# Patient Record
Sex: Female | Born: 1950 | Race: White | Hispanic: No | Smoking: Former smoker
Health system: Southern US, Community
[De-identification: ages and names within clinical notes are randomized; demographics above are authoritative.]

## PROBLEM LIST (undated history)

## (undated) DIAGNOSIS — R0602 Shortness of breath: Secondary | ICD-10-CM

## (undated) DIAGNOSIS — Z9221 Personal history of antineoplastic chemotherapy: Secondary | ICD-10-CM

## (undated) DIAGNOSIS — C3490 Malignant neoplasm of unspecified part of unspecified bronchus or lung: Secondary | ICD-10-CM

## (undated) DIAGNOSIS — R490 Dysphonia: Secondary | ICD-10-CM

## (undated) DIAGNOSIS — J449 Chronic obstructive pulmonary disease, unspecified: Secondary | ICD-10-CM

## (undated) DIAGNOSIS — R339 Retention of urine, unspecified: Secondary | ICD-10-CM

## (undated) DIAGNOSIS — K219 Gastro-esophageal reflux disease without esophagitis: Secondary | ICD-10-CM

## (undated) DIAGNOSIS — J189 Pneumonia, unspecified organism: Secondary | ICD-10-CM

## (undated) DIAGNOSIS — Z923 Personal history of irradiation: Secondary | ICD-10-CM

## (undated) HISTORY — DX: Personal history of antineoplastic chemotherapy: Z92.21

## (undated) HISTORY — DX: Personal history of irradiation: Z92.3

## (undated) HISTORY — PX: ABDOMINAL HYSTERECTOMY: SHX81

## (undated) HISTORY — DX: Chronic obstructive pulmonary disease, unspecified: J44.9

## (undated) HISTORY — PX: OTHER SURGICAL HISTORY: SHX169

---

## 1999-03-08 ENCOUNTER — Other Ambulatory Visit: Admission: RE | Admit: 1999-03-08 | Discharge: 1999-03-08 | Payer: Self-pay | Admitting: Gynecology

## 2001-02-12 ENCOUNTER — Encounter: Admission: RE | Admit: 2001-02-12 | Discharge: 2001-02-12 | Payer: Self-pay | Admitting: *Deleted

## 2001-04-17 ENCOUNTER — Other Ambulatory Visit: Admission: RE | Admit: 2001-04-17 | Discharge: 2001-04-17 | Payer: Self-pay | Admitting: Gynecology

## 2003-01-24 ENCOUNTER — Other Ambulatory Visit: Admission: RE | Admit: 2003-01-24 | Discharge: 2003-01-24 | Payer: Self-pay | Admitting: Gynecology

## 2003-01-30 ENCOUNTER — Encounter: Admission: RE | Admit: 2003-01-30 | Discharge: 2003-01-30 | Payer: Self-pay | Admitting: Gynecology

## 2003-01-30 ENCOUNTER — Encounter: Payer: Self-pay | Admitting: Gynecology

## 2009-03-25 ENCOUNTER — Encounter: Payer: Self-pay | Admitting: Internal Medicine

## 2009-04-03 ENCOUNTER — Ambulatory Visit: Payer: Self-pay | Admitting: Internal Medicine

## 2009-04-03 DIAGNOSIS — J449 Chronic obstructive pulmonary disease, unspecified: Secondary | ICD-10-CM

## 2009-04-03 DIAGNOSIS — R05 Cough: Secondary | ICD-10-CM

## 2009-04-03 DIAGNOSIS — J4489 Other specified chronic obstructive pulmonary disease: Secondary | ICD-10-CM | POA: Insufficient documentation

## 2009-07-27 ENCOUNTER — Encounter (INDEPENDENT_AMBULATORY_CARE_PROVIDER_SITE_OTHER): Payer: Self-pay | Admitting: *Deleted

## 2009-07-31 ENCOUNTER — Ambulatory Visit: Payer: Self-pay | Admitting: Internal Medicine

## 2009-08-04 ENCOUNTER — Telehealth: Payer: Self-pay | Admitting: Internal Medicine

## 2009-08-10 ENCOUNTER — Ambulatory Visit: Payer: Self-pay | Admitting: Internal Medicine

## 2009-08-10 ENCOUNTER — Encounter: Payer: Self-pay | Admitting: Internal Medicine

## 2009-08-11 ENCOUNTER — Encounter: Payer: Self-pay | Admitting: Internal Medicine

## 2009-09-21 ENCOUNTER — Encounter: Payer: Self-pay | Admitting: Internal Medicine

## 2009-09-22 ENCOUNTER — Ambulatory Visit (HOSPITAL_COMMUNITY): Admission: RE | Admit: 2009-09-22 | Discharge: 2009-09-22 | Payer: Self-pay | Admitting: Internal Medicine

## 2009-09-22 ENCOUNTER — Ambulatory Visit: Payer: Self-pay | Admitting: Internal Medicine

## 2009-09-28 ENCOUNTER — Ambulatory Visit: Payer: Self-pay | Admitting: Internal Medicine

## 2009-09-28 DIAGNOSIS — R222 Localized swelling, mass and lump, trunk: Secondary | ICD-10-CM | POA: Insufficient documentation

## 2009-09-30 ENCOUNTER — Ambulatory Visit: Payer: Self-pay | Admitting: Internal Medicine

## 2009-10-01 ENCOUNTER — Ambulatory Visit (HOSPITAL_COMMUNITY): Admission: RE | Admit: 2009-10-01 | Discharge: 2009-10-01 | Payer: Self-pay | Admitting: Internal Medicine

## 2009-10-05 LAB — COMPREHENSIVE METABOLIC PANEL
ALT: 17 U/L (ref 0–35)
AST: 16 U/L (ref 0–37)
Alkaline Phosphatase: 132 U/L — ABNORMAL HIGH (ref 39–117)
Calcium: 8.5 mg/dL (ref 8.4–10.5)
Chloride: 101 mEq/L (ref 96–112)
Sodium: 139 mEq/L (ref 135–145)

## 2009-10-05 LAB — CBC WITH DIFFERENTIAL/PLATELET
BASO%: 0.3 % (ref 0.0–2.0)
EOS%: 1.1 % (ref 0.0–7.0)
Eosinophils Absolute: 0.1 10*3/uL (ref 0.0–0.5)
MCV: 82.8 fL (ref 79.5–101.0)
MONO#: 0.7 10*3/uL (ref 0.1–0.9)
NEUT#: 6.4 10*3/uL (ref 1.5–6.5)
Platelets: 397 10*3/uL (ref 145–400)
RBC: 3.73 10*6/uL (ref 3.70–5.45)

## 2009-10-06 ENCOUNTER — Ambulatory Visit: Admission: RE | Admit: 2009-10-06 | Discharge: 2009-11-06 | Payer: Self-pay | Admitting: Radiation Oncology

## 2009-10-07 ENCOUNTER — Ambulatory Visit (HOSPITAL_COMMUNITY): Admission: RE | Admit: 2009-10-07 | Discharge: 2009-10-07 | Payer: Self-pay | Admitting: Internal Medicine

## 2009-10-19 LAB — CBC WITH DIFFERENTIAL/PLATELET
Basophils Absolute: 0 10*3/uL (ref 0.0–0.1)
EOS%: 1.7 % (ref 0.0–7.0)
Eosinophils Absolute: 0.1 10*3/uL (ref 0.0–0.5)
HGB: 10.5 g/dL — ABNORMAL LOW (ref 11.6–15.9)
LYMPH%: 19.2 % (ref 14.0–49.7)
MCH: 27.7 pg (ref 25.1–34.0)
MCHC: 34.2 g/dL (ref 31.5–36.0)
MONO#: 0.4 10*3/uL (ref 0.1–0.9)
NEUT#: 4.7 10*3/uL (ref 1.5–6.5)
NEUT%: 72.4 % (ref 38.4–76.8)
RBC: 3.79 10*6/uL (ref 3.70–5.45)
RDW: 13.2 % (ref 11.2–14.5)

## 2009-10-19 LAB — COMPREHENSIVE METABOLIC PANEL
Albumin: 3.4 g/dL — ABNORMAL LOW (ref 3.5–5.2)
Alkaline Phosphatase: 143 U/L — ABNORMAL HIGH (ref 39–117)
CO2: 28 mEq/L (ref 19–32)
Chloride: 101 mEq/L (ref 96–112)
Total Protein: 7.8 g/dL (ref 6.0–8.3)

## 2009-10-26 LAB — CBC WITH DIFFERENTIAL/PLATELET
BASO%: 0.2 % (ref 0.0–2.0)
Eosinophils Absolute: 0.1 10*3/uL (ref 0.0–0.5)
HGB: 10.7 g/dL — ABNORMAL LOW (ref 11.6–15.9)
MCV: 79.9 fL (ref 79.5–101.0)
MONO#: 0.5 10*3/uL (ref 0.1–0.9)
NEUT#: 4.8 10*3/uL (ref 1.5–6.5)
Platelets: 244 10*3/uL (ref 145–400)
RDW: 13.2 % (ref 11.2–14.5)
WBC: 6.1 10*3/uL (ref 3.9–10.3)

## 2009-11-05 ENCOUNTER — Ambulatory Visit: Payer: Self-pay | Admitting: Internal Medicine

## 2009-11-09 LAB — CBC WITH DIFFERENTIAL/PLATELET
Eosinophils Absolute: 0.1 10*3/uL (ref 0.0–0.5)
HCT: 33.2 % — ABNORMAL LOW (ref 34.8–46.6)
HGB: 11.3 g/dL — ABNORMAL LOW (ref 11.6–15.9)
MCV: 81.1 fL (ref 79.5–101.0)
MONO#: 0.4 10*3/uL (ref 0.1–0.9)
MONO%: 10.1 % (ref 0.0–14.0)
NEUT#: 3.1 10*3/uL (ref 1.5–6.5)
Platelets: 212 10*3/uL (ref 145–400)
WBC: 4 10*3/uL (ref 3.9–10.3)

## 2009-11-09 LAB — COMPREHENSIVE METABOLIC PANEL
Albumin: 3.6 g/dL (ref 3.5–5.2)
Alkaline Phosphatase: 146 U/L — ABNORMAL HIGH (ref 39–117)
BUN: 15 mg/dL (ref 6–23)
CO2: 22 mEq/L (ref 19–32)
Creatinine, Ser: 0.79 mg/dL (ref 0.40–1.20)
Glucose, Bld: 83 mg/dL (ref 70–99)
Total Bilirubin: 0.3 mg/dL (ref 0.3–1.2)
Total Protein: 7 g/dL (ref 6.0–8.3)

## 2009-11-11 ENCOUNTER — Encounter: Payer: Self-pay | Admitting: Internal Medicine

## 2009-11-23 LAB — CBC WITH DIFFERENTIAL/PLATELET
Basophils Absolute: 0 10*3/uL (ref 0.0–0.1)
Eosinophils Absolute: 0.1 10*3/uL (ref 0.0–0.5)
HCT: 32.5 % — ABNORMAL LOW (ref 34.8–46.6)
HGB: 11.2 g/dL — ABNORMAL LOW (ref 11.6–15.9)
MCV: 81.7 fL (ref 79.5–101.0)
MONO%: 10.8 % (ref 0.0–14.0)
NEUT#: 2.8 10*3/uL (ref 1.5–6.5)
RDW: 16.5 % — ABNORMAL HIGH (ref 11.2–14.5)

## 2009-11-23 LAB — COMPREHENSIVE METABOLIC PANEL
Albumin: 3.3 g/dL — ABNORMAL LOW (ref 3.5–5.2)
BUN: 13 mg/dL (ref 6–23)
Calcium: 9.1 mg/dL (ref 8.4–10.5)
Chloride: 105 mEq/L (ref 96–112)
Glucose, Bld: 98 mg/dL (ref 70–99)
Potassium: 3.7 mEq/L (ref 3.5–5.3)

## 2009-12-16 ENCOUNTER — Ambulatory Visit (HOSPITAL_COMMUNITY): Admission: RE | Admit: 2009-12-16 | Discharge: 2009-12-16 | Payer: Self-pay | Admitting: Internal Medicine

## 2009-12-17 ENCOUNTER — Ambulatory Visit: Payer: Self-pay | Admitting: Internal Medicine

## 2009-12-21 LAB — COMPREHENSIVE METABOLIC PANEL
ALT: 19 U/L (ref 0–35)
AST: 21 U/L (ref 0–37)
Albumin: 4.1 g/dL (ref 3.5–5.2)
Alkaline Phosphatase: 180 U/L — ABNORMAL HIGH (ref 39–117)
BUN: 17 mg/dL (ref 6–23)
CO2: 24 mEq/L (ref 19–32)
Chloride: 101 mEq/L (ref 96–112)
Creatinine, Ser: 0.85 mg/dL (ref 0.40–1.20)
Glucose, Bld: 86 mg/dL (ref 70–99)
Potassium: 4.4 mEq/L (ref 3.5–5.3)
Sodium: 137 mEq/L (ref 135–145)
Total Bilirubin: 0.4 mg/dL (ref 0.3–1.2)

## 2009-12-21 LAB — CBC WITH DIFFERENTIAL/PLATELET
BASO%: 0.3 % (ref 0.0–2.0)
HCT: 34.2 % — ABNORMAL LOW (ref 34.8–46.6)
MCH: 28.5 pg (ref 25.1–34.0)
MCHC: 34.5 g/dL (ref 31.5–36.0)
NEUT#: 3 10*3/uL (ref 1.5–6.5)
Platelets: 230 10*3/uL (ref 145–400)
RBC: 4.14 10*6/uL (ref 3.70–5.45)
RDW: 16.3 % — ABNORMAL HIGH (ref 11.2–14.5)

## 2010-01-18 ENCOUNTER — Ambulatory Visit: Payer: Self-pay | Admitting: Internal Medicine

## 2010-01-20 LAB — CBC WITH DIFFERENTIAL/PLATELET
BASO%: 0.3 % (ref 0.0–2.0)
Basophils Absolute: 0 10*3/uL (ref 0.0–0.1)
EOS%: 3.2 % (ref 0.0–7.0)
HGB: 12.2 g/dL (ref 11.6–15.9)
LYMPH%: 24.2 % (ref 14.0–49.7)
MCV: 84.4 fL (ref 79.5–101.0)
MONO%: 9.2 % (ref 0.0–14.0)
RDW: 15.5 % — ABNORMAL HIGH (ref 11.2–14.5)

## 2010-01-20 LAB — COMPREHENSIVE METABOLIC PANEL
ALT: 19 U/L (ref 0–35)
AST: 23 U/L (ref 0–37)
Albumin: 4.1 g/dL (ref 3.5–5.2)
Alkaline Phosphatase: 180 U/L — ABNORMAL HIGH (ref 39–117)
Calcium: 9.2 mg/dL (ref 8.4–10.5)
Creatinine, Ser: 0.89 mg/dL (ref 0.40–1.20)
Total Protein: 7 g/dL (ref 6.0–8.3)

## 2010-02-17 ENCOUNTER — Ambulatory Visit: Payer: Self-pay | Admitting: Internal Medicine

## 2010-02-17 LAB — COMPREHENSIVE METABOLIC PANEL
ALT: 25 U/L (ref 0–35)
AST: 30 U/L (ref 0–37)
Alkaline Phosphatase: 204 U/L — ABNORMAL HIGH (ref 39–117)
BUN: 12 mg/dL (ref 6–23)
Chloride: 103 mEq/L (ref 96–112)
Creatinine, Ser: 0.8 mg/dL (ref 0.40–1.20)
Glucose, Bld: 87 mg/dL (ref 70–99)
Potassium: 4.1 mEq/L (ref 3.5–5.3)
Sodium: 139 mEq/L (ref 135–145)
Total Bilirubin: 0.4 mg/dL (ref 0.3–1.2)

## 2010-02-17 LAB — CBC WITH DIFFERENTIAL/PLATELET
Basophils Absolute: 0 10*3/uL (ref 0.0–0.1)
Eosinophils Absolute: 0.1 10*3/uL (ref 0.0–0.5)
LYMPH%: 22.1 % (ref 14.0–49.7)
MCH: 29.2 pg (ref 25.1–34.0)
MCHC: 34.7 g/dL (ref 31.5–36.0)
MCV: 84.2 fL (ref 79.5–101.0)
NEUT%: 67.1 % (ref 38.4–76.8)
RDW: 14.4 % (ref 11.2–14.5)
WBC: 4.4 10*3/uL (ref 3.9–10.3)

## 2010-03-15 ENCOUNTER — Ambulatory Visit (HOSPITAL_COMMUNITY): Admission: RE | Admit: 2010-03-15 | Discharge: 2010-03-15 | Payer: Self-pay | Admitting: Internal Medicine

## 2010-03-18 ENCOUNTER — Ambulatory Visit: Admission: RE | Admit: 2010-03-18 | Discharge: 2010-05-27 | Payer: Self-pay | Admitting: Radiation Oncology

## 2010-03-18 LAB — COMPREHENSIVE METABOLIC PANEL
Albumin: 4.1 g/dL (ref 3.5–5.2)
Alkaline Phosphatase: 206 U/L — ABNORMAL HIGH (ref 39–117)
CO2: 23 mEq/L (ref 19–32)
Calcium: 8.5 mg/dL (ref 8.4–10.5)
Chloride: 102 mEq/L (ref 96–112)
Creatinine, Ser: 0.82 mg/dL (ref 0.40–1.20)
Potassium: 4.7 mEq/L (ref 3.5–5.3)
Total Bilirubin: 0.5 mg/dL (ref 0.3–1.2)

## 2010-03-18 LAB — CBC WITH DIFFERENTIAL/PLATELET
BASO%: 0.2 % (ref 0.0–2.0)
EOS%: 1.8 % (ref 0.0–7.0)
MCV: 85.5 fL (ref 79.5–101.0)
MONO%: 11 % (ref 0.0–14.0)
NEUT#: 3.6 10*3/uL (ref 1.5–6.5)
NEUT%: 70.3 % (ref 38.4–76.8)
Platelets: 228 10*3/uL (ref 145–400)
RBC: 4.19 10*6/uL (ref 3.70–5.45)
RDW: 13.9 % (ref 11.2–14.5)
lymph#: 0.9 10*3/uL (ref 0.9–3.3)

## 2010-03-24 ENCOUNTER — Ambulatory Visit: Payer: Self-pay | Admitting: Internal Medicine

## 2010-03-30 ENCOUNTER — Ambulatory Visit: Admission: RE | Admit: 2010-03-30 | Discharge: 2010-03-30 | Payer: Self-pay | Admitting: Internal Medicine

## 2010-03-30 ENCOUNTER — Ambulatory Visit: Payer: Self-pay | Admitting: Internal Medicine

## 2010-04-13 ENCOUNTER — Ambulatory Visit: Payer: Self-pay | Admitting: Internal Medicine

## 2010-04-15 LAB — COMPREHENSIVE METABOLIC PANEL
Albumin: 4.1 g/dL (ref 3.5–5.2)
Alkaline Phosphatase: 200 U/L — ABNORMAL HIGH (ref 39–117)
BUN: 17 mg/dL (ref 6–23)
Creatinine, Ser: 0.86 mg/dL (ref 0.40–1.20)
Total Bilirubin: 0.6 mg/dL (ref 0.3–1.2)
Total Protein: 6.9 g/dL (ref 6.0–8.3)

## 2010-04-15 LAB — CBC WITH DIFFERENTIAL/PLATELET
Basophils Absolute: 0 10*3/uL (ref 0.0–0.1)
EOS%: 2.1 % (ref 0.0–7.0)
HCT: 36.4 % (ref 34.8–46.6)
MCH: 29.3 pg (ref 25.1–34.0)
MCV: 86.7 fL (ref 79.5–101.0)
MONO#: 0.5 10*3/uL (ref 0.1–0.9)
NEUT%: 68.9 % (ref 38.4–76.8)
Platelets: 236 10*3/uL (ref 145–400)
RDW: 13.4 % (ref 11.2–14.5)
lymph#: 0.9 10*3/uL (ref 0.9–3.3)

## 2010-05-13 ENCOUNTER — Ambulatory Visit: Payer: Self-pay | Admitting: Internal Medicine

## 2010-05-13 LAB — CBC WITH DIFFERENTIAL/PLATELET
Basophils Absolute: 0 10*3/uL (ref 0.0–0.1)
EOS%: 2.3 % (ref 0.0–7.0)
Eosinophils Absolute: 0.1 10*3/uL (ref 0.0–0.5)
LYMPH%: 20.7 % (ref 14.0–49.7)
MCH: 30.2 pg (ref 25.1–34.0)
MCV: 87.1 fL (ref 79.5–101.0)
MONO#: 0.4 10*3/uL (ref 0.1–0.9)
NEUT#: 3.2 10*3/uL (ref 1.5–6.5)
Platelets: 243 10*3/uL (ref 145–400)
RDW: 13.1 % (ref 11.2–14.5)
lymph#: 1 10*3/uL (ref 0.9–3.3)

## 2010-05-13 LAB — COMPREHENSIVE METABOLIC PANEL
ALT: 20 U/L (ref 0–35)
AST: 24 U/L (ref 0–37)
Albumin: 4.5 g/dL (ref 3.5–5.2)
Alkaline Phosphatase: 227 U/L — ABNORMAL HIGH (ref 39–117)
CO2: 20 mEq/L (ref 19–32)
Chloride: 104 mEq/L (ref 96–112)
Total Bilirubin: 0.5 mg/dL (ref 0.3–1.2)

## 2010-06-11 ENCOUNTER — Ambulatory Visit (HOSPITAL_COMMUNITY): Admission: RE | Admit: 2010-06-11 | Discharge: 2010-06-11 | Payer: Self-pay | Admitting: Internal Medicine

## 2010-06-15 ENCOUNTER — Ambulatory Visit: Payer: Self-pay | Admitting: Internal Medicine

## 2010-06-15 LAB — CBC WITH DIFFERENTIAL/PLATELET
BASO%: 0.3 % (ref 0.0–2.0)
Basophils Absolute: 0 10e3/uL (ref 0.0–0.1)
EOS%: 1.8 % (ref 0.0–7.0)
Eosinophils Absolute: 0.1 10e3/uL (ref 0.0–0.5)
HCT: 39.6 % (ref 34.8–46.6)
HGB: 13.4 g/dL (ref 11.6–15.9)
LYMPH%: 20.4 % (ref 14.0–49.7)
MCH: 30 pg (ref 25.1–34.0)
MCHC: 33.9 g/dL (ref 31.5–36.0)
MCV: 88.5 fL (ref 79.5–101.0)
MONO#: 0.4 10e3/uL (ref 0.1–0.9)
MONO%: 9.4 % (ref 0.0–14.0)
NEUT#: 3.2 10e3/uL (ref 1.5–6.5)
NEUT%: 68.1 % (ref 38.4–76.8)
Platelets: 210 10e3/uL (ref 145–400)
RBC: 4.47 10e6/uL (ref 3.70–5.45)
RDW: 13.2 % (ref 11.2–14.5)
WBC: 4.7 10e3/uL (ref 3.9–10.3)
lymph#: 1 10e3/uL (ref 0.9–3.3)

## 2010-06-15 LAB — COMPREHENSIVE METABOLIC PANEL
ALT: 16 U/L (ref 0–35)
AST: 20 U/L (ref 0–37)
Albumin: 4.1 g/dL (ref 3.5–5.2)
Glucose, Bld: 93 mg/dL (ref 70–99)
Total Protein: 7 g/dL (ref 6.0–8.3)

## 2010-07-14 LAB — CBC WITH DIFFERENTIAL/PLATELET
Eosinophils Absolute: 0.1 10*3/uL (ref 0.0–0.5)
HGB: 12.5 g/dL (ref 11.6–15.9)
LYMPH%: 15.9 % (ref 14.0–49.7)
MCH: 30.6 pg (ref 25.1–34.0)
MCHC: 35.1 g/dL (ref 31.5–36.0)
MCV: 87.2 fL (ref 79.5–101.0)
MONO#: 0.4 10*3/uL (ref 0.1–0.9)
MONO%: 7.3 % (ref 0.0–14.0)
NEUT#: 3.7 10*3/uL (ref 1.5–6.5)
NEUT%: 74.9 % (ref 38.4–76.8)
RBC: 4.07 10*6/uL (ref 3.70–5.45)

## 2010-07-14 LAB — COMPREHENSIVE METABOLIC PANEL
ALT: 17 U/L (ref 0–35)
Alkaline Phosphatase: 201 U/L — ABNORMAL HIGH (ref 39–117)
Total Bilirubin: 0.3 mg/dL (ref 0.3–1.2)
Total Protein: 6.7 g/dL (ref 6.0–8.3)

## 2010-08-11 ENCOUNTER — Encounter: Payer: Self-pay | Admitting: Internal Medicine

## 2010-08-11 ENCOUNTER — Other Ambulatory Visit: Payer: Self-pay | Admitting: Internal Medicine

## 2010-08-11 LAB — CBC WITH DIFFERENTIAL/PLATELET
Basophils Absolute: 0 10*3/uL (ref 0.0–0.1)
Eosinophils Absolute: 0.1 10*3/uL (ref 0.0–0.5)
HGB: 12.9 g/dL (ref 11.6–15.9)
NEUT#: 8.3 10*3/uL — ABNORMAL HIGH (ref 1.5–6.5)
RDW: 12.7 % (ref 11.2–14.5)
lymph#: 0.8 10*3/uL — ABNORMAL LOW (ref 0.9–3.3)

## 2010-08-11 LAB — COMPREHENSIVE METABOLIC PANEL
AST: 27 U/L (ref 0–37)
Albumin: 4.3 g/dL (ref 3.5–5.2)
BUN: 15 mg/dL (ref 6–23)
Calcium: 9.4 mg/dL (ref 8.4–10.5)
Chloride: 100 mEq/L (ref 96–112)
Glucose, Bld: 96 mg/dL (ref 70–99)
Potassium: 4.3 mEq/L (ref 3.5–5.3)

## 2010-09-02 ENCOUNTER — Ambulatory Visit (HOSPITAL_COMMUNITY)
Admission: RE | Admit: 2010-09-02 | Discharge: 2010-09-02 | Payer: Self-pay | Source: Home / Self Care | Admitting: Internal Medicine

## 2010-09-06 ENCOUNTER — Other Ambulatory Visit: Payer: Self-pay | Admitting: Internal Medicine

## 2010-09-06 ENCOUNTER — Encounter: Payer: Self-pay | Admitting: Internal Medicine

## 2010-09-06 LAB — COMPREHENSIVE METABOLIC PANEL
ALT: 22 U/L (ref 0–35)
AST: 28 U/L (ref 0–37)
Albumin: 3.6 g/dL (ref 3.5–5.2)
BUN: 12 mg/dL (ref 6–23)
Calcium: 9.3 mg/dL (ref 8.4–10.5)
Chloride: 104 mEq/L (ref 96–112)
Potassium: 4 mEq/L (ref 3.5–5.3)
Sodium: 140 mEq/L (ref 135–145)
Total Protein: 7 g/dL (ref 6.0–8.3)

## 2010-09-06 LAB — CBC WITH DIFFERENTIAL/PLATELET
Basophils Absolute: 0 10*3/uL (ref 0.0–0.1)
EOS%: 1.3 % (ref 0.0–7.0)
HGB: 13.4 g/dL (ref 11.6–15.9)
MCH: 30 pg (ref 25.1–34.0)
NEUT#: 2.9 10*3/uL (ref 1.5–6.5)
RBC: 4.48 10*6/uL (ref 3.70–5.45)
RDW: 12.7 % (ref 11.2–14.5)
lymph#: 1 10*3/uL (ref 0.9–3.3)

## 2010-10-07 ENCOUNTER — Ambulatory Visit: Payer: Self-pay | Admitting: Internal Medicine

## 2010-10-11 LAB — CBC WITH DIFFERENTIAL/PLATELET
BASO%: 0.3 % (ref 0.0–2.0)
Basophils Absolute: 0 10*3/uL (ref 0.0–0.1)
EOS%: 1 % (ref 0.0–7.0)
Eosinophils Absolute: 0 10*3/uL (ref 0.0–0.5)
HCT: 36.6 % (ref 34.8–46.6)
HGB: 12.5 g/dL (ref 11.6–15.9)
LYMPH%: 22.8 % (ref 14.0–49.7)
MCH: 29.9 pg (ref 25.1–34.0)
MCHC: 34.2 g/dL (ref 31.5–36.0)
MCV: 87.4 fL (ref 79.5–101.0)
MONO#: 0.3 10*3/uL (ref 0.1–0.9)
MONO%: 7.4 % (ref 0.0–14.0)
NEUT#: 3 10*3/uL (ref 1.5–6.5)
NEUT%: 68.5 % (ref 38.4–76.8)
Platelets: 234 10*3/uL (ref 145–400)
RBC: 4.19 10*6/uL (ref 3.70–5.45)
RDW: 13.3 % (ref 11.2–14.5)
WBC: 4.4 10*3/uL (ref 3.9–10.3)
lymph#: 1 10*3/uL (ref 0.9–3.3)

## 2010-10-11 LAB — COMPREHENSIVE METABOLIC PANEL
ALT: 18 U/L (ref 0–35)
AST: 27 U/L (ref 0–37)
Albumin: 4.4 g/dL (ref 3.5–5.2)
Alkaline Phosphatase: 209 U/L — ABNORMAL HIGH (ref 39–117)
BUN: 14 mg/dL (ref 6–23)
CO2: 22 mEq/L (ref 19–32)
Calcium: 9.4 mg/dL (ref 8.4–10.5)
Chloride: 104 mEq/L (ref 96–112)
Creatinine, Ser: 0.89 mg/dL (ref 0.40–1.20)
Glucose, Bld: 82 mg/dL (ref 70–99)
Potassium: 4.4 mEq/L (ref 3.5–5.3)
Sodium: 138 mEq/L (ref 135–145)
Total Bilirubin: 0.5 mg/dL (ref 0.3–1.2)
Total Protein: 7.4 g/dL (ref 6.0–8.3)

## 2010-11-02 NOTE — Letter (Signed)
Summary: [facility]  [facility]   Imported By: Lennie Odor 08/27/2010 13:42:56  _____________________________________________________________________  External Attachment:    Type:   Image     Comment:   External Document

## 2010-11-02 NOTE — Op Note (Signed)
Summary: [facility]  [facility]   Imported By: Sherian Rein 11/30/2009 09:15:05  _____________________________________________________________________  External Attachment:    Type:   Image     Comment:   External Document

## 2010-11-02 NOTE — Assessment & Plan Note (Signed)
Summary: Pulmonary/ ext f/u ov   Copy to:  Dr. Lazarus Salines Primary Provider/Referring Provider:  Dr. Creola Corn  CC:  Followup per Dr Michell Heinrich.  Pt had CT Chest x 1 wk ago which showed rt pneumothorax.  Pt states that she has overall been doing well with her breathing.  She does have some dry cough in the am- sometimes produces minimal clear sputum.  Marland Kitchen  History of Present Illness: 27 yowf with minimal smoking quit quit 2002 with cough that resolved and no limiting sob  Onset Oct 2009 cough rattling in chest, no sputum production  then seen Nov with cxr nl then ENT eval by [facility] including sinus ct all normal and rec ppi two times a day ac one week before first ov April 03, 2009 with ? slt improvment.   Main c/o @ ov = sense of throat congestion occurs about 1 hour after meals, worse when supine, occ with cough, dry then wakes up with rattling around 230am. assoc with episodic sob not necessarily proportionate to activity.  Cxr 01/2009 HP wnl so rx as GERD with f/u if nott 100% > states cough resolved and did not feel further pulmonary eval needed  Then indolent onset worsening R lower ant cp in October 2010 evetually sought Dr Christena Deem help and dx'd with right lung mass/effusion > FOB 12/21 pos adenoca with 50% obstruction.  September 28, 2009 ov for discussion of FOB findings.  Main  c/o is right ant cp with deep breath > rec RT and onc eval (see pmhx for outline of rx)  March 24, 2010 ov with no more cp but doe x yardwork or long walks (up to 3 miles without stopping) no cough or am congestion.  Pt denies any significant sore throat, dysphagia, itching, sneezing,  nasal congestion or excess secretions,  fever, chills, sweats, unintended wt loss, pleuritic or exertional cp, hempoptysis, change in activity tolerance  orthopnea pnd or leg swelling. Pt also denies any obvious fluctuation in symptoms with weather or environmental change or other alleviating or aggravating factors.           Current  Medications (verified): 1)  Famotidine 10 Mg Tabs (Famotidine) .Marland Kitchen.. 1 Once Daily 2)  Ibuprofen 200 Mg Tabs (Ibuprofen) .... As Directed On Bottle As Needed 3)  Alprazolam 0.5 Mg Tabs (Alprazolam) .... 1/2 To 1 Two Times A Day As Needed 4)  Colace 100 Mg Caps (Docusate Sodium) .... 2 Once Daily 5)  Ferrous Sulfate 325 (65 Fe) Mg Tabs (Ferrous Sulfate) .... 2 Once Daily  Allergies (verified): 1)  ! Sulfa  Past History:  Past Medical History: COPD per [place] 2006 Lung Mass with Right Pleural effusion     - FOB 09/22/09 concentric 50% obstruction rmsb pos adenoca     - PET ordered September 28, 2009      - RT complete Jan 2011     - Maint Tarceva Oct 30 2009 >>>  Family History: Reviewed history from 04/03/2009 and no changes required. Colon CA- Father  Social History: Reviewed history from 04/03/2009 and no changes required. Divorced Designer, television/film set ETOH occ Former smoker.  Quit in 2002.  Smoked 1/2 ppd x 4 years.  Vital Signs:  Patient profile:   60 year old female Weight:      176 pounds BMI:     30.32 O2 Sat:      100 % on Room air Temp:     97.1 degrees F oral Pulse rate:   97 /  minute BP sitting:   120 / 80  (left arm)  Vitals Entered By: Vernie Murders (March 24, 2010 2:10 PM)  O2 Flow:  Room air  Physical Exam  Additional Exam:  wt 186  April 03 2009 > 174 September 28, 2009 > 176 March 25, 2010  amb wf nad HEENT: nl dentition, turbinates, and orophanx. Nl external ear canals without cough reflex NECK :  without JVD/Nodes/TM/ nl carotid upstrokes bilaterally LUNGS: no acc muscle use,  minimal decrease bs right , no localized wheeze  without cough on insp or exp maneuvers CV:  RRR  no s3 or murmur or increase in P2, no edema  ABD:  soft and nontender with nl excursion in the supine position. No bruits or organomegaly, bowel sounds nl pos hoover's at end insp MS:  warm without deformities, calf tenderness, cyanosis or clubbing SKIN: warm and dry without  lesions   NEURO:  alert, approp, no deficits     CT of Chest  Procedure date:  03/15/2010  Findings:      complete atx right lung  Impression & Recommendations:  Problem # 1:  SWELLING/MASS/LUMP IN CHEST (ICD-786.6)  The minimal symptoms she's having suggest to me this is not an acute obstruction and it well not be possible to re-inflate the right lung at this point regardless of findings but ddx includes recurrent tumor vs rad fibrosis, doubt mucus plug.  Discussed in detail all the  indications, usual  risks and alternatives  relative to the benefits with patient who agrees to proceed with bronchoscopy with biopsy if needed  Orders: Est. Patient Level IV ([address])  Medications Added to Medication List This Visit: 1)  Famotidine 10 Mg Tabs (Famotidine) .Marland Kitchen.. 1 once daily 2)  Colace 100 Mg Caps (Docusate sodium) .... 2 once daily 3)  Ferrous Sulfate 325 (65 Fe) Mg Tabs (Ferrous sulfate) .... 2 once daily  Patient Instructions: 1)  Bronchoscopy sceduled for Tuesday at 830 so nothing eat or drink after midnight on Monday and go to outpatient registration at 730 am

## 2010-11-04 NOTE — Letter (Signed)
Summary: [facility]  [facility]   Imported By: Lester [place] 09/14/2010 10:24:05  _____________________________________________________________________  External Attachment:    Type:   Image     Comment:   External Document

## 2010-11-09 ENCOUNTER — Other Ambulatory Visit: Payer: Self-pay | Admitting: Internal Medicine

## 2010-11-09 ENCOUNTER — Encounter (HOSPITAL_BASED_OUTPATIENT_CLINIC_OR_DEPARTMENT_OTHER): Payer: Medicare HMO | Admitting: Internal Medicine

## 2010-11-09 DIAGNOSIS — C3430 Malignant neoplasm of lower lobe, unspecified bronchus or lung: Secondary | ICD-10-CM

## 2010-11-09 DIAGNOSIS — Z85118 Personal history of other malignant neoplasm of bronchus and lung: Secondary | ICD-10-CM

## 2010-11-09 DIAGNOSIS — K5900 Constipation, unspecified: Secondary | ICD-10-CM

## 2010-11-09 LAB — COMPREHENSIVE METABOLIC PANEL
AST: 28 U/L (ref 0–37)
Albumin: 3.6 g/dL (ref 3.5–5.2)
Creatinine, Ser: 0.92 mg/dL (ref 0.40–1.20)
Glucose, Bld: 93 mg/dL (ref 70–99)
Potassium: 4.2 mEq/L (ref 3.5–5.3)
Total Bilirubin: 0.6 mg/dL (ref 0.3–1.2)

## 2010-11-09 LAB — CBC WITH DIFFERENTIAL/PLATELET
Basophils Absolute: 0 10*3/uL (ref 0.0–0.1)
LYMPH%: 16.4 % (ref 14.0–49.7)
MCHC: 34.1 g/dL (ref 31.5–36.0)
NEUT#: 4 10*3/uL (ref 1.5–6.5)
Platelets: 264 10*3/uL (ref 145–400)
RDW: 13.4 % (ref 11.2–14.5)
WBC: 5.4 10*3/uL (ref 3.9–10.3)
lymph#: 0.9 10*3/uL (ref 0.9–3.3)

## 2010-12-01 ENCOUNTER — Encounter (HOSPITAL_COMMUNITY): Payer: Self-pay

## 2010-12-01 ENCOUNTER — Ambulatory Visit (HOSPITAL_COMMUNITY)
Admission: RE | Admit: 2010-12-01 | Discharge: 2010-12-01 | Disposition: A | Discharge status: Home / Self Care | Payer: Medicare HMO | Source: Ambulatory Visit | Attending: Internal Medicine | Admitting: Internal Medicine

## 2010-12-01 DIAGNOSIS — Z9079 Acquired absence of other genital organ(s): Secondary | ICD-10-CM | POA: Insufficient documentation

## 2010-12-01 DIAGNOSIS — Z79899 Other long term (current) drug therapy: Secondary | ICD-10-CM | POA: Insufficient documentation

## 2010-12-01 DIAGNOSIS — J9383 Other pneumothorax: Secondary | ICD-10-CM | POA: Insufficient documentation

## 2010-12-01 DIAGNOSIS — Z85118 Personal history of other malignant neoplasm of bronchus and lung: Secondary | ICD-10-CM

## 2010-12-01 DIAGNOSIS — K449 Diaphragmatic hernia without obstruction or gangrene: Secondary | ICD-10-CM | POA: Insufficient documentation

## 2010-12-01 DIAGNOSIS — C7952 Secondary malignant neoplasm of bone marrow: Secondary | ICD-10-CM | POA: Insufficient documentation

## 2010-12-01 DIAGNOSIS — C7951 Secondary malignant neoplasm of bone: Secondary | ICD-10-CM | POA: Insufficient documentation

## 2010-12-01 DIAGNOSIS — C3490 Malignant neoplasm of unspecified part of unspecified bronchus or lung: Secondary | ICD-10-CM | POA: Insufficient documentation

## 2010-12-01 DIAGNOSIS — Z923 Personal history of irradiation: Secondary | ICD-10-CM | POA: Insufficient documentation

## 2010-12-01 DIAGNOSIS — M47814 Spondylosis without myelopathy or radiculopathy, thoracic region: Secondary | ICD-10-CM | POA: Insufficient documentation

## 2010-12-01 DIAGNOSIS — I700 Atherosclerosis of aorta: Secondary | ICD-10-CM | POA: Insufficient documentation

## 2010-12-01 DIAGNOSIS — M47817 Spondylosis without myelopathy or radiculopathy, lumbosacral region: Secondary | ICD-10-CM | POA: Insufficient documentation

## 2010-12-01 HISTORY — DX: Malignant neoplasm of unspecified part of unspecified bronchus or lung: C34.90

## 2010-12-01 MED ORDER — IOHEXOL 300 MG/ML  SOLN
100.0000 mL | Freq: Once | INTRAMUSCULAR | Status: AC | PRN
  Administered 2010-12-01: 100 mL via INTRAVENOUS

## 2010-12-07 ENCOUNTER — Other Ambulatory Visit: Payer: Self-pay | Admitting: Internal Medicine

## 2010-12-07 ENCOUNTER — Encounter (HOSPITAL_BASED_OUTPATIENT_CLINIC_OR_DEPARTMENT_OTHER): Payer: Medicare HMO | Admitting: Internal Medicine

## 2010-12-07 DIAGNOSIS — C3430 Malignant neoplasm of lower lobe, unspecified bronchus or lung: Secondary | ICD-10-CM

## 2010-12-07 DIAGNOSIS — K5900 Constipation, unspecified: Secondary | ICD-10-CM

## 2010-12-07 LAB — CBC WITH DIFFERENTIAL/PLATELET
BASO%: 0.3 % (ref 0.0–2.0)
Eosinophils Absolute: 0.1 10*3/uL (ref 0.0–0.5)
LYMPH%: 22.7 % (ref 14.0–49.7)
MCHC: 34.8 g/dL (ref 31.5–36.0)
MONO#: 0.4 10*3/uL (ref 0.1–0.9)
MONO%: 9.3 % (ref 0.0–14.0)
NEUT#: 3.1 10*3/uL (ref 1.5–6.5)
Platelets: 205 10*3/uL (ref 145–400)
RBC: 4.14 10*6/uL (ref 3.70–5.45)
RDW: 13.1 % (ref 11.2–14.5)
WBC: 4.6 10*3/uL (ref 3.9–10.3)

## 2010-12-07 LAB — COMPREHENSIVE METABOLIC PANEL
ALT: 20 U/L (ref 0–35)
Albumin: 4.3 g/dL (ref 3.5–5.2)
Alkaline Phosphatase: 174 U/L — ABNORMAL HIGH (ref 39–117)
CO2: 20 mEq/L (ref 19–32)
Potassium: 3.8 mEq/L (ref 3.5–5.3)
Sodium: 137 mEq/L (ref 135–145)
Total Bilirubin: 0.4 mg/dL (ref 0.3–1.2)
Total Protein: 7.1 g/dL (ref 6.0–8.3)

## 2011-01-12 ENCOUNTER — Other Ambulatory Visit: Payer: Self-pay | Admitting: Internal Medicine

## 2011-01-12 ENCOUNTER — Encounter (HOSPITAL_BASED_OUTPATIENT_CLINIC_OR_DEPARTMENT_OTHER): Payer: Medicare HMO | Admitting: Internal Medicine

## 2011-01-12 DIAGNOSIS — K5900 Constipation, unspecified: Secondary | ICD-10-CM

## 2011-01-12 DIAGNOSIS — Z5111 Encounter for antineoplastic chemotherapy: Secondary | ICD-10-CM

## 2011-01-12 DIAGNOSIS — C3430 Malignant neoplasm of lower lobe, unspecified bronchus or lung: Secondary | ICD-10-CM

## 2011-01-12 LAB — CBC WITH DIFFERENTIAL/PLATELET
BASO%: 0.3 % (ref 0.0–2.0)
EOS%: 1.6 % (ref 0.0–7.0)
HCT: 36.4 % (ref 34.8–46.6)
LYMPH%: 21.7 % (ref 14.0–49.7)
MCH: 30.1 pg (ref 25.1–34.0)
MCHC: 34.1 g/dL (ref 31.5–36.0)
MONO%: 9.7 % (ref 0.0–14.0)
NEUT%: 66.7 % (ref 38.4–76.8)
Platelets: 214 10*3/uL (ref 145–400)
RBC: 4.14 10*6/uL (ref 3.70–5.45)

## 2011-01-12 LAB — COMPREHENSIVE METABOLIC PANEL
ALT: 23 U/L (ref 0–35)
AST: 29 U/L (ref 0–37)
Alkaline Phosphatase: 186 U/L — ABNORMAL HIGH (ref 39–117)
Creatinine, Ser: 1 mg/dL (ref 0.40–1.20)
Total Bilirubin: 0.4 mg/dL (ref 0.3–1.2)

## 2011-02-09 ENCOUNTER — Other Ambulatory Visit: Payer: Self-pay | Admitting: Internal Medicine

## 2011-02-09 ENCOUNTER — Encounter (HOSPITAL_BASED_OUTPATIENT_CLINIC_OR_DEPARTMENT_OTHER): Payer: Medicare HMO | Admitting: Internal Medicine

## 2011-02-09 DIAGNOSIS — C3430 Malignant neoplasm of lower lobe, unspecified bronchus or lung: Secondary | ICD-10-CM

## 2011-02-09 DIAGNOSIS — K5900 Constipation, unspecified: Secondary | ICD-10-CM

## 2011-02-09 DIAGNOSIS — C3490 Malignant neoplasm of unspecified part of unspecified bronchus or lung: Secondary | ICD-10-CM

## 2011-02-09 LAB — CBC WITH DIFFERENTIAL/PLATELET
BASO%: 0.3 % (ref 0.0–2.0)
HCT: 35.4 % (ref 34.8–46.6)
LYMPH%: 20.5 % (ref 14.0–49.7)
MCHC: 34.6 g/dL (ref 31.5–36.0)
MCV: 87.8 fL (ref 79.5–101.0)
MONO#: 0.5 10*3/uL (ref 0.1–0.9)
NEUT%: 67.6 % (ref 38.4–76.8)
Platelets: 241 10*3/uL (ref 145–400)
WBC: 4.6 10*3/uL (ref 3.9–10.3)

## 2011-02-09 LAB — COMPREHENSIVE METABOLIC PANEL
ALT: 21 U/L (ref 0–35)
CO2: 26 mEq/L (ref 19–32)
Creatinine, Ser: 0.86 mg/dL (ref 0.40–1.20)
Glucose, Bld: 94 mg/dL (ref 70–99)
Total Bilirubin: 0.3 mg/dL (ref 0.3–1.2)

## 2011-03-11 ENCOUNTER — Ambulatory Visit (HOSPITAL_COMMUNITY)
Admission: RE | Admit: 2011-03-11 | Discharge: 2011-03-11 | Disposition: A | Discharge status: Home / Self Care | Payer: Managed Care, Other (non HMO) | Source: Ambulatory Visit | Attending: Internal Medicine | Admitting: Internal Medicine

## 2011-03-11 DIAGNOSIS — C3490 Malignant neoplasm of unspecified part of unspecified bronchus or lung: Secondary | ICD-10-CM | POA: Insufficient documentation

## 2011-03-11 DIAGNOSIS — Z90710 Acquired absence of both cervix and uterus: Secondary | ICD-10-CM | POA: Insufficient documentation

## 2011-03-11 DIAGNOSIS — K449 Diaphragmatic hernia without obstruction or gangrene: Secondary | ICD-10-CM | POA: Insufficient documentation

## 2011-03-11 DIAGNOSIS — J984 Other disorders of lung: Secondary | ICD-10-CM | POA: Insufficient documentation

## 2011-03-11 DIAGNOSIS — J9819 Other pulmonary collapse: Secondary | ICD-10-CM | POA: Insufficient documentation

## 2011-03-11 DIAGNOSIS — J90 Pleural effusion, not elsewhere classified: Secondary | ICD-10-CM | POA: Insufficient documentation

## 2011-03-11 DIAGNOSIS — Z79899 Other long term (current) drug therapy: Secondary | ICD-10-CM | POA: Insufficient documentation

## 2011-03-11 DIAGNOSIS — M949 Disorder of cartilage, unspecified: Secondary | ICD-10-CM | POA: Insufficient documentation

## 2011-03-11 DIAGNOSIS — M899 Disorder of bone, unspecified: Secondary | ICD-10-CM | POA: Insufficient documentation

## 2011-03-11 DIAGNOSIS — R188 Other ascites: Secondary | ICD-10-CM | POA: Insufficient documentation

## 2011-03-11 MED ORDER — IOHEXOL 300 MG/ML  SOLN
100.0000 mL | Freq: Once | INTRAMUSCULAR | Status: AC | PRN
  Administered 2011-03-11: 100 mL via INTRAVENOUS

## 2011-03-15 ENCOUNTER — Other Ambulatory Visit: Payer: Self-pay | Admitting: Internal Medicine

## 2011-03-15 ENCOUNTER — Encounter (HOSPITAL_BASED_OUTPATIENT_CLINIC_OR_DEPARTMENT_OTHER): Payer: 59 | Admitting: Internal Medicine

## 2011-03-15 DIAGNOSIS — K5900 Constipation, unspecified: Secondary | ICD-10-CM

## 2011-03-15 DIAGNOSIS — C3430 Malignant neoplasm of lower lobe, unspecified bronchus or lung: Secondary | ICD-10-CM

## 2011-03-15 DIAGNOSIS — C7952 Secondary malignant neoplasm of bone marrow: Secondary | ICD-10-CM

## 2011-03-15 DIAGNOSIS — Z5111 Encounter for antineoplastic chemotherapy: Secondary | ICD-10-CM

## 2011-03-15 LAB — COMPREHENSIVE METABOLIC PANEL
CO2: 23 mEq/L (ref 19–32)
Creatinine, Ser: 0.93 mg/dL (ref 0.50–1.10)
Glucose, Bld: 86 mg/dL (ref 70–99)
Total Bilirubin: 0.5 mg/dL (ref 0.3–1.2)

## 2011-03-15 LAB — CBC WITH DIFFERENTIAL/PLATELET
Basophils Absolute: 0 10*3/uL (ref 0.0–0.1)
Eosinophils Absolute: 0.1 10*3/uL (ref 0.0–0.5)
HCT: 37.5 % (ref 34.8–46.6)
LYMPH%: 21 % (ref 14.0–49.7)
MONO#: 0.5 10*3/uL (ref 0.1–0.9)
NEUT#: 3.6 10*3/uL (ref 1.5–6.5)
NEUT%: 67.7 % (ref 38.4–76.8)
Platelets: 240 10*3/uL (ref 145–400)
WBC: 5.4 10*3/uL (ref 3.9–10.3)

## 2011-04-14 ENCOUNTER — Other Ambulatory Visit: Payer: Self-pay | Admitting: Internal Medicine

## 2011-04-14 ENCOUNTER — Encounter (HOSPITAL_BASED_OUTPATIENT_CLINIC_OR_DEPARTMENT_OTHER): Payer: Managed Care, Other (non HMO) | Admitting: Internal Medicine

## 2011-04-14 DIAGNOSIS — K5900 Constipation, unspecified: Secondary | ICD-10-CM

## 2011-04-14 DIAGNOSIS — C3430 Malignant neoplasm of lower lobe, unspecified bronchus or lung: Secondary | ICD-10-CM

## 2011-04-14 LAB — COMPREHENSIVE METABOLIC PANEL
Albumin: 4.2 g/dL (ref 3.5–5.2)
CO2: 24 mEq/L (ref 19–32)
Calcium: 9.6 mg/dL (ref 8.4–10.5)
Chloride: 104 mEq/L (ref 96–112)
Glucose, Bld: 90 mg/dL (ref 70–99)
Sodium: 139 mEq/L (ref 135–145)
Total Bilirubin: 0.4 mg/dL (ref 0.3–1.2)
Total Protein: 7.3 g/dL (ref 6.0–8.3)

## 2011-04-14 LAB — CBC WITH DIFFERENTIAL/PLATELET
Eosinophils Absolute: 0.1 10*3/uL (ref 0.0–0.5)
HCT: 35 % (ref 34.8–46.6)
LYMPH%: 23 % (ref 14.0–49.7)
MONO#: 0.4 10*3/uL (ref 0.1–0.9)
NEUT#: 2.9 10*3/uL (ref 1.5–6.5)
Platelets: 226 10*3/uL (ref 145–400)
RBC: 3.96 10*6/uL (ref 3.70–5.45)
WBC: 4.4 10*3/uL (ref 3.9–10.3)

## 2011-05-12 ENCOUNTER — Other Ambulatory Visit: Payer: Self-pay | Admitting: Internal Medicine

## 2011-05-12 ENCOUNTER — Ambulatory Visit (HOSPITAL_COMMUNITY)
Admission: RE | Admit: 2011-05-12 | Discharge: 2011-05-12 | Disposition: A | Discharge status: Home / Self Care | Payer: Managed Care, Other (non HMO) | Source: Ambulatory Visit | Attending: Physician Assistant | Admitting: Physician Assistant

## 2011-05-12 ENCOUNTER — Encounter (HOSPITAL_BASED_OUTPATIENT_CLINIC_OR_DEPARTMENT_OTHER): Payer: Managed Care, Other (non HMO) | Admitting: Internal Medicine

## 2011-05-12 ENCOUNTER — Other Ambulatory Visit (HOSPITAL_COMMUNITY): Payer: Self-pay | Admitting: Physician Assistant

## 2011-05-12 DIAGNOSIS — M25551 Pain in right hip: Secondary | ICD-10-CM

## 2011-05-12 DIAGNOSIS — C3490 Malignant neoplasm of unspecified part of unspecified bronchus or lung: Secondary | ICD-10-CM

## 2011-05-12 DIAGNOSIS — C3430 Malignant neoplasm of lower lobe, unspecified bronchus or lung: Secondary | ICD-10-CM

## 2011-05-12 DIAGNOSIS — C7952 Secondary malignant neoplasm of bone marrow: Secondary | ICD-10-CM | POA: Insufficient documentation

## 2011-05-12 DIAGNOSIS — C7951 Secondary malignant neoplasm of bone: Secondary | ICD-10-CM | POA: Insufficient documentation

## 2011-05-12 DIAGNOSIS — M25559 Pain in unspecified hip: Secondary | ICD-10-CM | POA: Insufficient documentation

## 2011-05-12 DIAGNOSIS — M25552 Pain in left hip: Secondary | ICD-10-CM

## 2011-05-12 LAB — COMPREHENSIVE METABOLIC PANEL
ALT: 22 U/L (ref 0–35)
Albumin: 4.1 g/dL (ref 3.5–5.2)
CO2: 22 mEq/L (ref 19–32)
Chloride: 100 mEq/L (ref 96–112)
Glucose, Bld: 113 mg/dL — ABNORMAL HIGH (ref 70–99)
Potassium: 3.7 mEq/L (ref 3.5–5.3)
Sodium: 135 mEq/L (ref 135–145)
Total Protein: 6.9 g/dL (ref 6.0–8.3)

## 2011-05-12 LAB — CBC WITH DIFFERENTIAL/PLATELET
BASO%: 0.3 % (ref 0.0–2.0)
Eosinophils Absolute: 0.1 10*3/uL (ref 0.0–0.5)
MONO#: 0.4 10*3/uL (ref 0.1–0.9)
NEUT#: 2.7 10*3/uL (ref 1.5–6.5)
Platelets: 233 10*3/uL (ref 145–400)
RBC: 4.15 10*6/uL (ref 3.70–5.45)
RDW: 12.8 % (ref 11.2–14.5)
WBC: 4.2 10*3/uL (ref 3.9–10.3)
lymph#: 1 10*3/uL (ref 0.9–3.3)

## 2011-05-18 ENCOUNTER — Other Ambulatory Visit: Payer: Self-pay | Admitting: Internal Medicine

## 2011-05-18 DIAGNOSIS — C3490 Malignant neoplasm of unspecified part of unspecified bronchus or lung: Secondary | ICD-10-CM

## 2011-05-25 ENCOUNTER — Ambulatory Visit (HOSPITAL_COMMUNITY): Payer: Managed Care, Other (non HMO)

## 2011-05-25 ENCOUNTER — Encounter (HOSPITAL_COMMUNITY)
Admission: RE | Admit: 2011-05-25 | Discharge: 2011-05-25 | Disposition: A | Discharge status: Home / Self Care | Payer: Managed Care, Other (non HMO) | Source: Ambulatory Visit | Attending: Internal Medicine | Admitting: Internal Medicine

## 2011-05-25 DIAGNOSIS — C3490 Malignant neoplasm of unspecified part of unspecified bronchus or lung: Secondary | ICD-10-CM | POA: Insufficient documentation

## 2011-05-25 DIAGNOSIS — C7951 Secondary malignant neoplasm of bone: Secondary | ICD-10-CM | POA: Insufficient documentation

## 2011-05-25 MED ORDER — TECHNETIUM TC 99M MEDRONATE IV KIT
27.0000 | PACK | Freq: Once | INTRAVENOUS | Status: AC | PRN
  Administered 2011-05-25: 27 via INTRAVENOUS

## 2011-06-03 ENCOUNTER — Ambulatory Visit (HOSPITAL_COMMUNITY)
Admission: RE | Admit: 2011-06-03 | Discharge: 2011-06-03 | Disposition: A | Discharge status: Home / Self Care | Payer: Managed Care, Other (non HMO) | Source: Ambulatory Visit | Attending: Internal Medicine | Admitting: Internal Medicine

## 2011-06-03 ENCOUNTER — Other Ambulatory Visit: Payer: Self-pay | Admitting: Internal Medicine

## 2011-06-03 ENCOUNTER — Encounter (HOSPITAL_BASED_OUTPATIENT_CLINIC_OR_DEPARTMENT_OTHER): Payer: Managed Care, Other (non HMO) | Admitting: Internal Medicine

## 2011-06-03 DIAGNOSIS — C7951 Secondary malignant neoplasm of bone: Secondary | ICD-10-CM | POA: Insufficient documentation

## 2011-06-03 DIAGNOSIS — C3430 Malignant neoplasm of lower lobe, unspecified bronchus or lung: Secondary | ICD-10-CM

## 2011-06-03 DIAGNOSIS — C3490 Malignant neoplasm of unspecified part of unspecified bronchus or lung: Secondary | ICD-10-CM

## 2011-06-03 DIAGNOSIS — C7952 Secondary malignant neoplasm of bone marrow: Secondary | ICD-10-CM

## 2011-06-03 LAB — CBC WITH DIFFERENTIAL/PLATELET
Eosinophils Absolute: 0.1 10*3/uL (ref 0.0–0.5)
HCT: 35.3 % (ref 34.8–46.6)
LYMPH%: 27 % (ref 14.0–49.7)
MONO#: 0.4 10*3/uL (ref 0.1–0.9)
NEUT#: 2.7 10*3/uL (ref 1.5–6.5)
Platelets: 228 10*3/uL (ref 145–400)
RBC: 4 10*6/uL (ref 3.70–5.45)
WBC: 4.4 10*3/uL (ref 3.9–10.3)

## 2011-06-03 LAB — COMPREHENSIVE METABOLIC PANEL
Albumin: 4.5 g/dL (ref 3.5–5.2)
CO2: 23 mEq/L (ref 19–32)
Calcium: 9.4 mg/dL (ref 8.4–10.5)
Glucose, Bld: 79 mg/dL (ref 70–99)
Sodium: 138 mEq/L (ref 135–145)
Total Bilirubin: 0.5 mg/dL (ref 0.3–1.2)
Total Protein: 7 g/dL (ref 6.0–8.3)

## 2011-06-03 MED ORDER — IOHEXOL 300 MG/ML  SOLN: 100.0000 mL | Freq: Once | INTRAMUSCULAR | Status: AC | PRN

## 2011-06-09 ENCOUNTER — Encounter (HOSPITAL_BASED_OUTPATIENT_CLINIC_OR_DEPARTMENT_OTHER): Payer: Managed Care, Other (non HMO) | Admitting: Internal Medicine

## 2011-06-09 DIAGNOSIS — C7951 Secondary malignant neoplasm of bone: Secondary | ICD-10-CM

## 2011-06-09 DIAGNOSIS — C3430 Malignant neoplasm of lower lobe, unspecified bronchus or lung: Secondary | ICD-10-CM

## 2011-06-29 ENCOUNTER — Ambulatory Visit (HOSPITAL_COMMUNITY)
Admission: RE | Admit: 2011-06-29 | Discharge: 2011-06-29 | Disposition: A | Discharge status: Home / Self Care | Payer: Managed Care, Other (non HMO) | Source: Ambulatory Visit | Attending: Radiation Oncology | Admitting: Radiation Oncology

## 2011-06-29 ENCOUNTER — Ambulatory Visit
Admission: RE | Admit: 2011-06-29 | Discharge: 2011-06-29 | Disposition: A | Discharge status: Home / Self Care | Payer: Managed Care, Other (non HMO) | Source: Ambulatory Visit | Attending: Radiation Oncology | Admitting: Radiation Oncology

## 2011-06-29 ENCOUNTER — Other Ambulatory Visit: Payer: Self-pay | Admitting: Radiation Oncology

## 2011-06-29 DIAGNOSIS — Z800 Family history of malignant neoplasm of digestive organs: Secondary | ICD-10-CM | POA: Insufficient documentation

## 2011-06-29 DIAGNOSIS — R52 Pain, unspecified: Secondary | ICD-10-CM

## 2011-06-29 DIAGNOSIS — Z79899 Other long term (current) drug therapy: Secondary | ICD-10-CM | POA: Insufficient documentation

## 2011-06-29 DIAGNOSIS — C3490 Malignant neoplasm of unspecified part of unspecified bronchus or lung: Secondary | ICD-10-CM | POA: Insufficient documentation

## 2011-06-29 DIAGNOSIS — K219 Gastro-esophageal reflux disease without esophagitis: Secondary | ICD-10-CM | POA: Insufficient documentation

## 2011-06-29 DIAGNOSIS — M899 Disorder of bone, unspecified: Secondary | ICD-10-CM | POA: Insufficient documentation

## 2011-06-29 DIAGNOSIS — C7951 Secondary malignant neoplasm of bone: Secondary | ICD-10-CM | POA: Insufficient documentation

## 2011-06-29 DIAGNOSIS — Z87891 Personal history of nicotine dependence: Secondary | ICD-10-CM | POA: Insufficient documentation

## 2011-06-29 DIAGNOSIS — Z803 Family history of malignant neoplasm of breast: Secondary | ICD-10-CM | POA: Insufficient documentation

## 2011-06-29 DIAGNOSIS — C7952 Secondary malignant neoplasm of bone marrow: Secondary | ICD-10-CM | POA: Insufficient documentation

## 2011-06-29 DIAGNOSIS — Z808 Family history of malignant neoplasm of other organs or systems: Secondary | ICD-10-CM | POA: Insufficient documentation

## 2011-06-29 DIAGNOSIS — Z8041 Family history of malignant neoplasm of ovary: Secondary | ICD-10-CM | POA: Insufficient documentation

## 2011-07-07 ENCOUNTER — Other Ambulatory Visit: Payer: Self-pay | Admitting: Internal Medicine

## 2011-07-07 ENCOUNTER — Encounter (HOSPITAL_BASED_OUTPATIENT_CLINIC_OR_DEPARTMENT_OTHER): Payer: Managed Care, Other (non HMO) | Admitting: Internal Medicine

## 2011-07-07 DIAGNOSIS — C7951 Secondary malignant neoplasm of bone: Secondary | ICD-10-CM

## 2011-07-07 DIAGNOSIS — C3430 Malignant neoplasm of lower lobe, unspecified bronchus or lung: Secondary | ICD-10-CM

## 2011-07-07 LAB — COMPREHENSIVE METABOLIC PANEL
ALT: 21 U/L (ref 0–35)
Albumin: 4.1 g/dL (ref 3.5–5.2)
CO2: 23 mEq/L (ref 19–32)
Glucose, Bld: 63 mg/dL — ABNORMAL LOW (ref 70–99)
Potassium: 4.2 mEq/L (ref 3.5–5.3)
Sodium: 137 mEq/L (ref 135–145)
Total Protein: 7 g/dL (ref 6.0–8.3)

## 2011-07-07 LAB — CBC WITH DIFFERENTIAL/PLATELET
Eosinophils Absolute: 0.1 10*3/uL (ref 0.0–0.5)
MONO#: 0.5 10*3/uL (ref 0.1–0.9)
NEUT#: 3 10*3/uL (ref 1.5–6.5)
Platelets: 238 10*3/uL (ref 145–400)
RBC: 4.14 10*6/uL (ref 3.70–5.45)
RDW: 12.3 % (ref 11.2–14.5)
WBC: 4.8 10*3/uL (ref 3.9–10.3)
lymph#: 1.1 10*3/uL (ref 0.9–3.3)

## 2011-07-13 ENCOUNTER — Encounter (HOSPITAL_COMMUNITY)
Admission: RE | Admit: 2011-07-13 | Discharge: 2011-07-13 | Disposition: A | Discharge status: Home / Self Care | Payer: Managed Care, Other (non HMO) | Source: Ambulatory Visit | Attending: Orthopedic Surgery | Admitting: Orthopedic Surgery

## 2011-07-13 ENCOUNTER — Other Ambulatory Visit (HOSPITAL_COMMUNITY): Payer: Self-pay | Admitting: Orthopedic Surgery

## 2011-07-13 DIAGNOSIS — C7951 Secondary malignant neoplasm of bone: Secondary | ICD-10-CM

## 2011-07-13 LAB — PROTIME-INR: Prothrombin Time: 12.9 seconds (ref 11.6–15.2)

## 2011-07-13 LAB — CBC
MCH: 29.5 pg (ref 26.0–34.0)
Platelets: 250 10*3/uL (ref 150–400)
RBC: 4.44 MIL/uL (ref 3.87–5.11)
RDW: 12.6 % (ref 11.5–15.5)
WBC: 6.3 10*3/uL (ref 4.0–10.5)

## 2011-07-13 LAB — COMPREHENSIVE METABOLIC PANEL
Albumin: 3.7 g/dL (ref 3.5–5.2)
Alkaline Phosphatase: 238 U/L — ABNORMAL HIGH (ref 39–117)
BUN: 12 mg/dL (ref 6–23)
Calcium: 9.1 mg/dL (ref 8.4–10.5)
GFR calc Af Amer: 83 mL/min — ABNORMAL LOW (ref >90)
Potassium: 4.3 mEq/L (ref 3.5–5.1)
Sodium: 135 mEq/L (ref 135–145)
Total Protein: 7.4 g/dL (ref 6.0–8.3)

## 2011-07-13 LAB — SURGICAL PCR SCREEN: MRSA, PCR: NEGATIVE

## 2011-07-13 LAB — TYPE AND SCREEN
ABO/RH(D): A NEG
Antibody Screen: NEGATIVE

## 2011-07-18 ENCOUNTER — Inpatient Hospital Stay (HOSPITAL_COMMUNITY): Payer: Managed Care, Other (non HMO)

## 2011-07-18 ENCOUNTER — Inpatient Hospital Stay (HOSPITAL_COMMUNITY)
Admission: RE | Admit: 2011-07-18 | Discharge: 2011-07-22 | DRG: 482 | Disposition: A | Discharge status: Home Health | Payer: Managed Care, Other (non HMO) | Source: Ambulatory Visit | Attending: Orthopedic Surgery | Admitting: Orthopedic Surgery

## 2011-07-18 ENCOUNTER — Other Ambulatory Visit: Payer: Self-pay | Admitting: Orthopedic Surgery

## 2011-07-18 DIAGNOSIS — Z01812 Encounter for preprocedural laboratory examination: Secondary | ICD-10-CM

## 2011-07-18 DIAGNOSIS — Z85118 Personal history of other malignant neoplasm of bronchus and lung: Secondary | ICD-10-CM

## 2011-07-18 DIAGNOSIS — C7951 Secondary malignant neoplasm of bone: Principal | ICD-10-CM | POA: Diagnosis present

## 2011-07-19 LAB — BASIC METABOLIC PANEL
BUN: 9 mg/dL (ref 6–23)
CO2: 22 mEq/L (ref 19–32)
Chloride: 105 mEq/L (ref 96–112)
Creatinine, Ser: 0.67 mg/dL (ref 0.50–1.10)
GFR calc Af Amer: >90 mL/min (ref >90)

## 2011-07-19 LAB — CBC
MCH: 29.6 pg (ref 26.0–34.0)
MCHC: 33.5 g/dL (ref 30.0–36.0)
MCV: 88.5 fL (ref 78.0–100.0)
Platelets: 163 10*3/uL (ref 150–400)
RBC: 3.04 MIL/uL — ABNORMAL LOW (ref 3.87–5.11)
RDW: 12.9 % (ref 11.5–15.5)

## 2011-07-19 LAB — PROTIME-INR: Prothrombin Time: 15.1 seconds (ref 11.6–15.2)

## 2011-07-19 NOTE — Op Note (Signed)
NAMESAMARIAH, Makayla [name] NO.:  [id number]  MEDICAL RECORD NO.:  [id number]  LOCATION:  5006                         FACILITY:  [facility]  PHYSICIAN:  Eulas Post, MD    DATE OF BIRTH:  1951/09/10  DATE OF PROCEDURE:  07/18/2011 DATE OF DISCHARGE:                              OPERATIVE REPORT   ATTENDING SURGEON:  Eulas Post, MD  FIRST ASSISTANT:  Kingsley Plan, orthopedic PA-C  PREOPERATIVE DIAGNOSIS:  Right intertrochanteric hip metastatic lung cancer lesion.  POSTOPERATIVE DIAGNOSIS:  Right intertrochanteric hip metastatic lung cancer lesion.  OPERATIVE PROCEDURE:  Right hip prophylactic trochanteric femoral nailing.  ANESTHESIA:  General.  ESTIMATED BLOOD LOSS:  150 mL.  OPERATIVE IMPLANT:  Synthes size 11 x 380 mm trochanteric femoral nail with a size 80-mm helical blade for the femoral head.  PREOPERATIVE INDICATIONS:  Ms. Makayla [name] is a 60 year old woman with metastatic lung cancer who had recalcitrant right hip pain.  She had a metastatic lesion that was involving the intertrochanteric region, contributing to the pain.  She elected for a surgical management.  The risks, benefits and alternatives were discussed before the procedure including, but not limited to the risks of infection, bleeding, nerve injury, periprosthetic fracture, incomplete relief of pain, progression of metastatic disease, cardiopulmonary complications, among others and she is willing to proceed.  OPERATIVE SPECIMENS:  I did send reaming's from the lesion both during the entry with the superior reamer, as well as during the reaming from the cephalomedullary drill for the helical blade.  PROCEDURE:  The patient was brought to the operating suite in the supine position.  General anesthesia was administered.  IV antibiotics were given.  Time-out was performed.  She was placed on the fracture table. The right lower extremity was prepped and draped in usual  sterile fashion.  Incision was made proximal to greater trochanter.  A guidewire was introduced into the center of center position in the greater troch. I then opened the proximal femur with the reamer.  She had a fairly short greater trochanter, and I tried to medialize the reamer as much as possible in order to optimize the placement of the nail.  After opening this up, I placed a guidewire and I then sequentially reamed.  Her bone quality was extremely strong.  I reamed up to a 12.5 in order accommodate 11 nail.  This was very tight, and I had complete fill of the canal with the reamer.  This based on her anatomy just seem to be preferentially reaming some of the bone medially more so than laterally. Nonetheless, I did fully prepare the bone and then introduce the nail. In placement of the nail, it was extremely difficult to get it down, and I got the nail about halfway down the canal, and when the proximal flare of the nail encountered the proximal geometry of the femur, despite the fact that I already reamed with a 17, this was blocking the placement of the nail more distally.  Therefore, I went back to reaming at this point, and went with the solid reamer, the 17-mm and then free hand reamed more medially in order to create more space  for the nail proximally.  In doing so, by torquing on the reamer, in fact, I broke 1 of the reamers.  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  I then went back with the nail again, and then worked for a period of at least a 0.5 hour using gentle taps, as well as fine manipulation, in order to gently get the nail down.  During the course of the operation, I had in fact called to see whether or not there were any smaller nails, possibly the proximal lateral entry nail that may have a smaller proximal geometry in order to fit her anatomy, however, none were capable of being delivered at that time.  I did  gently continue to work to get the nail down and finally was able to get the nail fully seated. I then placed the jig for the helical blade, and inserted a guidewire. This was into the center, center position in the head.  I then reamed up into the head using the reamer.  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  After reaming and preparing the head fully, I placed a size 80-mm helical blade.  This had extremely strong bite, and I could not advance the blade all the way up with a small mallet, and in fact needed to use a large mallet.  There was full fill of the canal, and I did not crack any of the cortices at any point.  I confirmed overall position of all of the hardware, and took AP and lateral views of everything, and secured the nail proximally with the interlocking bolt.  The jigs were then removed and final C-arm pictures were taken and the wounds were irrigated copiously and the deep tissue closed with Vicryl followed by Vicryl and Monocryl for the subcutaneous tissue and skin.  Sterile gauze was applied.  She was then awakened and returned to the PACU in stable satisfactory condition.  She tolerated the procedure well.  There were no complications.  Janace Litten, orthopedic PA-C was present and scrubbed throughout the case and critical for assistance with instrumentation as well as raring, and closure.     Eulas Post, MD     JPL/MEDQ  D:  07/18/2011  T:  07/19/2011  Job:  [id number]  Electronically Signed by Teryl Lucy MD on 07/19/2011 09:47:23 AM

## 2011-07-21 LAB — PROTIME-INR: INR: 1.81 — ABNORMAL HIGH (ref 0.00–1.49)

## 2011-07-22 LAB — PROTIME-INR: Prothrombin Time: 22 seconds — ABNORMAL HIGH (ref 11.6–15.2)

## 2011-07-25 NOTE — Discharge Summary (Signed)
  NAMESKYELAR, HALLIDAY                 ACCOUNT NO.:  [id number]  MEDICAL RECORD NO.:  [id number]  LOCATION:  5006                         FACILITY:  [facility]  PHYSICIAN:  Eulas Post, MD    DATE OF BIRTH:  August 16, 1951  DATE OF ADMISSION:  07/18/2011 DATE OF DISCHARGE:  07/22/2011                              DISCHARGE SUMMARY   ADMISSION DIAGNOSIS:  Right intertrochanteric metastatic lesion.  DISCHARGE DIAGNOSIS:  Right intertrochanteric metastatic lesion.  PRIMARY PROCEDURE:  Right hip prophylactic trochanteric femoral nailing.  HOSPITAL COURSE:  Ms. Makayla [name] is a 60 year old woman with metastatic lung cancer, who elected for prophylactic nailing of a painful right hip.  The risks, benefits, and alternatives were discussed and she tolerated the procedure well.  Postoperatively, she did not have any complications.  She was given perioperative Lovenox and Coumadin bridging to a therapeutic Coumadin for DVT prophylaxis.  She was given sequential compression devices as well.  She was given perioperative antibiotics for antimicrobial prophylaxis.  She was on a PCA and then switched to p.o. analgesics.  She was working with therapy and making steady progress.  She is planned to be discharged home with followup with me in approximately 2 weeks.  There were no complications and she benefited maximally from her hospital stay.  Her wounds were clean, dry, and intact and dressings changed prior to discharge.     Eulas Post, MD     JPL/MEDQ  D:  07/22/2011  T:  07/22/2011  Job:  [id number]  Electronically Signed by Teryl Lucy MD on 07/25/2011 04:22:25 PM

## 2011-08-04 ENCOUNTER — Other Ambulatory Visit: Payer: Self-pay | Admitting: Internal Medicine

## 2011-08-04 ENCOUNTER — Encounter (HOSPITAL_BASED_OUTPATIENT_CLINIC_OR_DEPARTMENT_OTHER): Payer: Managed Care, Other (non HMO) | Admitting: Internal Medicine

## 2011-08-04 ENCOUNTER — Telehealth: Payer: Self-pay | Admitting: Internal Medicine

## 2011-08-04 DIAGNOSIS — C7951 Secondary malignant neoplasm of bone: Secondary | ICD-10-CM

## 2011-08-04 DIAGNOSIS — C3430 Malignant neoplasm of lower lobe, unspecified bronchus or lung: Secondary | ICD-10-CM

## 2011-08-04 LAB — CBC WITH DIFFERENTIAL/PLATELET
Eosinophils Absolute: 0 10*3/uL (ref 0.0–0.5)
MONO#: 0.3 10*3/uL (ref 0.1–0.9)
NEUT#: 3.3 10*3/uL (ref 1.5–6.5)
RBC: 3.63 10*6/uL — ABNORMAL LOW (ref 3.70–5.45)
RDW: 14 % (ref 11.2–14.5)
WBC: 4.3 10*3/uL (ref 3.9–10.3)

## 2011-08-04 LAB — COMPREHENSIVE METABOLIC PANEL
Albumin: 3.8 g/dL (ref 3.5–5.2)
CO2: 21 mEq/L (ref 19–32)
Glucose, Bld: 108 mg/dL — ABNORMAL HIGH (ref 70–99)
Potassium: 4.3 mEq/L (ref 3.5–5.3)
Sodium: 136 mEq/L (ref 135–145)
Total Protein: 6.5 g/dL (ref 6.0–8.3)

## 2011-08-08 NOTE — Progress Notes (Signed)
CC:   Makayla [name], M.D. Makayla Pounds, MD R. Roetta Sessions, MD Makayla [name]  PRINCIPAL DIAGNOSIS:  Metastatic nonsmall cell lung cancer, adenocarcinoma, diagnosed in December 2010 in a patient with a remote history of smoking and positive EGFR mutation.  PRIOR THERAPY: 1. Status post palliative radiotherapy to the right hilum as well as     anterior inferior ribs and lumbar spine from L2 to L5 under the     care of Dr. Shon Baton. 2. Status post right hip prophylactic trochanteric femoral nailing     under the care of Dr. Eulas Post.  CURRENT THERAPY: 1. Tarceva at 150 mg p.o. daily, status post 23 months of treatment. 2. Xgeva 120 mg subcutaneously given on a monthly basis for bone     metastasis, status post 1 treatment.  HISTORY:  Makayla [name] presents accompanied by her son and her sister for a scheduled followup visit.  Overall, she is progressing nicely after her prophylactic right hip trochanteric femoral nailing.  She saw Dr. Dion Saucier yesterday and was told that things are looking good.  She has 2 more weeks of physical therapy at home.  She is being treated with therapeutic range Coumadin under the care of Dr. Shelba Flake office for postsurgical DVT prophylaxis.  She still has some soreness in the hip in the incision area but overall is pleased with her progress.  The bone biopsy from this procedure was negative for malignancy.  She has an appointment tentatively set up September 15, 2011, with Dr. Michell Heinrich if she is still having significant pain for possible palliative radiotherapy.  She reports some shortness of breath with exertion but overall voiced no other complaints.  REVIEW OF SYSTEMS:  Significant for pain and soreness in the right hip in the right hip incision area, as well as some dyspnea with exertion.  PERFORMANCE STATUS:  ECOG 1.  PHYSICAL EXAMINATION:  Vital Signs:  Reveal a blood pressure of 111/68 with a pulse of 92, respirations 20, temperature 97.1, weight  169.2 [name].  General Exam:  Reveals a very pleasant 60 year old white female who is awake, alert, in no acute distress.  HEENT:  Normocephalic, atraumatic.  Sclerae anicteric.  Oropharynx clear.  No evidence of thrush or mucositis.  Neck:  Supple without palpable lymphadenopathy. Respiratory Exam:  Clear to auscultation without wheezes, rales or rhonchi.  Cardiovascular Exam:  Reveals a regular rate and rhythm with a normal S1 and S2.  No appreciable murmurs, gallops or rubs. Gastrointestinal Exam:  Reveals a soft, nontender, nondistended abdomen without appreciable masses or organomegaly.  Lower Extremities:  Without point tenderness, pitting edema or palpable cords.  LABORATORY DATA:  Reveals a white count of 4.3 with an ANC of 3.3, hemoglobin 11.1, hematocrit 32.8, platelets 355.  CMET is pending.  ASSESSMENT AND PLAN:  This is a very pleasant 60 year old white female with metastatic nonsmall cell lung cancer, status post 23 months of treatment with Tarceva at 150 mg p.o. daily.  She is also status post prophylactic right hip trochanteric femoral nailing.  She is to follow up with Orthopedics as scheduled.  The patient was discussed with [name].  She will return in 4 weeks for repeat CBC, diff and CMET.  She will be due for restaging scans to reevaluate her disease in December 2012.  She will proceed with her scheduled dose of Xgeva today.  She does have a little right lower extremity soft tissue swelling which is to be expected status post her recent surgical procedure.  She will return in 4 weeks with repeat CBC, diff and CMET and her next Xgeva injection.    ______________________________ Makayla Loft, PA-C AJ/MEDQ  D:  08/05/2011  T:  08/08/2011  Job:  [id number]

## 2011-08-11 ENCOUNTER — Telehealth: Payer: Self-pay | Admitting: Internal Medicine

## 2011-08-11 NOTE — Telephone Encounter (Signed)
Patient's friend called or nurse and reported pt has cloudy urine. I called patient and she reports burning with urination, frequency q 2 hours and blood tinged urine. I instructed patient to call her primary care. She said she would.

## 2011-08-16 ENCOUNTER — Telehealth: Payer: Self-pay | Admitting: *Deleted

## 2011-08-16 NOTE — Telephone Encounter (Signed)
RECEIVED A FAX FROM [employer] CONCERNING A PRESCRIPTION REFILL REQUEST FOR TARCEVA. THIS REQUEST WAS GIVEN TO DR.MOHAMED'S NURSE, STEPHANIE JOHNSON,RN.

## 2011-08-18 ENCOUNTER — Other Ambulatory Visit: Payer: Self-pay | Admitting: *Deleted

## 2011-08-18 DIAGNOSIS — C3490 Malignant neoplasm of unspecified part of unspecified bronchus or lung: Secondary | ICD-10-CM

## 2011-08-19 ENCOUNTER — Encounter: Payer: Self-pay | Admitting: *Deleted

## 2011-08-19 MED ORDER — ERLOTINIB HCL 150 MG PO TABS: 150.0000 mg | ORAL_TABLET | Freq: Every day | ORAL | Status: DC

## 2011-08-26 ENCOUNTER — Other Ambulatory Visit: Payer: Self-pay | Admitting: *Deleted

## 2011-08-26 DIAGNOSIS — C3490 Malignant neoplasm of unspecified part of unspecified bronchus or lung: Secondary | ICD-10-CM

## 2011-08-26 MED ORDER — MORPHINE SULFATE CR 15 MG PO TB12: 15.0000 mg | ORAL_TABLET | Freq: Two times a day (BID) | ORAL | Status: DC

## 2011-08-30 ENCOUNTER — Other Ambulatory Visit: Payer: Self-pay | Admitting: Physician Assistant

## 2011-09-01 ENCOUNTER — Other Ambulatory Visit: Payer: Self-pay | Admitting: Internal Medicine

## 2011-09-01 ENCOUNTER — Encounter: Payer: Self-pay | Admitting: Physician Assistant

## 2011-09-01 ENCOUNTER — Other Ambulatory Visit (HOSPITAL_BASED_OUTPATIENT_CLINIC_OR_DEPARTMENT_OTHER): Payer: Managed Care, Other (non HMO) | Admitting: Lab

## 2011-09-01 ENCOUNTER — Ambulatory Visit (HOSPITAL_BASED_OUTPATIENT_CLINIC_OR_DEPARTMENT_OTHER): Payer: Managed Care, Other (non HMO) | Admitting: Physician Assistant

## 2011-09-01 ENCOUNTER — Telehealth: Payer: Self-pay | Admitting: Internal Medicine

## 2011-09-01 ENCOUNTER — Ambulatory Visit: Payer: Managed Care, Other (non HMO)

## 2011-09-01 VITALS — BP 129/82 | HR 87 | Temp 98.4°F | Ht 64.0 in | Wt 166.2 lb

## 2011-09-01 DIAGNOSIS — C3430 Malignant neoplasm of lower lobe, unspecified bronchus or lung: Secondary | ICD-10-CM

## 2011-09-01 DIAGNOSIS — C7952 Secondary malignant neoplasm of bone marrow: Secondary | ICD-10-CM

## 2011-09-01 DIAGNOSIS — Z87891 Personal history of nicotine dependence: Secondary | ICD-10-CM

## 2011-09-01 DIAGNOSIS — C3490 Malignant neoplasm of unspecified part of unspecified bronchus or lung: Secondary | ICD-10-CM | POA: Insufficient documentation

## 2011-09-01 DIAGNOSIS — C7951 Secondary malignant neoplasm of bone: Secondary | ICD-10-CM

## 2011-09-01 HISTORY — DX: Malignant neoplasm of unspecified part of unspecified bronchus or lung: C34.90

## 2011-09-01 LAB — COMPREHENSIVE METABOLIC PANEL
ALT: 11 U/L (ref 0–35)
AST: 22 U/L (ref 0–37)
Albumin: 4 g/dL (ref 3.5–5.2)
Alkaline Phosphatase: 151 U/L — ABNORMAL HIGH (ref 39–117)
Glucose, Bld: 74 mg/dL (ref 70–99)
Potassium: 4 mEq/L (ref 3.5–5.3)
Sodium: 138 mEq/L (ref 135–145)
Total Protein: 6.6 g/dL (ref 6.0–8.3)

## 2011-09-01 LAB — CBC WITH DIFFERENTIAL/PLATELET
BASO%: 0.3 % (ref 0.0–2.0)
Eosinophils Absolute: 0.1 10*3/uL (ref 0.0–0.5)
MCV: 90.4 fL (ref 79.5–101.0)
MONO%: 12.3 % (ref 0.0–14.0)
NEUT#: 2.8 10*3/uL (ref 1.5–6.5)
RBC: 3.84 10*6/uL (ref 3.70–5.45)
RDW: 13 % (ref 11.2–14.5)
WBC: 4.5 10*3/uL (ref 3.9–10.3)

## 2011-09-01 MED ORDER — MORPHINE SULFATE CR 15 MG PO TB12: 15.0000 mg | ORAL_TABLET | Freq: Two times a day (BID) | ORAL | Status: DC

## 2011-09-01 NOTE — Progress Notes (Signed)
No images are attached to the encounter. No scans are attached to the encounter. No scans are attached to the encounter. [facility] OFFICE PROGRESS NOTE  [name], MD, MD [address] [facility], [place]. [place] [address]  DIAGNOSIS: Metastatic non-small cell lung cancer adenocarcinoma, diagnosed in December 2000 and and the patient with a remote history of smoking and positive EGFR mutation.  PRIOR THERAPY: 1. Status post palliative radiotherapy to the right hilum as well as anterior inferior ribs and lumbar spine from L2-L5 under the care Dr. Shon Baton. 2. status post right hip prophylactic trochanteric femoral nail and under the care of Dr. Eulas Post  CURRENT THERAPY: 1. Tarceva at 150 mg by mouth daily status post 24 months of treatment 2. X. Gina 120 mg subcutaneously given on a monthly basis for bone metastasis status post 2 treatments  INTERVAL HISTORY: Makayla [name] 60 y.o. female returns for a scheduled regular monthly visit for followup of her metastatic non-small cell lung cancer adenocarcinoma. She is accompanied by her sister and her son. She reports that she was reevaluated by Dr. Dion Saucier yesterday with x-rays. He was pleased with way things are healing. She continues to have some soreness in the right hip and anterior right thigh which she was told was the natural part of the healing process. She continues to take her MS Contin twice daily and is due for a refill for this medication. In addition she takes Percocet 10/325 every 6 hours as well as a muscle relaxant methocarbamol 500 mg every 6 hours. Regarding her side effects from the Tarceva she's had minimal rash lately and only mild episodes of diarrhea on occasion. She was taking magnesium on a regular basis and had more diarrhea but discontinued the magnesium. She did have a mild pressure sore developing on her buttocks but this was evaluated by a home health nurse and treated before it  broke down completely. It has since resolved.   MEDICAL HISTORY: Past Medical History  Diagnosis Date  . Lung cancer     lung ca dx 12/10  . COPD (chronic obstructive pulmonary disease)   . Lung cancer 09/01/2011    ALLERGIES:  is allergic to sulfonamide derivatives.  MEDICATIONS:  Current Outpatient Prescriptions  Medication Sig Dispense Refill  . ALPRAZolam (XANAX) 0.5 MG tablet Take 0.5 mg by mouth as needed.        . calcium-vitamin D (OSCAL WITH D) 500-200 MG-UNIT per tablet Take 1 tablet by mouth daily.        . Cholecalciferol (VITAMIN D-3 PO) Take 1,000 Units by mouth 2 (two) times daily.        . clindamycin (CLEOCIN T) 1 % lotion Apply topically 2 (two) times daily.        Marland Kitchen docusate sodium (COLACE) 100 MG capsule Take 100 mg by mouth as needed.        . erlotinib (TARCEVA) 150 MG tablet Take 1 tablet (150 mg total) by mouth daily. Take on an empty stomach 1 hour before meals or 2 hours after.  30 tablet  0  . erlotinib (TARCEVA) 150 MG tablet Take 1 tablet (150 mg total) by mouth daily. Take on an empty stomach 1 hour before meals or 2 hours after.  30 tablet  2  . ferrous sulfate 325 (65 FE) MG tablet Take 325 mg by mouth 2 (two) times daily.        Marland Kitchen HYDROcodone-acetaminophen (VICODIN) 5-500 MG per tablet Take  1 tablet by mouth every 6 (six) hours as needed.        . Iron-Vit C-Vit B12-Folic Acid (IRON 100 PLUS PO) Take 65 mg by mouth daily. OTC       . methocarbamol (ROBAXIN) 500 MG tablet Take 500 mg by mouth 4 (four) times daily.        Marland Kitchen morphine (MS CONTIN) 15 MG 12 hr tablet Take 1 tablet (15 mg total) by mouth 2 (two) times daily.  60 tablet  0  . oxyCODONE-acetaminophen (PERCOCET) 10-325 MG per tablet Take 1 tablet by mouth every 6 (six) hours as needed.        . prochlorperazine (COMPAZINE) 10 MG tablet Take 10 mg by mouth every 6 (six) hours as needed.        . warfarin (COUMADIN) 5 MG tablet Take 5 mg by mouth daily.          SURGICAL HISTORY:  Past Surgical  History  Procedure Date  . Abdominal hysterectomy     REVIEW OF SYSTEMS:  A comprehensive review of systems was negative except for: Musculoskeletal: positive for arthralgias and Specifically right hip and right thigh pain related to her recent orthopedic surgical procedure.   PHYSICAL EXAMINATION: General appearance: alert, cooperative and no distress Head: Normocephalic, without obvious abnormality, atraumatic Neck: no adenopathy, no carotid bruit, no JVD, supple, symmetrical, trachea midline and thyroid not enlarged, symmetric, no tenderness/mass/nodules Lymph nodes: Cervical, supraclavicular, and axillary nodes normal. Resp: clear to auscultation bilaterally Cardio: regular rate and rhythm, S1, S2 normal, no murmur, click, rub or gallop GI: soft, non-tender; bowel sounds normal; no masses,  no organomegaly Extremities: extremities normal, atraumatic, no cyanosis or edema  ECOG PERFORMANCE STATUS: 1 - Symptomatic but completely ambulatory  Blood pressure 129/82, pulse 87, temperature 98.4 F (36.9 C), temperature source Oral, height 5\' 4"  (1.626 m), weight 166 lb 3.2 oz (75.388 kg).  LABORATORY DATA: Lab Results  Component Value Date   WBC 4.5 09/01/2011   HGB 11.8 09/01/2011   HCT 34.7* 09/01/2011   MCV 90.4 09/01/2011   PLT 225 09/01/2011      Chemistry      Component Value Date/Time   NA 138 09/01/2011 0957   NA 138 09/01/2011 0957   K 4.0 09/01/2011 0957   K 4.0 09/01/2011 0957   CL 105 09/01/2011 0957   CL 105 09/01/2011 0957   CO2 24 09/01/2011 0957   CO2 24 09/01/2011 0957   BUN 11 09/01/2011 0957   BUN 11 09/01/2011 0957   CREATININE 0.84 09/01/2011 0957   CREATININE 0.84 09/01/2011 0957      Component Value Date/Time   CALCIUM 8.8 09/01/2011 0957   CALCIUM 8.8 09/01/2011 0957   ALKPHOS 151* 09/01/2011 0957   ALKPHOS 151* 09/01/2011 0957   AST 22 09/01/2011 0957   AST 22 09/01/2011 0957   ALT 11 09/01/2011 0957   ALT 11 09/01/2011 0957   BILITOT 0.4  09/01/2011 0957   BILITOT 0.4 09/01/2011 0957       RADIOGRAPHIC STUDIES:  No results found.  ASSESSMENT/PLAN:  This is a very pleasant 60 year old white female with metastatic non-small cell lung cancer, status post 24  months of treatment with Tarceva at 150 mg by mouth daily. Patient was discussed with [name]. She is given a prescription for MS Contin 15 mg tablets one by mouth every 12 hours for pain a total of 60 with no refill. She will followup with [name] in one month  with a repeat CBC differential CMETt and CT of the chest abdomen and pelvis with contrast to reevaluate her disease. She will continue with her monthly Xgeva injections for her bone metastasis.     Conni Slipper, PA-C     All questions were answered. The patient knows to call the clinic with any problems, questions or concerns. We can certainly see the patient much sooner if necessary.

## 2011-09-01 NOTE — Telephone Encounter (Signed)
Talked to pt , she will be here tomorrow to get a new calendar for the month of December

## 2011-09-02 ENCOUNTER — Ambulatory Visit (HOSPITAL_BASED_OUTPATIENT_CLINIC_OR_DEPARTMENT_OTHER): Payer: Managed Care, Other (non HMO)

## 2011-09-02 VITALS — BP 129/83 | HR 87 | Temp 97.4°F

## 2011-09-02 DIAGNOSIS — C7951 Secondary malignant neoplasm of bone: Secondary | ICD-10-CM

## 2011-09-02 DIAGNOSIS — C3490 Malignant neoplasm of unspecified part of unspecified bronchus or lung: Secondary | ICD-10-CM

## 2011-09-02 DIAGNOSIS — C3430 Malignant neoplasm of lower lobe, unspecified bronchus or lung: Secondary | ICD-10-CM

## 2011-09-02 MED ORDER — DENOSUMAB 120 MG/1.7ML ~~LOC~~ SOLN
120.0000 mg | Freq: Once | SUBCUTANEOUS | Status: AC
  Administered 2011-09-02: 120 mg via SUBCUTANEOUS
  Filled 2011-09-02: qty 1.7

## 2011-09-02 NOTE — Progress Notes (Signed)
CMet rescheduled from 12/24 to lab appt on 12/19. I am unable to change the order in the computer ,but I called lab and the pt and they are aware

## 2011-09-05 ENCOUNTER — Telehealth: Payer: Self-pay | Admitting: *Deleted

## 2011-09-05 NOTE — Telephone Encounter (Signed)
Pt called stating" I'm cancelling the Dec. 13, 12 appt at 9:15am, per Dr. Michell Heinrich said I could cancel if I felt I didn't need to come in<I saw my surgeon, and I don't feel it necessary to come back""please let her know", informed pt would inform  Md and Val,Malloy ,RN,thanked her for calling 9:51 AM

## 2011-09-15 ENCOUNTER — Ambulatory Visit: Payer: Managed Care, Other (non HMO) | Admitting: Radiation Oncology

## 2011-09-21 ENCOUNTER — Ambulatory Visit (HOSPITAL_COMMUNITY)
Admission: RE | Admit: 2011-09-21 | Discharge: 2011-09-21 | Disposition: A | Discharge status: Home / Self Care | Payer: Managed Care, Other (non HMO) | Source: Ambulatory Visit | Attending: Physician Assistant | Admitting: Physician Assistant

## 2011-09-21 ENCOUNTER — Ambulatory Visit (HOSPITAL_BASED_OUTPATIENT_CLINIC_OR_DEPARTMENT_OTHER): Payer: Managed Care, Other (non HMO) | Admitting: Lab

## 2011-09-21 DIAGNOSIS — C3490 Malignant neoplasm of unspecified part of unspecified bronchus or lung: Secondary | ICD-10-CM

## 2011-09-21 DIAGNOSIS — S32009A Unspecified fracture of unspecified lumbar vertebra, initial encounter for closed fracture: Secondary | ICD-10-CM | POA: Insufficient documentation

## 2011-09-21 DIAGNOSIS — C7951 Secondary malignant neoplasm of bone: Secondary | ICD-10-CM

## 2011-09-21 DIAGNOSIS — K449 Diaphragmatic hernia without obstruction or gangrene: Secondary | ICD-10-CM | POA: Insufficient documentation

## 2011-09-21 DIAGNOSIS — Z90710 Acquired absence of both cervix and uterus: Secondary | ICD-10-CM | POA: Insufficient documentation

## 2011-09-21 DIAGNOSIS — X58XXXA Exposure to other specified factors, initial encounter: Secondary | ICD-10-CM | POA: Insufficient documentation

## 2011-09-21 DIAGNOSIS — C7952 Secondary malignant neoplasm of bone marrow: Secondary | ICD-10-CM

## 2011-09-21 LAB — CBC WITH DIFFERENTIAL/PLATELET
BASO%: 0.3 % (ref 0.0–2.0)
EOS%: 1.8 % (ref 0.0–7.0)
MCH: 30.5 pg (ref 25.1–34.0)
MCHC: 34.5 g/dL (ref 31.5–36.0)
MCV: 88.2 fL (ref 79.5–101.0)
MONO%: 9 % (ref 0.0–14.0)
NEUT#: 2.9 10*3/uL (ref 1.5–6.5)
RBC: 4.16 10*6/uL (ref 3.70–5.45)
RDW: 13.2 % (ref 11.2–14.5)

## 2011-09-21 LAB — CMP (CANCER CENTER ONLY)
ALT(SGPT): 26 U/L (ref 10–47)
Albumin: 3.4 g/dL (ref 3.3–5.5)
BUN, Bld: 16 mg/dL (ref 7–22)
CO2: 28 mEq/L (ref 18–33)
Calcium: 8.7 mg/dL (ref 8.0–10.3)
Chloride: 101 mEq/L (ref 98–108)
Creat: 1 mg/dl (ref 0.6–1.2)

## 2011-09-21 MED ORDER — IOHEXOL 300 MG/ML  SOLN
100.0000 mL | Freq: Once | INTRAMUSCULAR | Status: AC | PRN
  Administered 2011-09-21: 100 mL via INTRAVENOUS

## 2011-09-26 ENCOUNTER — Ambulatory Visit (HOSPITAL_BASED_OUTPATIENT_CLINIC_OR_DEPARTMENT_OTHER): Payer: Managed Care, Other (non HMO) | Admitting: Internal Medicine

## 2011-09-26 ENCOUNTER — Ambulatory Visit (HOSPITAL_BASED_OUTPATIENT_CLINIC_OR_DEPARTMENT_OTHER): Payer: Managed Care, Other (non HMO)

## 2011-09-26 ENCOUNTER — Other Ambulatory Visit: Payer: Managed Care, Other (non HMO) | Admitting: Lab

## 2011-09-26 VITALS — BP 137/91 | HR 105 | Temp 97.0°F | Ht 64.0 in | Wt 162.4 lb

## 2011-09-26 VITALS — BP 135/90 | HR 88 | Temp 97.2°F

## 2011-09-26 DIAGNOSIS — C3490 Malignant neoplasm of unspecified part of unspecified bronchus or lung: Secondary | ICD-10-CM

## 2011-09-26 DIAGNOSIS — R21 Rash and other nonspecific skin eruption: Secondary | ICD-10-CM

## 2011-09-26 DIAGNOSIS — C7951 Secondary malignant neoplasm of bone: Secondary | ICD-10-CM

## 2011-09-26 DIAGNOSIS — C3430 Malignant neoplasm of lower lobe, unspecified bronchus or lung: Secondary | ICD-10-CM

## 2011-09-26 MED ORDER — DENOSUMAB 120 MG/1.7ML ~~LOC~~ SOLN
120.0000 mg | Freq: Once | SUBCUTANEOUS | Status: AC
  Administered 2011-09-26: 120 mg via SUBCUTANEOUS
  Filled 2011-09-26: qty 1.7

## 2011-09-26 NOTE — Patient Instructions (Signed)
Call MD for problems 

## 2011-09-26 NOTE — Progress Notes (Signed)
[facility] OFFICE PROGRESS NOTE  [name], MD, MD [address] [facility], [place]. [place] [address]  DIAGNOSIS: Metastatic non-small cell lung cancer adenocarcinoma, diagnosed in December 2010 in a patient with a remote history of smoking and positive EGFR mutation.   PRIOR THERAPY:  1. Status post palliative radiotherapy to the right hilum as well as anterior inferior ribs and lumbar spine from L2-L5 under the care Dr. Shon Baton.  2. status post right hip prophylactic trochanteric femoral nail and under the care of Dr. Eulas Post   CURRENT THERAPY:  1. Tarceva at 150 mg by mouth daily status post 24 months of treatment  2. Xgeva 120 mg subcutaneously given on a monthly basis for bone metastasis status post 2 treatments   INTERVAL HISTORY: Makayla [name] 60 y.o. female returns to the clinic today for followup visit accompanied by her sister and son. The patient is feeling much better today. The patient in the right hip area decreased. She started him treatment with Tarceva fairly well. She has only mild skin rash on the face and some on the scalp. She denied having any significant diarrhea. She has no nausea or vomiting, no weight loss or night sweats. The patient has repeat CT scan of the head, chest, abdomen and pelvis performed recently and she is here for evaluation and discussion of her scan results.  MEDICAL HISTORY: Past Medical History  Diagnosis Date  . Lung cancer     lung ca dx 12/10  . COPD (chronic obstructive pulmonary disease)   . Lung cancer 09/01/2011    ALLERGIES:  is allergic to sulfonamide derivatives.  MEDICATIONS:  Current Outpatient Prescriptions  Medication Sig Dispense Refill  . ALPRAZolam (XANAX) 0.5 MG tablet Take 0.5 mg by mouth as needed.        . calcium-vitamin D (OSCAL WITH D) 500-200 MG-UNIT per tablet Take 1 tablet by mouth daily.        . Cholecalciferol (VITAMIN D-3 PO) Take 1,000 Units by mouth 2  (two) times daily.        Marland Kitchen docusate sodium (COLACE) 100 MG capsule Take 100 mg by mouth as needed.        . erlotinib (TARCEVA) 150 MG tablet Take 1 tablet (150 mg total) by mouth daily. Take on an empty stomach 1 hour before meals or 2 hours after.  30 tablet  2  . ferrous sulfate 325 (65 FE) MG tablet Take 325 mg by mouth 2 (two) times daily.        . methocarbamol (ROBAXIN) 500 MG tablet Take 500 mg by mouth 4 (four) times daily.        Marland Kitchen morphine (MS CONTIN) 15 MG 12 hr tablet Take 1 tablet (15 mg total) by mouth 2 (two) times daily.  60 tablet  0  . oxyCODONE-acetaminophen (PERCOCET) 5-325 MG per tablet Take 1 tablet by mouth every 6 (six) hours as needed.        . clindamycin (CLEOCIN T) 1 % lotion Apply topically 2 (two) times daily.        Marland Kitchen HYDROcodone-acetaminophen (VICODIN) 5-500 MG per tablet Take 1 tablet by mouth every 6 (six) hours as needed.        . Iron-Vit C-Vit B12-Folic Acid (IRON 100 PLUS PO) Take 65 mg by mouth daily. OTC       . prochlorperazine (COMPAZINE) 10 MG tablet Take 10 mg by mouth every 6 (six) hours as needed.        Marland Kitchen  warfarin (COUMADIN) 5 MG tablet Take 5 mg by mouth daily.          SURGICAL HISTORY:  Past Surgical History  Procedure Date  . Abdominal hysterectomy     REVIEW OF SYSTEMS:  A comprehensive review of systems was negative except for: Integument/breast: positive for rash   PHYSICAL EXAMINATION: General appearance: alert, cooperative and no distress Head: Normocephalic, without obvious abnormality, atraumatic Neck: no adenopathy Lymph nodes: Cervical, supraclavicular, and axillary nodes normal. Resp: clear to auscultation bilaterally Cardio: regular rate and rhythm, S1, S2 normal, no murmur, click, rub or gallop GI: soft, non-tender; bowel sounds normal; no masses,  no organomegaly Extremities: extremities normal, atraumatic, no cyanosis or edema Neurologic: Alert and oriented X 3, normal strength and tone. Normal symmetric reflexes. Normal  coordination and gait  ECOG PERFORMANCE STATUS: 1 - Symptomatic but completely ambulatory  Blood pressure 137/91, pulse 105, temperature 97 F (36.1 C), temperature source Oral, height 5\' 4"  (1.626 m), weight 162 lb 6.4 oz (73.664 kg).  LABORATORY DATA: Lab Results  Component Value Date   WBC 4.6 09/21/2011   HGB 12.7 09/21/2011   HCT 36.7 09/21/2011   MCV 88.2 09/21/2011   PLT 219 09/21/2011      Chemistry      Component Value Date/Time   NA 137 09/21/2011 0902   NA 138 09/01/2011 0957   NA 138 09/01/2011 0957   K 4.2 09/21/2011 0902   K 4.0 09/01/2011 0957   K 4.0 09/01/2011 0957   CL 101 09/21/2011 0902   CL 105 09/01/2011 0957   CL 105 09/01/2011 0957   CO2 28 09/21/2011 0902   CO2 24 09/01/2011 0957   CO2 24 09/01/2011 0957   BUN 16 09/21/2011 0902   BUN 11 09/01/2011 0957   BUN 11 09/01/2011 0957   CREATININE 1.0 09/21/2011 0902   CREATININE 0.84 09/01/2011 0957   CREATININE 0.84 09/01/2011 0957      Component Value Date/Time   CALCIUM 8.7 09/21/2011 0902   CALCIUM 8.8 09/01/2011 0957   CALCIUM 8.8 09/01/2011 0957   ALKPHOS 130* 09/21/2011 0902   ALKPHOS 151* 09/01/2011 0957   ALKPHOS 151* 09/01/2011 0957   AST 27 09/21/2011 0902   AST 22 09/01/2011 0957   AST 22 09/01/2011 0957   ALT 11 09/01/2011 0957   ALT 11 09/01/2011 0957   BILITOT 0.60 09/21/2011 0902   BILITOT 0.4 09/01/2011 0957   BILITOT 0.4 09/01/2011 0957       RADIOGRAPHIC STUDIES: Ct Head W Wo Contrast  09/21/2011  *RADIOLOGY REPORT*  Clinical Data:  Metastatic lung cancer.  Bone metastasis. Radiation complete 2010.  CT HEAD WITHOUT AND WITH CONTRAST CT CHEST, ABDOMEN AND PELVIS WITH CONTRAST  Technique:  Multidetector CT imaging of the head was performed following the standard protocol before and after bolus injection of intravenous contrast. Multidetector CT imaging of the chest, abdomen and pelvis was performed following the standard protocol during bolus administration of  intravenous contrast.  Contrast: OMNIPAQUE IOHEXOL 300 MG/ML IV SOLN  Comparison:  CT 06/03/2011 bone scan 05/25/2011   CT HEAD  Findings: There are no enhancing lesions within the brain parenchyma, ventricles, or meningeal surfaces to suggest metastasis.  No intracranial hemorrhage.  Orbits are normal.  No osseous calvarial lesion.  IMPRESSION: No evidence of brain metastasis.   CT CHEST  Findings: No axillary or supraclavicular lymphadenopathy.  No mediastinal lymphadenopathy.  Postsurgical change consistent with right pneumonectomy.  There is small amount of residual  collapse lung tissue at the right lung base which does enhance but is unchanged from prior.  There the left lung is expanded.  No suspicious pulmonary nodules.  IMPRESSION:  1.  No evidence of thoracic metastasis. 2.  Stable postoperative change consistent with right lobectomy.   CT ABDOMEN AND PELVIS  Findings: No focal hepatic lesion.  The gallbladder, pancreas, spleen, adrenal glands, and kidneys are normal.  Small hiatal hernia is present.  The stomach, small bowel, and colon are normal.  Abdominal aorta is normal caliber.  No retroperitoneal periportal lymphadenopathy.  No free fluid the pelvis.  Post hysterectomy anatomy.  The bladder is normal.  No pelvic lymphadenopathy.  Review of the bone windows demonstrates a prosthetic in the right proximal femur.  There is a sclerotic metastasis throughout the sacrum and pelvis as well as the lumbar spine which are not changed from prior.  A compression fracture of mid thoracic spine is also unchanged.  IMPRESSION:  1.  No evidence of metastasis in the abdomen or  pelvis.  2.   Stable sclerotic metastasis within the spine and pelvis.  Original Report Authenticated By: Genevive Bi, M.D.    ASSESSMENT: This is a very pleasant 60 years old white female with metastatic non-small cell lung cancer, adenocarcinoma with positive EGFR mutation diagnosed 2 years ago. The patient is currently on  treatment with Tarceva. She is tolerating it fairly well with no evidence for disease progression. I discussed the scan results with the patient and her family.   PLAN: I recommended for her to continue treatment with Tarceva at the current dose. She will also continue on treatment with Xgeva for the bone metastases. She would come back for followup visit in one month's. She was advised to call immediately she has any concerning symptoms in the interval.   All questions were answered. The patient knows to call the clinic with any problems, questions or concerns. We can certainly see the patient much sooner if necessary.

## 2011-09-29 ENCOUNTER — Ambulatory Visit: Payer: Managed Care, Other (non HMO)

## 2011-10-11 ENCOUNTER — Other Ambulatory Visit: Payer: Self-pay | Admitting: Internal Medicine

## 2011-10-11 DIAGNOSIS — C3490 Malignant neoplasm of unspecified part of unspecified bronchus or lung: Secondary | ICD-10-CM

## 2011-10-11 MED ORDER — MORPHINE SULFATE CR 15 MG PO TB12: 15.0000 mg | ORAL_TABLET | Freq: Two times a day (BID) | ORAL | Status: DC

## 2011-10-11 NOTE — Telephone Encounter (Signed)
Refill ordered , signed and put up front for pickup tomorrow

## 2011-10-18 ENCOUNTER — Other Ambulatory Visit: Payer: Self-pay | Admitting: Internal Medicine

## 2011-10-18 ENCOUNTER — Other Ambulatory Visit: Payer: Self-pay | Admitting: Medical Oncology

## 2011-10-18 DIAGNOSIS — C3490 Malignant neoplasm of unspecified part of unspecified bronchus or lung: Secondary | ICD-10-CM

## 2011-10-18 MED ORDER — HYDROCODONE-ACETAMINOPHEN 5-500 MG PO TABS: 1.0000 | ORAL_TABLET | Freq: Four times a day (QID) | ORAL | Status: DC | PRN

## 2011-10-18 NOTE — Telephone Encounter (Signed)
Pt left message regarding refill which I have questions about . [id number]-I called her back and left message to call me

## 2011-10-19 NOTE — Telephone Encounter (Signed)
Pt said issue has been taken care of . She is no longer on percocet

## 2011-10-24 ENCOUNTER — Ambulatory Visit (HOSPITAL_BASED_OUTPATIENT_CLINIC_OR_DEPARTMENT_OTHER): Payer: Managed Care, Other (non HMO)

## 2011-10-24 ENCOUNTER — Other Ambulatory Visit: Payer: Managed Care, Other (non HMO) | Admitting: Lab

## 2011-10-24 ENCOUNTER — Telehealth: Payer: Self-pay | Admitting: Internal Medicine

## 2011-10-24 ENCOUNTER — Ambulatory Visit (HOSPITAL_BASED_OUTPATIENT_CLINIC_OR_DEPARTMENT_OTHER): Payer: Managed Care, Other (non HMO) | Admitting: Physician Assistant

## 2011-10-24 ENCOUNTER — Encounter: Payer: Self-pay | Admitting: Physician Assistant

## 2011-10-24 VITALS — BP 118/79 | HR 86 | Temp 98.5°F | Ht 64.0 in | Wt 162.9 lb

## 2011-10-24 DIAGNOSIS — C3490 Malignant neoplasm of unspecified part of unspecified bronchus or lung: Secondary | ICD-10-CM

## 2011-10-24 DIAGNOSIS — R197 Diarrhea, unspecified: Secondary | ICD-10-CM

## 2011-10-24 DIAGNOSIS — C3430 Malignant neoplasm of lower lobe, unspecified bronchus or lung: Secondary | ICD-10-CM

## 2011-10-24 DIAGNOSIS — C7951 Secondary malignant neoplasm of bone: Secondary | ICD-10-CM

## 2011-10-24 DIAGNOSIS — C7952 Secondary malignant neoplasm of bone marrow: Secondary | ICD-10-CM

## 2011-10-24 DIAGNOSIS — L270 Generalized skin eruption due to drugs and medicaments taken internally: Secondary | ICD-10-CM

## 2011-10-24 LAB — COMPREHENSIVE METABOLIC PANEL
ALT: 17 U/L (ref 0–35)
AST: 23 U/L (ref 0–37)
Albumin: 4 g/dL (ref 3.5–5.2)
CO2: 23 mEq/L (ref 19–32)
Calcium: 9.5 mg/dL (ref 8.4–10.5)
Chloride: 105 mEq/L (ref 96–112)
Creatinine, Ser: 0.81 mg/dL (ref 0.50–1.10)
Potassium: 4.1 mEq/L (ref 3.5–5.3)
Sodium: 139 mEq/L (ref 135–145)
Total Protein: 6.3 g/dL (ref 6.0–8.3)

## 2011-10-24 LAB — CBC WITH DIFFERENTIAL/PLATELET
BASO%: 0.4 % (ref 0.0–2.0)
HCT: 37 % (ref 34.8–46.6)
MCHC: 34.6 g/dL (ref 31.5–36.0)
MONO#: 0.4 10*3/uL (ref 0.1–0.9)
NEUT%: 61.7 % (ref 38.4–76.8)
RDW: 13.9 % (ref 11.2–14.5)
WBC: 4.2 10*3/uL (ref 3.9–10.3)
lymph#: 1.1 10*3/uL (ref 0.9–3.3)

## 2011-10-24 MED ORDER — DENOSUMAB 120 MG/1.7ML ~~LOC~~ SOLN
120.0000 mg | Freq: Once | SUBCUTANEOUS | Status: AC
  Administered 2011-10-24: 120 mg via SUBCUTANEOUS
  Filled 2011-10-24: qty 1.7

## 2011-10-24 NOTE — Telephone Encounter (Signed)
gv pt appt schedule for feb °

## 2011-10-24 NOTE — Progress Notes (Signed)
[facility] OFFICE PROGRESS NOTE  [name], MD, MD [address] [facility], [place]. [place] [address]  DIAGNOSIS: Metastatic non-small cell lung cancer adenocarcinoma, diagnosed in December 2010 in a patient with a remote history of smoking and positive EGFR mutation.   PRIOR THERAPY:  1. Status post palliative radiotherapy to the right hilum as well as anterior inferior ribs and lumbar spine from L2-L5 under the care Dr. Shon Baton.  2. status post right hip prophylactic trochanteric femoral nail and under the care of Dr. Eulas Post   CURRENT THERAPY:  1. Tarceva at 150 mg by mouth daily status post 24 months of treatment  2. Xgeva 120 mg subcutaneously given on a monthly basis for bone metastasis status post 3 treatments   INTERVAL HISTORY: Makayla [name] 61 y.o. female returns to the clinic today for followup visit accompanied by her sister and son. Overall she is doing well. She has generally less right hip pain although she continues to have some pain in this area as well as some lower back pain. She continues on her MS Contin with occasional Vicodin for breakthrough pain with good relief. She has rare episodes of constipation treated well with over-the-counter senna. She reports she had her mammogram in December and was told initially that things were fine but didn't call to go in and have further images done of the right breast. She has not schedule this appointment as yet, as she states that she had a similar experience with her left breast and it turned out to be "nothing".   MEDICAL HISTORY: Past Medical History  Diagnosis Date  . Lung cancer     lung ca dx 12/10  . COPD (chronic obstructive pulmonary disease)   . Lung cancer 09/01/2011    ALLERGIES:  is allergic to sulfonamide derivatives.  MEDICATIONS:  Current Outpatient Prescriptions  Medication Sig Dispense Refill  . ALPRAZolam (XANAX) 0.5 MG tablet Take 0.5 mg by mouth as  needed.        . calcium-vitamin D (OSCAL WITH D) 500-200 MG-UNIT per tablet Take 1 tablet by mouth daily.        . Cholecalciferol (VITAMIN D-3 PO) Take 1,000 Units by mouth 2 (two) times daily.        . clindamycin (CLEOCIN T) 1 % lotion Apply topically 2 (two) times daily.        Marland Kitchen docusate sodium (COLACE) 100 MG capsule Take 100 mg by mouth as needed.        . erlotinib (TARCEVA) 150 MG tablet Take 1 tablet (150 mg total) by mouth daily. Take on an empty stomach 1 hour before meals or 2 hours after.  30 tablet  2  . ferrous sulfate 325 (65 FE) MG tablet Take 325 mg by mouth 2 (two) times daily.        Marland Kitchen HYDROcodone-acetaminophen (VICODIN) 5-500 MG per tablet Take 1 tablet by mouth every 6 (six) hours as needed.  60 tablet  1  . Iron-Vit C-Vit B12-Folic Acid (IRON 100 PLUS PO) Take 65 mg by mouth daily. OTC       . morphine (MS CONTIN) 15 MG 12 hr tablet Take 1 tablet (15 mg total) by mouth 2 (two) times daily.  60 tablet  0  . prochlorperazine (COMPAZINE) 10 MG tablet Take 10 mg by mouth every 6 (six) hours as needed.        . methocarbamol (ROBAXIN) 500 MG tablet Take 500 mg by mouth  4 (four) times daily.        Marland Kitchen oxyCODONE-acetaminophen (PERCOCET) 5-325 MG per tablet Take 1 tablet by mouth every 6 (six) hours as needed.        . warfarin (COUMADIN) 5 MG tablet Take 5 mg by mouth daily.         No current facility-administered medications for this visit.   Facility-Administered Medications Ordered in Other Visits  Medication Dose Route Frequency Provider Last Rate Last Dose  . denosumab (XGEVA) injection 120 mg  120 mg Subcutaneous Once Conni Slipper, PA        SURGICAL HISTORY:  Past Surgical History  Procedure Date  . Abdominal hysterectomy     REVIEW OF SYSTEMS:  A comprehensive review of systems was negative except for: Integument/breast: positive for rash and Dryness and similar leesiions to those on the  face, afffectting the  scalp   PHYSICAL EXAMINATION: General  appearance: alert, cooperative and no distress Head: Normocephalic, without obvious abnormality, atraumatic Neck: no adenopathy Lymph nodes: Cervical, supraclavicular, and axillary nodes normal. Resp: clear to auscultation bilaterally Cardio: regular rate and rhythm, S1, S2 normal, no murmur, click, rub or gallop GI: soft, non-tender; bowel sounds normal; no masses,  no organomegaly Extremities: extremities normal, atraumatic, no cyanosis or edema Neurologic: Alert and oriented X 3, normal strength and tone. Normal symmetric reflexes. Normal coordination and gait Skin: Grade I acneform eruptions over the face, nose, cheeks and forehead and scalp. No evidence for super infection  ECOG PERFORMANCE STATUS: 1 - Symptomatic but completely ambulatory  Blood pressure 118/79, pulse 86, temperature 98.5 F (36.9 C), temperature source Oral, height 5\' 4"  (1.626 m), weight 162 lb 14.4 oz (73.891 kg).  LABORATORY DATA: Lab Results  Component Value Date   WBC 4.2 10/24/2011   HGB 12.8 10/24/2011   HCT 37.0 10/24/2011   MCV 87.7 10/24/2011   PLT 212 10/24/2011      Chemistry      Component Value Date/Time   NA 137 09/21/2011 0902   NA 138 09/01/2011 0957   NA 138 09/01/2011 0957   K 4.2 09/21/2011 0902   K 4.0 09/01/2011 0957   K 4.0 09/01/2011 0957   CL 101 09/21/2011 0902   CL 105 09/01/2011 0957   CL 105 09/01/2011 0957   CO2 28 09/21/2011 0902   CO2 24 09/01/2011 0957   CO2 24 09/01/2011 0957   BUN 16 09/21/2011 0902   BUN 11 09/01/2011 0957   BUN 11 09/01/2011 0957   CREATININE 1.0 09/21/2011 0902   CREATININE 0.84 09/01/2011 0957   CREATININE 0.84 09/01/2011 0957      Component Value Date/Time   CALCIUM 8.7 09/21/2011 0902   CALCIUM 8.8 09/01/2011 0957   CALCIUM 8.8 09/01/2011 0957   ALKPHOS 130* 09/21/2011 0902   ALKPHOS 151* 09/01/2011 0957   ALKPHOS 151* 09/01/2011 0957   AST 27 09/21/2011 0902   AST 22 09/01/2011 0957   AST 22 09/01/2011 0957   ALT 11 09/01/2011 0957     ALT 11 09/01/2011 0957   BILITOT 0.60 09/21/2011 0902   BILITOT 0.4 09/01/2011 0957   BILITOT 0.4 09/01/2011 0957       RADIOGRAPHIC STUDIES: Ct Head W Wo Contrast  09/21/2011  *RADIOLOGY REPORT*  Clinical Data:  Metastatic lung cancer.  Bone metastasis. Radiation complete 2010.  CT HEAD WITHOUT AND WITH CONTRAST CT CHEST, ABDOMEN AND PELVIS WITH CONTRAST  Technique:  Multidetector CT imaging of the head was performed following the  standard protocol before and after bolus injection of intravenous contrast. Multidetector CT imaging of the chest, abdomen and pelvis was performed following the standard protocol during bolus administration of intravenous contrast.  Contrast: OMNIPAQUE IOHEXOL 300 MG/ML IV SOLN  Comparison:  CT 06/03/2011 bone scan 05/25/2011   CT HEAD  Findings: There are no enhancing lesions within the brain parenchyma, ventricles, or meningeal surfaces to suggest metastasis.  No intracranial hemorrhage.  Orbits are normal.  No osseous calvarial lesion.  IMPRESSION: No evidence of brain metastasis.   CT CHEST  Findings: No axillary or supraclavicular lymphadenopathy.  No mediastinal lymphadenopathy.  Postsurgical change consistent with right pneumonectomy.  There is small amount of residual collapse lung tissue at the right lung base which does enhance but is unchanged from prior.  There the left lung is expanded.  No suspicious pulmonary nodules.  IMPRESSION:  1.  No evidence of thoracic metastasis. 2.  Stable postoperative change consistent with right lobectomy.   CT ABDOMEN AND PELVIS  Findings: No focal hepatic lesion.  The gallbladder, pancreas, spleen, adrenal glands, and kidneys are normal.  Small hiatal hernia is present.  The stomach, small bowel, and colon are normal.  Abdominal aorta is normal caliber.  No retroperitoneal periportal lymphadenopathy.  No free fluid the pelvis.  Post hysterectomy anatomy.  The bladder is normal.  No pelvic lymphadenopathy.  Review of the  bone windows demonstrates a prosthetic in the right proximal femur.  There is a sclerotic metastasis throughout the sacrum and pelvis as well as the lumbar spine which are not changed from prior.  A compression fracture of mid thoracic spine is also unchanged.  IMPRESSION:  1.  No evidence of metastasis in the abdomen or  pelvis.  2.   Stable sclerotic metastasis within the spine and pelvis.  Original Report Authenticated By: Genevive Bi, M.D.    ASSESSMENT/PLAN: This is a very pleasant 61 years old white female with metastatic non-small cell lung cancer, adenocarcinoma with positive EGFR mutation diagnosed 2 years ago. The patient is currently on treatment with Tarceva. She continues to tolerate the Tarceva relatively well with the exception of mild skin rash. She currently having a problem diarrhea. Thus far she is tolerating BX G. that without difficulty as well. She is to start using the clindamycin solution on her scalp as well as the other areas that are affected by the grade 1 skin rash associated with the Tarceva therapy. She'll followup in one month with a repeat CBC differential and C. met  Makayla [name] E, PA-C   All questions were answered. The patient knows to call the clinic with any problems, questions or concerns. We can certainly see the patient much sooner if necessary.

## 2011-10-31 ENCOUNTER — Other Ambulatory Visit: Payer: Self-pay

## 2011-10-31 DIAGNOSIS — C3490 Malignant neoplasm of unspecified part of unspecified bronchus or lung: Secondary | ICD-10-CM

## 2011-10-31 MED ORDER — HYDROCODONE-ACETAMINOPHEN 5-500 MG PO TABS: 1.0000 | ORAL_TABLET | Freq: Four times a day (QID) | ORAL | Status: DC | PRN

## 2011-11-09 ENCOUNTER — Other Ambulatory Visit: Payer: Self-pay | Admitting: Internal Medicine

## 2011-11-09 DIAGNOSIS — C3490 Malignant neoplasm of unspecified part of unspecified bronchus or lung: Secondary | ICD-10-CM

## 2011-11-09 MED ORDER — MORPHINE SULFATE CR 15 MG PO TB12: 15.0000 mg | ORAL_TABLET | Freq: Two times a day (BID) | ORAL | Status: DC

## 2011-11-09 NOTE — Telephone Encounter (Signed)
Refill left and ready for pickup -pt notified

## 2011-11-09 NOTE — Telephone Encounter (Signed)
Left message at [facility] that Vicodan rx was filled on 10/31/11 as above and to please call .

## 2011-11-16 ENCOUNTER — Other Ambulatory Visit: Payer: Self-pay | Admitting: *Deleted

## 2011-11-16 DIAGNOSIS — C3490 Malignant neoplasm of unspecified part of unspecified bronchus or lung: Secondary | ICD-10-CM

## 2011-11-16 MED ORDER — ERLOTINIB HCL 150 MG PO TABS: 150.0000 mg | ORAL_TABLET | Freq: Every day | ORAL | Status: DC

## 2011-11-16 NOTE — Progress Notes (Signed)
[facility] faxed refill request for Tarceva.  Request to providers for review.

## 2011-11-21 ENCOUNTER — Telehealth: Payer: Self-pay | Admitting: Internal Medicine

## 2011-11-21 ENCOUNTER — Ambulatory Visit (HOSPITAL_BASED_OUTPATIENT_CLINIC_OR_DEPARTMENT_OTHER): Payer: Managed Care, Other (non HMO)

## 2011-11-21 ENCOUNTER — Other Ambulatory Visit: Payer: Managed Care, Other (non HMO) | Admitting: Lab

## 2011-11-21 ENCOUNTER — Ambulatory Visit: Payer: Managed Care, Other (non HMO) | Admitting: Physician Assistant

## 2011-11-21 VITALS — BP 124/86 | HR 100 | Temp 97.1°F | Ht 64.0 in | Wt 160.4 lb

## 2011-11-21 DIAGNOSIS — C3490 Malignant neoplasm of unspecified part of unspecified bronchus or lung: Secondary | ICD-10-CM

## 2011-11-21 LAB — CBC WITH DIFFERENTIAL/PLATELET
BASO%: 0.3 % (ref 0.0–2.0)
EOS%: 0.6 % (ref 0.0–7.0)
MCH: 30.2 pg (ref 25.1–34.0)
MCHC: 34.6 g/dL (ref 31.5–36.0)
MONO#: 0.2 10*3/uL (ref 0.1–0.9)
RBC: 4.42 10*6/uL (ref 3.70–5.45)
RDW: 14.2 % (ref 11.2–14.5)
WBC: 5.6 10*3/uL (ref 3.9–10.3)
lymph#: 1 10*3/uL (ref 0.9–3.3)

## 2011-11-21 LAB — COMPREHENSIVE METABOLIC PANEL
ALT: 15 U/L (ref 0–35)
AST: 23 U/L (ref 0–37)
CO2: 21 mEq/L (ref 19–32)
Calcium: 9.1 mg/dL (ref 8.4–10.5)
Chloride: 100 mEq/L (ref 96–112)
Creatinine, Ser: 0.89 mg/dL (ref 0.50–1.10)
Sodium: 135 mEq/L (ref 135–145)
Total Bilirubin: 0.6 mg/dL (ref 0.3–1.2)
Total Protein: 7.2 g/dL (ref 6.0–8.3)

## 2011-11-21 MED ORDER — DENOSUMAB 120 MG/1.7ML ~~LOC~~ SOLN
120.0000 mg | Freq: Once | SUBCUTANEOUS | Status: AC
  Administered 2011-11-21: 120 mg via SUBCUTANEOUS
  Filled 2011-11-21: qty 1.7

## 2011-11-21 NOTE — Telephone Encounter (Signed)
Gv pt appt for march2013.  scheduled ct scan for 03/15 @ [facility]

## 2011-11-22 ENCOUNTER — Encounter: Payer: Self-pay | Admitting: Physician Assistant

## 2011-11-22 NOTE — Progress Notes (Signed)
[facility] OFFICE PROGRESS NOTE  [name], MD, MD [address] [facility], [place]. [place] [address]  DIAGNOSIS: Metastatic non-small cell lung cancer adenocarcinoma, diagnosed in December 2010 in a patient with a remote history of smoking and positive EGFR mutation.   PRIOR THERAPY:  1. Status post palliative radiotherapy to the right hilum as well as anterior inferior ribs and lumbar spine from L2-L5 under the care Dr. Shon Baton.  2. status post right hip prophylactic trochanteric femoral nail and under the care of Dr. Eulas Post   CURRENT THERAPY:  1. Tarceva at 150 mg by mouth daily status post 25 months of treatment  2. Xgeva 120 mg subcutaneously given on a monthly basis for bone metastasis status post 4 treatments   INTERVAL HISTORY: Makayla [name] 61 y.o. female returns to the clinic today for followup visit accompanied by her sister and son. Overall she is doing well. She does however continue to have some hip pain and now pain across her lower back. She reports that she is not limping as much. She still has to take 5 or 6 Vicodin tablets daily in addition to her MS Contin 15 mg twice a day. She occasionally has a sharp pain from the left hip extending into the ankle. This is the opposite hip that was operated on. She notes decreased appetite and has lost a couple pounds since last being seen. She denied any significant problems with diarrhea or skin rash related to the Tarceva therapy.    MEDICAL HISTORY: Past Medical History  Diagnosis Date  . Lung cancer     lung ca dx 12/10  . COPD (chronic obstructive pulmonary disease)   . Lung cancer 09/01/2011    ALLERGIES:  is allergic to sulfonamide derivatives.  MEDICATIONS:  Current Outpatient Prescriptions  Medication Sig Dispense Refill  . ALPRAZolam (XANAX) 0.5 MG tablet Take 0.5 mg by mouth as needed.        . calcium-vitamin D (OSCAL WITH D) 500-200 MG-UNIT per tablet Take 1  tablet by mouth daily.        . Cholecalciferol (VITAMIN D-3 PO) Take 1,000 Units by mouth 2 (two) times daily.        . clindamycin (CLEOCIN T) 1 % lotion Apply topically 2 (two) times daily.        Marland Kitchen docusate sodium (COLACE) 100 MG capsule Take 100 mg by mouth as needed.        . erlotinib (TARCEVA) 150 MG tablet Take 1 tablet (150 mg total) by mouth daily. Take on an empty stomach 1 hour before meals or 2 hours after.  30 tablet  2  . ferrous sulfate 325 (65 FE) MG tablet Take 325 mg by mouth 2 (two) times daily.        Marland Kitchen HYDROcodone-acetaminophen (VICODIN) 5-500 MG per tablet Take 1 tablet by mouth every 6 (six) hours as needed.  60 tablet  0  . Iron-Vit C-Vit B12-Folic Acid (IRON 100 PLUS PO) Take 65 mg by mouth daily. OTC       . methocarbamol (ROBAXIN) 500 MG tablet Take 500 mg by mouth 4 (four) times daily.        Marland Kitchen morphine (MS CONTIN) 15 MG 12 hr tablet Take 1 tablet (15 mg total) by mouth 2 (two) times daily.  60 tablet  0  . oxyCODONE-acetaminophen (PERCOCET) 5-325 MG per tablet Take 1 tablet by mouth every 6 (six) hours as needed.        Marland Kitchen  prochlorperazine (COMPAZINE) 10 MG tablet Take 10 mg by mouth every 6 (six) hours as needed.        . warfarin (COUMADIN) 5 MG tablet Take 5 mg by mouth daily.          SURGICAL HISTORY:  Past Surgical History  Procedure Date  . Abdominal hysterectomy     REVIEW OF SYSTEMS:  A comprehensive review of systems was negative except for: Integument/breast: positive for rash and Dryness and similar leesiions to those on the  face, afffectting the  scalp Musculoskeletal: positive for back pain and Hip pain   PHYSICAL EXAMINATION: General appearance: alert, cooperative and no distress Head: Normocephalic, without obvious abnormality, atraumatic Neck: no adenopathy Lymph nodes: Cervical, supraclavicular, and axillary nodes normal. Resp: clear to auscultation bilaterally Cardio: regular rate and rhythm, S1, S2 normal, no murmur, click, rub or  gallop GI: soft, non-tender; bowel sounds normal; no masses,  no organomegaly Extremities: extremities normal, atraumatic, no cyanosis or edema Neurologic: Alert and oriented X 3, normal strength and tone. Normal symmetric reflexes. Normal coordination and gait Skin: Grade I acneform eruptions over the face, nose, cheeks and forehead and scalp. No evidence for super infection  ECOG PERFORMANCE STATUS: 1 - Symptomatic but completely ambulatory  Blood pressure 124/86, pulse 100, temperature 97.1 F (36.2 C), temperature source Oral, height 5\' 4"  (1.626 m), weight 160 lb 6.4 oz (72.757 kg).  LABORATORY DATA: Lab Results  Component Value Date   WBC 5.6 11/21/2011   HGB 13.3 11/21/2011   HCT 38.5 11/21/2011   MCV 87.2 11/21/2011   PLT 209 11/21/2011      Chemistry      Component Value Date/Time   NA 135 11/21/2011 1421   NA 137 09/21/2011 0902   K 4.1 11/21/2011 1421   K 4.2 09/21/2011 0902   CL 100 11/21/2011 1421   CL 101 09/21/2011 0902   CO2 21 11/21/2011 1421   CO2 28 09/21/2011 0902   BUN 13 11/21/2011 1421   BUN 16 09/21/2011 0902   CREATININE 0.89 11/21/2011 1421   CREATININE 1.0 09/21/2011 0902      Component Value Date/Time   CALCIUM 9.1 11/21/2011 1421   CALCIUM 8.7 09/21/2011 0902   ALKPHOS 128* 11/21/2011 1421   ALKPHOS 130* 09/21/2011 0902   AST 23 11/21/2011 1421   AST 27 09/21/2011 0902   ALT 15 11/21/2011 1421   BILITOT 0.6 11/21/2011 1421   BILITOT 0.60 09/21/2011 0902       RADIOGRAPHIC STUDIES: Ct Head W Wo Contrast  09/21/2011  *RADIOLOGY REPORT*  Clinical Data:  Metastatic lung cancer.  Bone metastasis. Radiation complete 2010.  CT HEAD WITHOUT AND WITH CONTRAST CT CHEST, ABDOMEN AND PELVIS WITH CONTRAST  Technique:  Multidetector CT imaging of the head was performed following the standard protocol before and after bolus injection of intravenous contrast. Multidetector CT imaging of the chest, abdomen and pelvis was performed following the standard protocol  during bolus administration of intravenous contrast.  Contrast: OMNIPAQUE IOHEXOL 300 MG/ML IV SOLN  Comparison:  CT 06/03/2011 bone scan 05/25/2011   CT HEAD  Findings: There are no enhancing lesions within the brain parenchyma, ventricles, or meningeal surfaces to suggest metastasis.  No intracranial hemorrhage.  Orbits are normal.  No osseous calvarial lesion.  IMPRESSION: No evidence of brain metastasis.   CT CHEST  Findings: No axillary or supraclavicular lymphadenopathy.  No mediastinal lymphadenopathy.  Postsurgical change consistent with right pneumonectomy.  There is small amount of residual  collapse lung tissue at the right lung base which does enhance but is unchanged from prior.  There the left lung is expanded.  No suspicious pulmonary nodules.  IMPRESSION:  1.  No evidence of thoracic metastasis. 2.  Stable postoperative change consistent with right lobectomy.   CT ABDOMEN AND PELVIS  Findings: No focal hepatic lesion.  The gallbladder, pancreas, spleen, adrenal glands, and kidneys are normal.  Small hiatal hernia is present.  The stomach, small bowel, and colon are normal.  Abdominal aorta is normal caliber.  No retroperitoneal periportal lymphadenopathy.  No free fluid the pelvis.  Post hysterectomy anatomy.  The bladder is normal.  No pelvic lymphadenopathy.  Review of the bone windows demonstrates a prosthetic in the right proximal femur.  There is a sclerotic metastasis throughout the sacrum and pelvis as well as the lumbar spine which are not changed from prior.  A compression fracture of mid thoracic spine is also unchanged.  IMPRESSION:  1.  No evidence of metastasis in the abdomen or  pelvis.  2.   Stable sclerotic metastasis within the spine and pelvis.  Original Report Authenticated By: Genevive Bi, M.D.    ASSESSMENT/PLAN: This is a very pleasant 61 years old white female with metastatic non-small cell lung cancer, adenocarcinoma with positive EGFR mutation diagnosed 2 years  ago. The patient is currently on treatment with Tarceva. She continues to tolerate the Tarceva relatively well with the exception of mild skin rash.  Thus far she is tolerating Xgeva without difficulty as well. The patient was discussed with Dr. Gwenyth Bouillon. We will increase her MS Contin to 30 mg by mouth twice daily. Hopefully this will give her  more level pain control so that she does not have to uses many of the short acting Vicodin. She will likely need prescriptions for both medications within the next week to 10 days. Should the increase in pain medication not take care of her low back pain we will refer her back to radiation oncology for reevaluation for possible palliative radiotherapy to this area. She'll followup with Dr. Gwenyth Bouillon in one month with a repeat CBC differential C. met and CT of the chest abdomen and pelvis with contrast to reevaluate her disease.   Laural Benes, Jerita Wimbush E, PA-C   All questions were answered. The patient knows to call the clinic with any problems, questions or concerns. We can certainly see the patient much sooner if necessary.

## 2011-11-23 ENCOUNTER — Other Ambulatory Visit: Payer: Self-pay | Admitting: *Deleted

## 2011-11-23 DIAGNOSIS — C3490 Malignant neoplasm of unspecified part of unspecified bronchus or lung: Secondary | ICD-10-CM

## 2011-11-23 MED ORDER — HYDROCODONE-ACETAMINOPHEN 5-500 MG PO TABS: 1.0000 | ORAL_TABLET | Freq: Four times a day (QID) | ORAL | Status: DC | PRN

## 2011-11-25 ENCOUNTER — Encounter: Payer: Self-pay | Admitting: Internal Medicine

## 2011-11-29 ENCOUNTER — Other Ambulatory Visit: Payer: Self-pay | Admitting: Internal Medicine

## 2011-11-29 DIAGNOSIS — C3490 Malignant neoplasm of unspecified part of unspecified bronchus or lung: Secondary | ICD-10-CM

## 2011-11-29 DIAGNOSIS — R52 Pain, unspecified: Secondary | ICD-10-CM

## 2011-11-29 MED ORDER — MORPHINE SULFATE CR 15 MG PO TB12: 30.0000 mg | ORAL_TABLET | Freq: Two times a day (BID) | ORAL | Status: DC

## 2011-11-29 MED ORDER — MORPHINE SULFATE CR 30 MG PO TB12: 30.0000 mg | ORAL_TABLET | Freq: Two times a day (BID) | ORAL | Status: DC

## 2011-12-07 ENCOUNTER — Other Ambulatory Visit: Payer: Self-pay | Admitting: *Deleted

## 2011-12-07 DIAGNOSIS — C3490 Malignant neoplasm of unspecified part of unspecified bronchus or lung: Secondary | ICD-10-CM

## 2011-12-07 MED ORDER — HYDROCODONE-ACETAMINOPHEN 5-500 MG PO TABS: 1.0000 | ORAL_TABLET | Freq: Four times a day (QID) | ORAL | Status: DC | PRN

## 2011-12-07 NOTE — Telephone Encounter (Signed)
Patient called back: No falls or increase in pain. Pain is alittle better-can wait 6 hours before taking next dose of hydrocodone instead of 4-5 hours. Still has few on hand, will be out by Friday.

## 2011-12-07 NOTE — Telephone Encounter (Signed)
Refill request for vicodin-last filled #60 on 11/23/11. MS Contin dose increased to 30 mg with hopes she would need less breakthrough med. Tiana Loft, PA requests we follow up with patient to assess her pain level now--has she had any falls, etc? Left message on home voice mail to call office and ask for triage to discuss her pain.

## 2011-12-13 ENCOUNTER — Encounter: Payer: Self-pay | Admitting: Internal Medicine

## 2011-12-13 NOTE — Progress Notes (Signed)
12/13/2011  This patient is scheduled for a chest, abdomen and pelvis ct scan on March 15,2013.  [employer], the clearing house for [facility] had denied the abdomen and pelvis ct scan stating it does not fo follow [facility] Guidelines for Lung Cancer.  The Chest Ct Scan was approved.  Would you like to go Peer-to-Peer with [employer], [phone], opt. 4 or cancel the Abdomen and Pelvis Ct Scan/  JWJX#[id number]  Bonita Quin [phone]

## 2011-12-13 NOTE — Progress Notes (Signed)
PEER review

## 2011-12-16 ENCOUNTER — Ambulatory Visit (HOSPITAL_COMMUNITY)
Admission: RE | Admit: 2011-12-16 | Discharge: 2011-12-16 | Disposition: A | Discharge status: Home / Self Care | Payer: Managed Care, Other (non HMO) | Source: Ambulatory Visit | Attending: Physician Assistant | Admitting: Physician Assistant

## 2011-12-16 DIAGNOSIS — C7951 Secondary malignant neoplasm of bone: Secondary | ICD-10-CM | POA: Insufficient documentation

## 2011-12-16 DIAGNOSIS — C3490 Malignant neoplasm of unspecified part of unspecified bronchus or lung: Secondary | ICD-10-CM | POA: Insufficient documentation

## 2011-12-16 DIAGNOSIS — K225 Diverticulum of esophagus, acquired: Secondary | ICD-10-CM | POA: Insufficient documentation

## 2011-12-16 DIAGNOSIS — J9819 Other pulmonary collapse: Secondary | ICD-10-CM | POA: Insufficient documentation

## 2011-12-16 MED ORDER — IOHEXOL 300 MG/ML  SOLN
100.0000 mL | Freq: Once | INTRAMUSCULAR | Status: AC | PRN
  Administered 2011-12-16: 100 mL via INTRAVENOUS

## 2011-12-19 ENCOUNTER — Other Ambulatory Visit (HOSPITAL_BASED_OUTPATIENT_CLINIC_OR_DEPARTMENT_OTHER): Payer: Managed Care, Other (non HMO) | Admitting: Lab

## 2011-12-19 ENCOUNTER — Telehealth: Payer: Self-pay | Admitting: Internal Medicine

## 2011-12-19 ENCOUNTER — Ambulatory Visit (HOSPITAL_BASED_OUTPATIENT_CLINIC_OR_DEPARTMENT_OTHER): Payer: Managed Care, Other (non HMO)

## 2011-12-19 ENCOUNTER — Ambulatory Visit (HOSPITAL_BASED_OUTPATIENT_CLINIC_OR_DEPARTMENT_OTHER): Payer: Managed Care, Other (non HMO) | Admitting: Internal Medicine

## 2011-12-19 VITALS — BP 131/85 | HR 97 | Temp 96.9°F | Ht 64.0 in | Wt 156.4 lb

## 2011-12-19 DIAGNOSIS — C7951 Secondary malignant neoplasm of bone: Secondary | ICD-10-CM

## 2011-12-19 DIAGNOSIS — C3430 Malignant neoplasm of lower lobe, unspecified bronchus or lung: Secondary | ICD-10-CM

## 2011-12-19 DIAGNOSIS — C3490 Malignant neoplasm of unspecified part of unspecified bronchus or lung: Secondary | ICD-10-CM

## 2011-12-19 LAB — COMPREHENSIVE METABOLIC PANEL
ALT: 16 U/L (ref 0–35)
AST: 20 U/L (ref 0–37)
Alkaline Phosphatase: 114 U/L (ref 39–117)
CO2: 25 mEq/L (ref 19–32)
Creatinine, Ser: 0.87 mg/dL (ref 0.50–1.10)
Sodium: 136 mEq/L (ref 135–145)
Total Bilirubin: 0.6 mg/dL (ref 0.3–1.2)
Total Protein: 7.1 g/dL (ref 6.0–8.3)

## 2011-12-19 LAB — CBC WITH DIFFERENTIAL/PLATELET
BASO%: 0.4 % (ref 0.0–2.0)
EOS%: 1.9 % (ref 0.0–7.0)
HCT: 37.2 % (ref 34.8–46.6)
LYMPH%: 23.7 % (ref 14.0–49.7)
MCH: 30 pg (ref 25.1–34.0)
MCHC: 33.6 g/dL (ref 31.5–36.0)
MONO#: 0.3 10*3/uL (ref 0.1–0.9)
NEUT%: 65.8 % (ref 38.4–76.8)
Platelets: 209 10*3/uL (ref 145–400)
RBC: 4.17 10*6/uL (ref 3.70–5.45)
WBC: 4 10*3/uL (ref 3.9–10.3)

## 2011-12-19 MED ORDER — DIPHENHYDRAMINE HCL 50 MG/ML IJ SOLN: 50.0000 mg | Freq: Once | INTRAMUSCULAR | Status: DC | PRN

## 2011-12-19 MED ORDER — DENOSUMAB 120 MG/1.7ML ~~LOC~~ SOLN
120.0000 mg | Freq: Once | SUBCUTANEOUS | Status: AC
  Administered 2011-12-19: 120 mg via SUBCUTANEOUS
  Filled 2011-12-19: qty 1.7

## 2011-12-19 NOTE — Telephone Encounter (Signed)
gve the pt her April 2013 appt calendar 

## 2011-12-19 NOTE — Progress Notes (Signed)
[facility] [phone]   [phone]  OFFICE PROGRESS NOTE  [name], MD, MD [address] [facility], [place]. [place] [address]  DIAGNOSIS: Metastatic non-small cell lung cancer adenocarcinoma, diagnosed in December 2010 in a patient with a remote history of smoking and positive EGFR mutation.   PRIOR THERAPY:  1. Status post palliative radiotherapy to the right hilum as well as anterior inferior ribs and lumbar spine from L2-L5 under the care Dr. Shon Baton.  2. status post right hip prophylactic trochanteric femoral nail and under the care of Dr. Eulas Post   CURRENT THERAPY:  1. Tarceva at 150 mg by mouth daily status post 26 months of treatment  2. Xgeva 120 mg subcutaneously given on a monthly basis for bone metastasis status post 5 treatments   INTERVAL HISTORY: Makayla [name] 61 y.o. female returns to the clinic today for followup visit accompanied her son and sister. The patient is feeling fine today except for the pain in the hip area bilaterally. She still on MS Contin and Vicodin for breakthrough pain. The patient is tolerating her treatment with Tarceva fairly well except for grade 1 skin rash. She denied having any diarrhea. She had repeat CT scan of the chest, abdomen and pelvis performed recently and she is here today for evaluation and discussion of her scan results.  MEDICAL HISTORY: Past Medical History  Diagnosis Date  . Lung cancer     lung ca dx 12/10  . COPD (chronic obstructive pulmonary disease)   . Lung cancer 09/01/2011    ALLERGIES:  is allergic to sulfonamide derivatives.  MEDICATIONS:  Current Outpatient Prescriptions  Medication Sig Dispense Refill  . ALPRAZolam (XANAX) 0.5 MG tablet Take 0.5 mg by mouth as needed.        . calcium-vitamin D (OSCAL WITH D) 500-200 MG-UNIT per tablet Take 1 tablet by mouth daily.        . Cholecalciferol (VITAMIN D-3 PO) Take 1,000 Units by mouth 2  (two) times daily.        . clindamycin (CLEOCIN T) 1 % lotion Apply topically 2 (two) times daily.        Marland Kitchen docusate sodium (COLACE) 100 MG capsule Take 100 mg by mouth as needed.        . erlotinib (TARCEVA) 150 MG tablet Take 1 tablet (150 mg total) by mouth daily. Take on an empty stomach 1 hour before meals or 2 hours after.  30 tablet  2  . ferrous sulfate 325 (65 FE) MG tablet Take 325 mg by mouth 2 (two) times daily.        Marland Kitchen HYDROcodone-acetaminophen (VICODIN) 5-500 MG per tablet Take 1 tablet by mouth every 6 (six) hours as needed.  60 tablet  0  . Iron-Vit C-Vit B12-Folic Acid (IRON 100 PLUS PO) Take 65 mg by mouth daily. OTC       . magnesium citrate solution Take 500 mLs by mouth once.      . morphine (MS CONTIN) 30 MG 12 hr tablet Take 1 tablet (30 mg total) by mouth 2 (two) times daily.  60 tablet  0  . methocarbamol (ROBAXIN) 500 MG tablet Take 500 mg by mouth 4 (four) times daily.        . prochlorperazine (COMPAZINE) 10 MG tablet Take 10 mg by mouth every 6 (six) hours as needed.          SURGICAL HISTORY:  Past Surgical  History  Procedure Date  . Abdominal hysterectomy     REVIEW OF SYSTEMS:  A comprehensive review of systems was negative except for: Constitutional: positive for fatigue Respiratory: positive for dyspnea on exertion Musculoskeletal: positive for bone pain   PHYSICAL EXAMINATION: General appearance: alert, cooperative and no distress Neck: no adenopathy Lymph nodes: Cervical, supraclavicular, and axillary nodes normal. Resp: clear to auscultation bilaterally Cardio: regular rate and rhythm, S1, S2 normal, no murmur, click, rub or gallop GI: soft, non-tender; bowel sounds normal; no masses,  no organomegaly Extremities: extremities normal, atraumatic, no cyanosis or edema Neurologic: Alert and oriented X 3, normal strength and tone. Normal symmetric reflexes. Normal coordination and gait  ECOG PERFORMANCE STATUS: 1 - Symptomatic but completely  ambulatory  Blood pressure 131/85, pulse 97, temperature 96.9 F (36.1 C), temperature source Oral, height 5\' 4"  (1.626 m), weight 156 lb 6.4 oz (70.943 kg).  LABORATORY DATA: Lab Results  Component Value Date   WBC 4.0 12/19/2011   HGB 12.5 12/19/2011   HCT 37.2 12/19/2011   MCV 89.3 12/19/2011   PLT 209 12/19/2011      Chemistry      Component Value Date/Time   NA 135 11/21/2011 1421   NA 137 09/21/2011 0902   K 4.1 11/21/2011 1421   K 4.2 09/21/2011 0902   CL 100 11/21/2011 1421   CL 101 09/21/2011 0902   CO2 21 11/21/2011 1421   CO2 28 09/21/2011 0902   BUN 13 11/21/2011 1421   BUN 16 09/21/2011 0902   CREATININE 0.89 11/21/2011 1421   CREATININE 1.0 09/21/2011 0902      Component Value Date/Time   CALCIUM 9.1 11/21/2011 1421   CALCIUM 8.7 09/21/2011 0902   ALKPHOS 128* 11/21/2011 1421   ALKPHOS 130* 09/21/2011 0902   AST 23 11/21/2011 1421   AST 27 09/21/2011 0902   ALT 15 11/21/2011 1421   BILITOT 0.6 11/21/2011 1421   BILITOT 0.60 09/21/2011 0902       RADIOGRAPHIC STUDIES: Ct Chest W Contrast  12/16/2011  *RADIOLOGY REPORT*  Clinical Data:  Lung cancer with bone mets, history of right lung collapse after XRT, tarsi are ongoing  CT CHEST, ABDOMEN AND PELVIS WITH CONTRAST  Technique:  Multidetector CT imaging of the chest, abdomen and pelvis was performed following the standard protocol during bolus administration of intravenous contrast.  Contrast: OMNIPAQUE IOHEXOL 300 MG/ML IJ SOLN  Comparison:  09/21/2011 and 06/03/2011   CT CHEST  Findings:  Stable appearance with complete collapse of the right lung and associated rightward mediastinal shift.  The left lung is clear. No pleural effusion or pneumothorax.  The heart is normal in size.  No pericardial effusion.  Mild atherosclerotic calcifications of the aortic arch.  No suspicious mediastinal, left hilar, or axillary lymphadenopathy.  Widespread osseous metastases, including the C6 vertebral body, left humeral head, T2  posterior elements, right ninth rib at the costochondral junction, and right tenth rib at the costovertebral junction. While the majority is stable, the left humeral head lesion has mildly increased to 2.3 x 2.3 cm, previously 2.1 x 1.8 cm (09/21/2011), and 1.8 x 1.5 cm on 06/03/2011.  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  IMPRESSION: Stable appearance with complete collapse of the right lung and associated rightward mediastinal shift.  Widespread osseous metastases, as described above, including interval progression of a left humeral head lesion.  Otherwise, no evidence of metastatic disease in the  chest.   CT ABDOMEN AND PELVIS  Findings:  Small epiphrenic diverticulum (series 2/image 45).  Stable hypoperfusion/focal heterogeneity involving the medial aspect of the posterior segment right hepatic lobe (series 2/image 43), unchanged from 06/03/2011.  Spleen, pancreas, and adrenal glands are within normal limits.  Gallbladder is unremarkable.  No intrahepatic or extrahepatic ductal dilatation.  Kidneys within normal limits.  No hydronephrosis.  No evidence of bowel obstruction.  Atherosclerotic calcifications of the abdominal aorta and branch vessels.  No abdominopelvic ascites.  No suspicious abdominopelvic lymphadenopathy.  Status post hysterectomy.  No adnexal masses.  Bladder is within normal limits.  Stable osseous metastases, involving L2, L4, L5, sacrum, right pelvis, and bilateral proximal femurs. Status post ORIF of the right proximal femur.  IMPRESSION: Stable osseous metastases in the lumbar spine, pelvis, and bilateral proximal femurs.  Otherwise, no evidence of metastatic disease in the abdomen/pelvis.  Original Report Authenticated By: Charline Bills, M.D.     ASSESSMENT: This is a very pleasant 61 years old white female with metastatic non-small cell lung cancer and positive EGFR mutation. She is currently on treatment with  Tarceva status post 26 cycles. The patient is doing fine and tolerating her treatment fairly well. She has no evidence for disease progression except for the mild increase in the left humeral head lesion. The patient is asymptomatic at this area.  PLAN: I discussed the scan results with the patient her family. I recommended for her to continue treatment was Tarceva at the current dose. For the bone disease the patient will continue treatment with Xgeva. She would come back for followup visit in one month.  She was advised to call me immediately if she has any concerning symptoms in the interval. All questions were answered. The patient knows to call the clinic with any problems, questions or concerns. We can certainly see the patient much sooner if necessary.

## 2011-12-21 ENCOUNTER — Other Ambulatory Visit: Payer: Self-pay | Admitting: Certified Registered Nurse Anesthetist

## 2011-12-21 DIAGNOSIS — C3490 Malignant neoplasm of unspecified part of unspecified bronchus or lung: Secondary | ICD-10-CM

## 2011-12-21 MED ORDER — HYDROCODONE-ACETAMINOPHEN 5-500 MG PO TABS: 1.0000 | ORAL_TABLET | Freq: Four times a day (QID) | ORAL | Status: DC | PRN

## 2011-12-28 ENCOUNTER — Other Ambulatory Visit: Payer: Self-pay | Admitting: Medical Oncology

## 2011-12-28 DIAGNOSIS — R52 Pain, unspecified: Secondary | ICD-10-CM

## 2011-12-28 MED ORDER — MORPHINE SULFATE CR 30 MG PO TB12: 30.0000 mg | ORAL_TABLET | Freq: Two times a day (BID) | ORAL | Status: DC

## 2011-12-28 NOTE — Telephone Encounter (Signed)
rx refilled and left in injection room for pt pick up-pt aware and will pick up in am

## 2012-01-04 ENCOUNTER — Other Ambulatory Visit: Payer: Self-pay | Admitting: *Deleted

## 2012-01-04 DIAGNOSIS — C3490 Malignant neoplasm of unspecified part of unspecified bronchus or lung: Secondary | ICD-10-CM

## 2012-01-04 MED ORDER — HYDROCODONE-ACETAMINOPHEN 5-500 MG PO TABS: 1.0000 | ORAL_TABLET | Freq: Four times a day (QID) | ORAL | Status: DC | PRN

## 2012-01-05 ENCOUNTER — Ambulatory Visit: Payer: Managed Care, Other (non HMO) | Admitting: Gastroenterology

## 2012-01-18 ENCOUNTER — Encounter: Payer: Self-pay | Admitting: Physician Assistant

## 2012-01-18 ENCOUNTER — Other Ambulatory Visit (HOSPITAL_BASED_OUTPATIENT_CLINIC_OR_DEPARTMENT_OTHER): Payer: Managed Care, Other (non HMO) | Admitting: Lab

## 2012-01-18 ENCOUNTER — Telehealth: Payer: Self-pay | Admitting: Internal Medicine

## 2012-01-18 ENCOUNTER — Ambulatory Visit (HOSPITAL_BASED_OUTPATIENT_CLINIC_OR_DEPARTMENT_OTHER): Payer: Managed Care, Other (non HMO)

## 2012-01-18 ENCOUNTER — Ambulatory Visit (HOSPITAL_BASED_OUTPATIENT_CLINIC_OR_DEPARTMENT_OTHER): Payer: Managed Care, Other (non HMO) | Admitting: Physician Assistant

## 2012-01-18 VITALS — BP 107/69 | HR 89 | Temp 97.3°F | Ht 64.0 in | Wt 158.0 lb

## 2012-01-18 DIAGNOSIS — C3490 Malignant neoplasm of unspecified part of unspecified bronchus or lung: Secondary | ICD-10-CM

## 2012-01-18 DIAGNOSIS — C7951 Secondary malignant neoplasm of bone: Secondary | ICD-10-CM

## 2012-01-18 DIAGNOSIS — C7952 Secondary malignant neoplasm of bone marrow: Secondary | ICD-10-CM

## 2012-01-18 LAB — CBC WITH DIFFERENTIAL/PLATELET
BASO%: 0.3 % (ref 0.0–2.0)
LYMPH%: 25.5 % (ref 14.0–49.7)
MCHC: 33.7 g/dL (ref 31.5–36.0)
MCV: 91.4 fL (ref 79.5–101.0)
MONO#: 0.5 10*3/uL (ref 0.1–0.9)
MONO%: 9.3 % (ref 0.0–14.0)
Platelets: 252 10*3/uL (ref 145–400)
RBC: 3.88 10*6/uL (ref 3.70–5.45)
RDW: 12.9 % (ref 11.2–14.5)
WBC: 5.1 10*3/uL (ref 3.9–10.3)
nRBC: 0 % (ref 0–0)

## 2012-01-18 LAB — COMPREHENSIVE METABOLIC PANEL
ALT: 15 U/L (ref 0–35)
AST: 21 U/L (ref 0–37)
Alkaline Phosphatase: 130 U/L — ABNORMAL HIGH (ref 39–117)
Creatinine, Ser: 0.87 mg/dL (ref 0.50–1.10)
Total Bilirubin: 0.3 mg/dL (ref 0.3–1.2)

## 2012-01-18 MED ORDER — DENOSUMAB 120 MG/1.7ML ~~LOC~~ SOLN: 120.0000 mg | Freq: Once | SUBCUTANEOUS | Status: DC

## 2012-01-18 MED ORDER — DENOSUMAB 120 MG/1.7ML ~~LOC~~ SOLN
120.0000 mg | Freq: Once | SUBCUTANEOUS | Status: AC
  Administered 2012-01-18: 120 mg via SUBCUTANEOUS
  Filled 2012-01-18: qty 1.7

## 2012-01-18 MED ORDER — HYDROCODONE-ACETAMINOPHEN 5-500 MG PO TABS: 1.0000 | ORAL_TABLET | Freq: Four times a day (QID) | ORAL | Status: DC | PRN

## 2012-01-18 NOTE — Telephone Encounter (Signed)
appts made and printed for pt,pt was to have mri but has metal,mri was needed prior to [place],but since no scans are now needed i offered to move up her appt with rad/onc and she req to leave it on 01/26/12   aom

## 2012-01-18 NOTE — Patient Instructions (Signed)
Call MD for problems 

## 2012-01-18 NOTE — Progress Notes (Signed)
[facility] [phone]   [phone]  OFFICE PROGRESS NOTE  [name], MD, MD [address] [facility], [place]. [place] [address]  DIAGNOSIS: Metastatic non-small cell lung cancer adenocarcinoma, diagnosed in December 2010 in a patient with a remote history of smoking and positive EGFR mutation.   PRIOR THERAPY:  1. Status post palliative radiotherapy to the right hilum as well as anterior inferior ribs and lumbar spine from L2-L5 under the care Dr. Shon Baton.  2. status post right hip prophylactic trochanteric femoral nail and under the care of Dr. Eulas Post   CURRENT THERAPY:  1. Tarceva at 150 mg by mouth daily status post 27 months of treatment  2. Xgeva 120 mg subcutaneously given on a monthly basis for bone metastasis status post 6 treatments   INTERVAL HISTORY: Makayla [name] 61 y.o. female returns to the clinic today for followup visit accompanied her son and sister. She complains of increasing low back pain with radiation across her hips. This increase in pain has caused her to use more of her breakthrough Vicodin. Overall she continues to tolerate the Tarceva well with only a few skin lesions on her scalp on her face and buttocks. This is well manage with clindamycin topical solution. She denied any problems with diarrhea. She requests a refill for her Vicodin.  MEDICAL HISTORY: Past Medical History  Diagnosis Date  . Lung cancer     lung ca dx 12/10  . COPD (chronic obstructive pulmonary disease)   . Lung cancer 09/01/2011    ALLERGIES:  is allergic to sulfonamide derivatives.  MEDICATIONS:  Current Outpatient Prescriptions  Medication Sig Dispense Refill  . ALPRAZolam (XANAX) 0.5 MG tablet Take 0.5 mg by mouth as needed.        . calcium-vitamin D (OSCAL WITH D) 500-200 MG-UNIT per tablet Take 1 tablet by mouth daily.        . Cholecalciferol (VITAMIN D-3 PO) Take 1,000 Units by mouth 2 (two) times  daily.        . clindamycin (CLEOCIN T) 1 % lotion Apply topically 2 (two) times daily.        Marland Kitchen docusate sodium (COLACE) 100 MG capsule Take 100 mg by mouth as needed.        . erlotinib (TARCEVA) 150 MG tablet Take 1 tablet (150 mg total) by mouth daily. Take on an empty stomach 1 hour before meals or 2 hours after.  30 tablet  2  . ferrous sulfate 325 (65 FE) MG tablet Take 325 mg by mouth 2 (two) times daily.        Marland Kitchen HYDROcodone-acetaminophen (VICODIN) 5-500 MG per tablet Take 1 tablet by mouth every 6 (six) hours as needed.  60 tablet  0  . Iron-Vit C-Vit B12-Folic Acid (IRON 100 PLUS PO) Take 65 mg by mouth daily. OTC       . magnesium citrate solution Take 500 mLs by mouth once.      . methocarbamol (ROBAXIN) 500 MG tablet Take 500 mg by mouth 4 (four) times daily.        Marland Kitchen morphine (MS CONTIN) 30 MG 12 hr tablet Take 1 tablet (30 mg total) by mouth 2 (two) times daily.  60 tablet  0  . prochlorperazine (COMPAZINE) 10 MG tablet Take 10 mg by mouth every 6 (six) hours as needed.        Marland Kitchen DISCONTD: HYDROcodone-acetaminophen (VICODIN) 5-500 MG per tablet Take 1  tablet by mouth every 6 (six) hours as needed.  60 tablet  0  . DISCONTD: warfarin (COUMADIN) 5 MG tablet Take 5 mg by mouth daily.         Current Facility-Administered Medications  Medication Dose Route Frequency Provider Last Rate Last Dose  . denosumab (XGEVA) injection 120 mg  120 mg Subcutaneous Once Conni Slipper, PA   120 mg at 01/18/12 1643   Facility-Administered Medications Ordered in Other Visits  Medication Dose Route Frequency Provider Last Rate Last Dose  . DISCONTD: denosumab (XGEVA) injection 120 mg  120 mg Subcutaneous Once [name], MD        SURGICAL HISTORY:  Past Surgical History  Procedure Date  . Abdominal hysterectomy     REVIEW OF SYSTEMS:  A comprehensive review of systems was negative except for: Constitutional: positive for fatigue Respiratory: positive for dyspnea on  exertion Musculoskeletal: positive for back pain and bone pain   PHYSICAL EXAMINATION: General appearance: alert, cooperative and no distress Neck: no adenopathy Lymph nodes: Cervical, supraclavicular, and axillary nodes normal. Resp: clear to auscultation bilaterally Cardio: regular rate and rhythm, S1, S2 normal, no murmur, click, rub or gallop GI: soft, non-tender; bowel sounds normal; no masses,  no organomegaly Extremities: extremities normal, atraumatic, no cyanosis or edema Neurologic: Alert and oriented X 3, normal strength and tone. Normal symmetric reflexes. Normal coordination and gait Back: Point tenderness over the lumbar spine  ECOG PERFORMANCE STATUS: 1 - Symptomatic but completely ambulatory  Blood pressure 107/69, pulse 89, temperature 97.3 F (36.3 C), temperature source Oral, height 5\' 4"  (1.626 m), weight 158 lb (71.668 kg).  LABORATORY DATA: Lab Results  Component Value Date   WBC 5.1 01/18/2012   HGB 11.9 01/18/2012   HCT 35.4 01/18/2012   MCV 91.4 01/18/2012   PLT 252 01/18/2012      Chemistry      Component Value Date/Time   NA 134* 01/18/2012 1451   NA 137 09/21/2011 0902   K 3.8 01/18/2012 1451   K 4.2 09/21/2011 0902   CL 99 01/18/2012 1451   CL 101 09/21/2011 0902   CO2 24 01/18/2012 1451   CO2 28 09/21/2011 0902   BUN 15 01/18/2012 1451   BUN 16 09/21/2011 0902   CREATININE 0.87 01/18/2012 1451   CREATININE 1.0 09/21/2011 0902      Component Value Date/Time   CALCIUM 9.2 01/18/2012 1451   CALCIUM 8.7 09/21/2011 0902   ALKPHOS 130* 01/18/2012 1451   ALKPHOS 130* 09/21/2011 0902   AST 21 01/18/2012 1451   AST 27 09/21/2011 0902   ALT 15 01/18/2012 1451   BILITOT 0.3 01/18/2012 1451   BILITOT 0.60 09/21/2011 0902       RADIOGRAPHIC STUDIES: Ct Chest W Contrast  12/16/2011  *RADIOLOGY REPORT*  Clinical Data:  Lung cancer with bone mets, history of right lung collapse after XRT, tarsi are ongoing  CT CHEST, ABDOMEN AND PELVIS WITH CONTRAST   Technique:  Multidetector CT imaging of the chest, abdomen and pelvis was performed following the standard protocol during bolus administration of intravenous contrast.  Contrast: OMNIPAQUE IOHEXOL 300 MG/ML IJ SOLN  Comparison:  09/21/2011 and 06/03/2011   CT CHEST  Findings:  Stable appearance with complete collapse of the right lung and associated rightward mediastinal shift.  The left lung is clear. No pleural effusion or pneumothorax.  The heart is normal in size.  No pericardial effusion.  Mild atherosclerotic calcifications of the aortic arch.  No suspicious  mediastinal, left hilar, or axillary lymphadenopathy.  Widespread osseous metastases, including the C6 vertebral body, left humeral head, T2 posterior elements, right ninth rib at the costochondral junction, and right tenth rib at the costovertebral junction. While the majority is stable, the left humeral head lesion has mildly increased to 2.3 x 2.3 cm, previously 2.1 x 1.8 cm (09/21/2011), and 1.8 x 1.5 cm on 06/03/2011.  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  IMPRESSION: Stable appearance with complete collapse of the right lung and associated rightward mediastinal shift.  Widespread osseous metastases, as described above, including interval progression of a left humeral head lesion.  Otherwise, no evidence of metastatic disease in the chest.   CT ABDOMEN AND PELVIS  Findings:  Small epiphrenic diverticulum (series 2/image 45).  Stable hypoperfusion/focal heterogeneity involving the medial aspect of the posterior segment right hepatic lobe (series 2/image 43), unchanged from 06/03/2011.  Spleen, pancreas, and adrenal glands are within normal limits.  Gallbladder is unremarkable.  No intrahepatic or extrahepatic ductal dilatation.  Kidneys within normal limits.  No hydronephrosis.  No evidence of bowel obstruction.  Atherosclerotic calcifications of the abdominal aorta and branch  vessels.  No abdominopelvic ascites.  No suspicious abdominopelvic lymphadenopathy.  Status post hysterectomy.  No adnexal masses.  Bladder is within normal limits.  Stable osseous metastases, involving L2, L4, L5, sacrum, right pelvis, and bilateral proximal femurs. Status post ORIF of the right proximal femur.  IMPRESSION: Stable osseous metastases in the lumbar spine, pelvis, and bilateral proximal femurs.  Otherwise, no evidence of metastatic disease in the abdomen/pelvis.  Original Report Authenticated By: Charline Bills, M.D.     ASSESSMENT: This is a very pleasant 61 years old white female with metastatic non-small cell lung cancer and positive EGFR mutation. She is currently on treatment with Tarceva status post 27 cycles. The patient is doing fine and tolerating her treatment fairly well. She has no evidence for disease progression except for the mild increase in the left humeral head lesion. The patient is asymptomatic at this area. The patient was discussed with [name]. Given her increased low back pain with known metastatic lesions at L2-4 and 5 as well as the sacrum and right pelvis and bilateral proximal femur we will refer her back to radiation oncology for reevaluation of palliative radiotherapy for pain relief. The patient has a rod in pain in her right femur and therefore is unable to have MRI evaluations. She will continue on her monthly Xgeva injections and will receive her injection today for her bone metastasis. Her Vicodin 5/500 was refilled for a total of  60 tablets with no refill. She will continue on her MS Contin as previously scheduled. She will return in one month with a repeat CBC differential and C. met for a symptom management visit as well as her monthly Xgeva injection  Makayla Colello E, PA-C   All questions were answered. The patient knows to call the clinic with any problems, questions or concerns. We can certainly see the patient much sooner if  necessary.

## 2012-01-23 ENCOUNTER — Other Ambulatory Visit: Payer: Self-pay | Admitting: Medical Oncology

## 2012-01-23 DIAGNOSIS — R52 Pain, unspecified: Secondary | ICD-10-CM

## 2012-01-23 MED ORDER — MORPHINE SULFATE CR 30 MG PO TB12: 30.0000 mg | ORAL_TABLET | Freq: Two times a day (BID) | ORAL | Status: AC

## 2012-01-23 NOTE — Telephone Encounter (Signed)
requests refill -will pick up in am

## 2012-01-26 ENCOUNTER — Ambulatory Visit
Admission: RE | Admit: 2012-01-26 | Discharge: 2012-01-26 | Disposition: A | Discharge status: Home / Self Care | Payer: Managed Care, Other (non HMO) | Source: Ambulatory Visit | Attending: Radiation Oncology | Admitting: Radiation Oncology

## 2012-01-26 ENCOUNTER — Ambulatory Visit: Payer: Managed Care, Other (non HMO)

## 2012-01-26 ENCOUNTER — Encounter: Payer: Self-pay | Admitting: Radiation Oncology

## 2012-01-26 ENCOUNTER — Ambulatory Visit: Payer: Managed Care, Other (non HMO) | Admitting: Radiation Oncology

## 2012-01-26 VITALS — BP 124/78 | HR 89 | Temp 97.0°F | Resp 18 | Wt 158.2 lb

## 2012-01-26 DIAGNOSIS — C3490 Malignant neoplasm of unspecified part of unspecified bronchus or lung: Secondary | ICD-10-CM | POA: Insufficient documentation

## 2012-01-26 DIAGNOSIS — C7952 Secondary malignant neoplasm of bone marrow: Secondary | ICD-10-CM

## 2012-01-26 DIAGNOSIS — Z510 Encounter for antineoplastic radiation therapy: Secondary | ICD-10-CM | POA: Insufficient documentation

## 2012-01-26 DIAGNOSIS — M549 Dorsalgia, unspecified: Secondary | ICD-10-CM | POA: Insufficient documentation

## 2012-01-26 DIAGNOSIS — C7951 Secondary malignant neoplasm of bone: Secondary | ICD-10-CM

## 2012-01-26 NOTE — Progress Notes (Signed)
Radiation Oncology         [phone] ________________________________  Name: Makayla [name] MRN: [id number]  Date: 01/26/2012  DOB: 1950/10/24  Follow-Up Visit Note  CC: Makayla Pounds, MD, MD  Makayla Gaul, MD  Diagnosis:   Metastatic lung cancer  Previous Radiation:  30 Gy the L3-L5 spine, right rib and right hilum completed January 2011  Narrative:  The patient returns today for follow-up.  She has been followed by [name] been maintained on Tarceva and Makayla [name].  She has been having worsening back pain. She is requiring more Vicodin and taking it at a more frequent basis. She reports this pain is worse when she is lying down for long periods of time. It is better when she is up and moving around or when she is sitting. She has been using heating pad which have helped as well. At times the pain radiates down her left leg. She states the pain medications make her feel loopy and decrease her appetite area and she would like to come off of them and if at all possible. Also noticed that she has decreased exercise tolerance I would like to begin working on that again.  ALLERGIES:  is allergic to sulfonamide derivatives.  Meds: Current Outpatient Prescriptions  Medication Sig Dispense Refill  . denosumab (XGEVA) 120 MG/1.7ML SOLN Inject 120 mg into the skin once.      . ALPRAZolam (XANAX) 0.5 MG tablet Take 0.5 mg by mouth as needed.        . calcium-vitamin D (OSCAL WITH D) 500-200 MG-UNIT per tablet Take 1 tablet by mouth daily.        . Cholecalciferol (VITAMIN D-3 PO) Take 1,000 Units by mouth 2 (two) times daily.        . clindamycin (CLEOCIN T) 1 % lotion Apply topically 2 (two) times daily.        Marland Kitchen docusate sodium (COLACE) 100 MG capsule Take 100 mg by mouth as needed.        . erlotinib (TARCEVA) 150 MG tablet Take 1 tablet (150 mg total) by mouth daily. Take on an empty stomach 1 hour before meals or 2 hours after.  30 tablet  2  . ferrous sulfate 325 (65 FE) MG tablet Take  325 mg by mouth 2 (two) times daily.        Marland Kitchen HYDROcodone-acetaminophen (VICODIN) 5-500 MG per tablet Take 1 tablet by mouth every 6 (six) hours as needed.  60 tablet  0  . Iron-Vit C-Vit B12-Folic Acid (IRON 100 PLUS PO) Take 65 mg by mouth daily. OTC       . magnesium citrate solution Take 500 mLs by mouth once.      . methocarbamol (ROBAXIN) 500 MG tablet Take 500 mg by mouth 4 (four) times daily.        Marland Kitchen morphine (MS CONTIN) 30 MG 12 hr tablet Take 1 tablet (30 mg total) by mouth 2 (two) times daily.  60 tablet  0  . prochlorperazine (COMPAZINE) 10 MG tablet Take 10 mg by mouth every 6 (six) hours as needed.        Marland Kitchen DISCONTD: warfarin (COUMADIN) 5 MG tablet Take 5 mg by mouth daily.          Physical Findings: The patient is in no acute distress. Patient is alert and oriented. . she is 5 out of 5 strength bilaterally. She has tenderness to palpation in her lower lumbar spine.  weight is 158 lb 3.2  oz (71.759 kg). Her oral temperature is 97 F (36.1 C). Her blood pressure is 124/78 and her pulse is 89. Her respiration is 18. .  No significant changes.  Lab Findings: Lab Results  Component Value Date   WBC 5.1 01/18/2012   HGB 11.9 01/18/2012   HCT 35.4 01/18/2012   MCV 91.4 01/18/2012   PLT 252 01/18/2012      Radiographic Findings: No results found.  Impression:   Metastatic non-small cell lung cancer with previous palliative radiation to the lumbar spine and recurrent pain  Plan:  I talked to [name] and her family at length. I think we can retreat this area. We are below her cauda equina. If we can stay safely offer for bowel I think he would be reasonable to give her another 20-25 gray. Using this we may be able to provide her with some increased pain control. I told her the side effects of treatment could be permanent damage to the bowel as well as some nausea or diarrhea. She is undergoing consent and agree to proceed forward. Your able to schedule her for simulation later  today.  _____________________________________

## 2012-01-26 NOTE — Progress Notes (Signed)
HERE TODAY FOR PAIN IN LOWER BACK AND PAIN DOWN LEFT LEG AT TIMES.  RADIATION FOR LUNG CA IN JAN. 2011.  HAD SURGERY ON RIGHT HIP AND LEG WITH PIN AND ROD PLACEMENT ON OCT 15 , 2012.  HAS NO C/O PAIN NOW , TAKES MS CONTIN 30MG  Q 12 HRS AND VICODIN PRN FOR BTP.  Marland Kitchen

## 2012-01-26 NOTE — Progress Notes (Signed)
[facility] Radiation Oncology Simulation and Treatment Planning Note   Name: Makayla [name] MRN: [id number]  Date: 01/26/2012  DOB: [birth year]  Status: outpatient    DIAGNOSIS: Metastatic lung cancer to spine (retreat)    CONSENT VERIFIED: yes   SET UP AND IMMOBILIZATION: Patient is setup supine with arms in a wing board and legs in an alpha cradle.    NARRATIVE: The patient was brought to the CT Simulation planning suite.  Identity was confirmed.  All relevant records and images related to the planned course of therapy were reviewed.  Then, the patient was positioned in a stable reproducible clinical set-up for radiation therapy.  CT images were obtained.  Skin markings were placed.  Her original tattoos were used for set up purposes. The CT images were loaded into the planning software where the target and avoidance structures were contoured.  The radiation prescription was entered and confirmed.   TREATMENT PLANNING NOTE:  Treatment planning then occurred. I have requested 3D simulation with [facility] [place] of the spinal cord, total lungs and gross tumor volume. I have also requested mlcs and an isodose plan.   Special treatment procedure will be performed as Makayla [name] has been previously treated to this area.

## 2012-01-30 ENCOUNTER — Other Ambulatory Visit: Payer: Self-pay

## 2012-01-30 DIAGNOSIS — C3490 Malignant neoplasm of unspecified part of unspecified bronchus or lung: Secondary | ICD-10-CM

## 2012-01-30 MED ORDER — HYDROCODONE-ACETAMINOPHEN 5-500 MG PO TABS: ORAL_TABLET | ORAL | Status: DC

## 2012-02-02 ENCOUNTER — Ambulatory Visit
Admission: RE | Admit: 2012-02-02 | Discharge: 2012-02-02 | Disposition: A | Discharge status: Home / Self Care | Payer: Managed Care, Other (non HMO) | Source: Ambulatory Visit | Attending: Radiation Oncology | Admitting: Radiation Oncology

## 2012-02-02 DIAGNOSIS — C3490 Malignant neoplasm of unspecified part of unspecified bronchus or lung: Secondary | ICD-10-CM

## 2012-02-02 NOTE — Progress Notes (Signed)
[facility] Radiation Oncology Simulation Verification Note   Name: Makayla [name] MRN: [id number]   Date: 02/02/2012  DOB: [birth year]  Status:outpatient   DIAGNOSIS:  1. Lung cancer     POSITION: Patient was placed in the supine position on the treatment machine.  Isocenter and MLCS were reviewed and treatment was approved. I approved the kv/kv image pair for treatment.   NARRATIVE: Patient tolerated simulation well.

## 2012-02-06 ENCOUNTER — Ambulatory Visit
Admission: RE | Admit: 2012-02-06 | Discharge: 2012-02-06 | Disposition: A | Discharge status: Home / Self Care | Payer: Managed Care, Other (non HMO) | Source: Ambulatory Visit | Attending: Radiation Oncology | Admitting: Radiation Oncology

## 2012-02-07 ENCOUNTER — Ambulatory Visit
Admission: RE | Admit: 2012-02-07 | Discharge: 2012-02-07 | Disposition: A | Discharge status: Home / Self Care | Payer: Managed Care, Other (non HMO) | Source: Ambulatory Visit | Attending: Radiation Oncology | Admitting: Radiation Oncology

## 2012-02-07 ENCOUNTER — Encounter: Payer: Self-pay | Admitting: Radiation Oncology

## 2012-02-07 ENCOUNTER — Other Ambulatory Visit: Payer: Self-pay | Admitting: Medical Oncology

## 2012-02-07 ENCOUNTER — Ambulatory Visit: Admission: RE | Admit: 2012-02-07 | Payer: Managed Care, Other (non HMO) | Source: Ambulatory Visit

## 2012-02-07 VITALS — BP 132/84 | HR 88 | Resp 18 | Wt 158.7 lb

## 2012-02-07 DIAGNOSIS — C7951 Secondary malignant neoplasm of bone: Secondary | ICD-10-CM

## 2012-02-07 NOTE — Progress Notes (Signed)
Patient presents to the clinic today accompanied by a family member for an under treat visit with Dr. Michell Heinrich. Patient is alert and oriented to person, place, and time. No distress noted. Steady gait noted. Pleasant affect noted. Patient reports intermittent low back pain 5 on a scale of 0-10. Patient reports taking xanax and vicodin prior to coming for each radiation treatment and "laying on the hard treatment table." Patient denies hyperpigmentation or itching dryness of the treatment area. Patient denies nausea, vomiting, headache, dizziness or diarrhea. Patient's weight stable. Reported all findings to Dr. Michell Heinrich.

## 2012-02-07 NOTE — Progress Notes (Signed)
Weekly Management Note Current Dose:  5 Gy  Projected Dose: 25 Gy   Narrative:  The patient presents for routine under treatment assessment.  CBCT/MVCT images/Port film x-rays were reviewed.  The chart was checked. Pain well controled with hydrocodone bid-tid. Took some before treatment and was able to tolerate well. No nausea.   Physical Findings: Weight: 158 lb 11.2 oz (71.986 kg). Unchanged  Impression:  The patient is tolerating radiation.  Plan:  Continue treatment as planned. We discussed overlap of fields and necessity of 4 fields to avoid previously treated bowel. Continue vicodin and xanax prn.

## 2012-02-08 ENCOUNTER — Ambulatory Visit
Admission: RE | Admit: 2012-02-08 | Discharge: 2012-02-08 | Disposition: A | Discharge status: Home / Self Care | Payer: Managed Care, Other (non HMO) | Source: Ambulatory Visit | Attending: Radiation Oncology | Admitting: Radiation Oncology

## 2012-02-09 ENCOUNTER — Encounter: Payer: Self-pay | Admitting: Radiation Oncology

## 2012-02-09 ENCOUNTER — Ambulatory Visit
Admission: RE | Admit: 2012-02-09 | Discharge: 2012-02-09 | Disposition: A | Discharge status: Home / Self Care | Payer: Managed Care, Other (non HMO) | Source: Ambulatory Visit | Attending: Radiation Oncology | Admitting: Radiation Oncology

## 2012-02-09 ENCOUNTER — Other Ambulatory Visit: Payer: Self-pay | Admitting: *Deleted

## 2012-02-09 DIAGNOSIS — C3490 Malignant neoplasm of unspecified part of unspecified bronchus or lung: Secondary | ICD-10-CM

## 2012-02-09 MED ORDER — HYDROCODONE-ACETAMINOPHEN 5-500 MG PO TABS: ORAL_TABLET | ORAL | Status: DC

## 2012-02-09 NOTE — Progress Notes (Signed)
Pt seen in nursing today for symptoms of nausea.  Is taking pain medications regularly and has not had a bowel movement since last week when she had a slight amount of diarrhea.  No fever, chills or diarrhea since then. Gave instructions on milk of magnesia and mag citrate.

## 2012-02-09 NOTE — Progress Notes (Signed)
HERE TODAY WITH C/O NAUSEA .  PATIENT IS TAKING VICODIN AND MORPHINE 30MG  BID.  ALSO TAKES XANAX.  HAS BEEN CONSTIPATED THIS WEEK BUT HAD DIARRHEA LAST WEEK.  HAS NO ANTIEMETIC AT HOME.     VS.....T..98.......BP.109/75.....P..91.........R18.  HAD LUNCG AND BREAKFAST TODAY AND HAS NOT BEN VERY NAUSEATED,   TOOK VICODIN AT ONE AND XANAX AT TWO AND MORPHINE AT 0900.

## 2012-02-09 NOTE — Telephone Encounter (Signed)
MD approved refill--will discuss pain at next visit on 5/15

## 2012-02-10 ENCOUNTER — Ambulatory Visit
Admission: RE | Admit: 2012-02-10 | Discharge: 2012-02-10 | Disposition: A | Discharge status: Home / Self Care | Payer: Managed Care, Other (non HMO) | Source: Ambulatory Visit | Attending: Radiation Oncology | Admitting: Radiation Oncology

## 2012-02-10 ENCOUNTER — Encounter: Payer: Self-pay | Admitting: Radiation Oncology

## 2012-02-10 DIAGNOSIS — C3490 Malignant neoplasm of unspecified part of unspecified bronchus or lung: Secondary | ICD-10-CM

## 2012-02-10 MED ORDER — PROCHLORPERAZINE MALEATE 10 MG PO TABS: 10.0000 mg | ORAL_TABLET | Freq: Four times a day (QID) | ORAL | Status: AC | PRN

## 2012-02-10 NOTE — Progress Notes (Signed)
   Weekly Management Note Current Dose:   1250cGy  Projected Dose:  2500cGy   Narrative:  The patient presents for routine under treatment assessment.  CBCT/MVCT images/Port film x-rays were reviewed.  The chart was checked. She continues to have nausea despite having a good bowel movement yesterday. She has been vomiting. She does not have any antiemetics. She is receiving radiotherapy to her lumbosacral spine.  Physical Findings: Weight: 155 lb 6.4 oz (70.489 kg). Her weight is 3 pounds less than 3 days ago.   Blood Pressure is 108/80 with a pulse of 88.She is in no acute distress sitting comfortably in a chair; nontoxic appearing.  Impression:  The patient is tolerating radiotherapy with some nausea and vomiting, and etiology is not entirely clear to me but it could be a side effect from her lumbosacral radiotherapy.  Plan:  Continue radiotherapy as planned. She said that Compazine has worked well for her in the past. I've given her prescription for Compazine and instructed her on bland foods and liquids try over the weekend.

## 2012-02-10 NOTE — Progress Notes (Signed)
Patient presents to the clinic today requesting to be seen prior to the weekend for symptom of nausea and vomiting. Patient accompanied by her son today. Patient alert and oriented to person, place, and time. No distress noted. Steady gait noted. Pleasant affect noted. Patient denies pain at this time. Patient reports that she has been constipated and without a bowel movement for four days until yesterday. Patient reports she took 60 cc of MOM as directed by Dr. Michell Heinrich and "it cleaned her out." Despite the large bowel movement patient reports waking up with continued nausea. Patient reports vomiting up her breakfast but keeping down her lunch. Patient does not have a script for an antiemetic but, reports compazine worked in the past. Reported all findings to Dr. Basilio Cairo.

## 2012-02-13 ENCOUNTER — Ambulatory Visit
Admission: RE | Admit: 2012-02-13 | Discharge: 2012-02-13 | Disposition: A | Discharge status: Home / Self Care | Payer: Managed Care, Other (non HMO) | Source: Ambulatory Visit | Attending: Radiation Oncology | Admitting: Radiation Oncology

## 2012-02-14 ENCOUNTER — Ambulatory Visit
Admission: RE | Admit: 2012-02-14 | Discharge: 2012-02-14 | Disposition: A | Discharge status: Home / Self Care | Payer: Managed Care, Other (non HMO) | Source: Ambulatory Visit | Attending: Radiation Oncology | Admitting: Radiation Oncology

## 2012-02-14 ENCOUNTER — Encounter: Payer: Self-pay | Admitting: Radiation Oncology

## 2012-02-14 ENCOUNTER — Other Ambulatory Visit: Payer: Self-pay | Admitting: *Deleted

## 2012-02-14 VITALS — BP 115/86 | HR 88 | Resp 18 | Wt 152.6 lb

## 2012-02-14 DIAGNOSIS — C3490 Malignant neoplasm of unspecified part of unspecified bronchus or lung: Secondary | ICD-10-CM

## 2012-02-14 NOTE — Telephone Encounter (Signed)
THIS REQUEST WAS GIVEN TO DR.MOHAMED'S NURSE, STEPHANIE JOHNSON,RN. 

## 2012-02-14 NOTE — Progress Notes (Signed)
Patient presents to the clinic today accompanied by her son for an under treat visit with Dr. Michell Heinrich. Patient is alert and oriented to person, place, and time. No distress noted. Steady gait noted. Pleasant affect noted. Patient denies pain at this time. Patient denies skin changes to back/treatment area. Patient reports she used compazine for four days for nausea but, stopped on Sunday because the nausea subsided. Patient reports that compazine caused her to have blurred vision. Patient reports that she has had no nausea or vomiting since Sunday. Patient reports having a bowel movement this morning. Reported all findings to Dr. Michell Heinrich.

## 2012-02-14 NOTE — Progress Notes (Signed)
Weekly Management Note Current Dose:  17.5 Gy  Projected Dose: 25 Gy   Narrative:  The patient presents for routine under treatment assessment.  CBCT/MVCT images/Port film x-rays were reviewed.  The chart was checked. Nausea improved. Some blurry vision with compazine ATC. Constipation has resolved and now some diarrhea.  Pain improved.   Physical Findings: Weight: 152 lb 9.6 oz (69.219 kg). Unchanged  Impression:  The patient is tolerating radiation.  Plan:  Continue treatment as planned. Follow up in 1 month. D/c stool softeners for now.

## 2012-02-15 ENCOUNTER — Telehealth: Payer: Self-pay | Admitting: Internal Medicine

## 2012-02-15 ENCOUNTER — Ambulatory Visit
Admission: RE | Admit: 2012-02-15 | Discharge: 2012-02-15 | Disposition: A | Discharge status: Home / Self Care | Payer: Managed Care, Other (non HMO) | Source: Ambulatory Visit | Attending: Radiation Oncology | Admitting: Radiation Oncology

## 2012-02-15 ENCOUNTER — Ambulatory Visit (HOSPITAL_BASED_OUTPATIENT_CLINIC_OR_DEPARTMENT_OTHER): Payer: Managed Care, Other (non HMO) | Admitting: Physician Assistant

## 2012-02-15 ENCOUNTER — Other Ambulatory Visit: Payer: Managed Care, Other (non HMO) | Admitting: Lab

## 2012-02-15 ENCOUNTER — Other Ambulatory Visit: Payer: Self-pay | Admitting: *Deleted

## 2012-02-15 ENCOUNTER — Ambulatory Visit (HOSPITAL_BASED_OUTPATIENT_CLINIC_OR_DEPARTMENT_OTHER): Payer: Managed Care, Other (non HMO)

## 2012-02-15 VITALS — BP 106/73 | HR 96 | Temp 97.8°F | Ht 64.0 in | Wt 151.9 lb

## 2012-02-15 DIAGNOSIS — C3430 Malignant neoplasm of lower lobe, unspecified bronchus or lung: Secondary | ICD-10-CM

## 2012-02-15 DIAGNOSIS — C7952 Secondary malignant neoplasm of bone marrow: Secondary | ICD-10-CM

## 2012-02-15 DIAGNOSIS — C3490 Malignant neoplasm of unspecified part of unspecified bronchus or lung: Secondary | ICD-10-CM

## 2012-02-15 DIAGNOSIS — R5383 Other fatigue: Secondary | ICD-10-CM

## 2012-02-15 DIAGNOSIS — R0602 Shortness of breath: Secondary | ICD-10-CM

## 2012-02-15 DIAGNOSIS — C7951 Secondary malignant neoplasm of bone: Secondary | ICD-10-CM

## 2012-02-15 DIAGNOSIS — R5381 Other malaise: Secondary | ICD-10-CM

## 2012-02-15 LAB — COMPREHENSIVE METABOLIC PANEL
AST: 18 U/L (ref 0–37)
Albumin: 4 g/dL (ref 3.5–5.2)
Alkaline Phosphatase: 95 U/L (ref 39–117)
Calcium: 9.2 mg/dL (ref 8.4–10.5)
Chloride: 102 mEq/L (ref 96–112)
Glucose, Bld: 111 mg/dL — ABNORMAL HIGH (ref 70–99)
Potassium: 4 mEq/L (ref 3.5–5.3)
Sodium: 135 mEq/L (ref 135–145)
Total Protein: 6.4 g/dL (ref 6.0–8.3)

## 2012-02-15 LAB — CBC WITH DIFFERENTIAL/PLATELET
Basophils Absolute: 0 10*3/uL (ref 0.0–0.1)
EOS%: 2.6 % (ref 0.0–7.0)
HCT: 37.2 % (ref 34.8–46.6)
HGB: 12.6 g/dL (ref 11.6–15.9)
MCH: 30.7 pg (ref 25.1–34.0)
MONO#: 0.4 10*3/uL (ref 0.1–0.9)
NEUT#: 2.6 10*3/uL (ref 1.5–6.5)
NEUT%: 73.4 % (ref 38.4–76.8)
RDW: 12.8 % (ref 11.2–14.5)
WBC: 3.6 10*3/uL — ABNORMAL LOW (ref 3.9–10.3)
lymph#: 0.5 10*3/uL — ABNORMAL LOW (ref 0.9–3.3)

## 2012-02-15 MED ORDER — ERLOTINIB HCL 150 MG PO TABS: 150.0000 mg | ORAL_TABLET | Freq: Every day | ORAL | Status: DC

## 2012-02-15 MED ORDER — DENOSUMAB 120 MG/1.7ML ~~LOC~~ SOLN
120.0000 mg | Freq: Once | SUBCUTANEOUS | Status: AC
  Administered 2012-02-15: 120 mg via SUBCUTANEOUS
  Filled 2012-02-15: qty 1.7

## 2012-02-15 NOTE — Telephone Encounter (Signed)
gv pt appt schedule for June including ct for 6/7.

## 2012-02-16 ENCOUNTER — Ambulatory Visit
Admission: RE | Admit: 2012-02-16 | Discharge: 2012-02-16 | Disposition: A | Discharge status: Home / Self Care | Payer: Managed Care, Other (non HMO) | Source: Ambulatory Visit | Attending: Radiation Oncology | Admitting: Radiation Oncology

## 2012-02-17 ENCOUNTER — Encounter: Payer: Self-pay | Admitting: Radiation Oncology

## 2012-02-17 ENCOUNTER — Ambulatory Visit
Admission: RE | Admit: 2012-02-17 | Discharge: 2012-02-17 | Disposition: A | Discharge status: Home / Self Care | Payer: Managed Care, Other (non HMO) | Source: Ambulatory Visit | Attending: Radiation Oncology | Admitting: Radiation Oncology

## 2012-02-17 NOTE — Progress Notes (Signed)
[facility] [phone]   [phone]  OFFICE PROGRESS NOTE  [name], MD, MD [address] [facility], [place]. [place] [address]  DIAGNOSIS: Metastatic non-small cell lung cancer adenocarcinoma, diagnosed in December 2010 in a patient with a remote history of smoking and positive EGFR mutation.   PRIOR THERAPY:  1. Status post palliative radiotherapy to the right hilum as well as anterior inferior ribs and lumbar spine from L2-L5 under the care Dr. Shon Baton.  2. status post right hip prophylactic trochanteric femoral nail and under the care of Dr. Eulas Post   CURRENT THERAPY:  1. Tarceva at 150 mg by mouth daily status post 28 months of treatment  2. Xgeva 120 mg subcutaneously given on a monthly basis for bone metastasis status post 7 treatments  3. palliative radiotherapy to his lumbar spine under the care of Dr.Wentworth     INTERVAL HISTORY: Makayla [name] 61 y.o. female returns to the clinic today for followup visit accompanied her son and sister. She was having increased low back pain and had escalated the use of her Vicodin to control the pain. We referred her back to Dr. Michell Heinrich and she is now undergoing palliative radiotherapy to her lumbar spine. Since receiving radiation therapy to this area her pain is significantly better controlled and she is now having significantly less pain. She is now able to lay flat and her gait has normalized. Request a refill for her MS Contin 30 mg tablets that she takes one every 12 hours. She did initially have some "radiation sickness" with nausea and vomiting as well as some constipation. The symptoms have resolved the under the care Dr. Michell Heinrich.  Overall she continues to tolerate the Tarceva well with only a few skin lesions on her scalp on her face and buttocks. This is well manage with clindamycin topical solution. She denied any problems with diarrhea. She continues to have  some shortness of breath with exertion and fatigue.  MEDICAL HISTORY: Past Medical History  Diagnosis Date  . Lung cancer     lung ca dx 12/10  . COPD (chronic obstructive pulmonary disease)   . Lung cancer 09/01/2011    ALLERGIES:  is allergic to sulfonamide derivatives.  MEDICATIONS:  Current Outpatient Prescriptions  Medication Sig Dispense Refill  . ALPRAZolam (XANAX) 0.5 MG tablet Take 0.5 mg by mouth as needed.        . calcium-vitamin D (OSCAL WITH D) 500-200 MG-UNIT per tablet Take 1 tablet by mouth daily.        . Cholecalciferol (VITAMIN D-3 PO) Take 1,000 Units by mouth 2 (two) times daily.        . clindamycin (CLEOCIN T) 1 % lotion Apply topically 2 (two) times daily.        Marland Kitchen denosumab (XGEVA) 120 MG/1.7ML SOLN Inject 120 mg into the skin once.      . docusate sodium (COLACE) 100 MG capsule Take 100 mg by mouth as needed.        . erlotinib (TARCEVA) 150 MG tablet Take 1 tablet (150 mg total) by mouth daily. Take on an empty stomach 1 hour before meals or 2 hours after.  30 tablet  1  . ferrous sulfate 325 (65 FE) MG tablet Take 325 mg by mouth 2 (two) times daily.        Marland Kitchen HYDROcodone-acetaminophen (VICODIN) 5-500 MG per tablet Take 1 tablet by mouth every 4-6 hrs prn breakthrough pain  60 tablet  0  . Iron-Vit C-Vit B12-Folic Acid (IRON 100 PLUS PO) Take 65 mg by mouth daily. OTC       . magnesium citrate solution Take 500 mLs by mouth once.      . methocarbamol (ROBAXIN) 500 MG tablet Take 500 mg by mouth 4 (four) times daily.        Marland Kitchen morphine (MS CONTIN) 30 MG 12 hr tablet Take 1 tablet (30 mg total) by mouth 2 (two) times daily.  60 tablet  0  . prochlorperazine (COMPAZINE) 10 MG tablet Take 1 tablet (10 mg total) by mouth every 6 (six) hours as needed.  60 tablet  0  . DISCONTD: warfarin (COUMADIN) 5 MG tablet Take 5 mg by mouth daily.          SURGICAL HISTORY:  Past Surgical History  Procedure Date  . Abdominal hysterectomy   . Surgery right leg, rod and  pin     REVIEW OF SYSTEMS:  A comprehensive review of systems was negative except for: Constitutional: positive for fatigue Respiratory: positive for dyspnea on exertion Musculoskeletal: positive for back pain and bone pain   PHYSICAL EXAMINATION: General appearance: alert, cooperative and no distress Neck: no adenopathy Lymph nodes: Cervical, supraclavicular, and axillary nodes normal. Resp: clear to auscultation bilaterally Cardio: regular rate and rhythm, S1, S2 normal, no murmur, click, rub or gallop GI: soft, non-tender; bowel sounds normal; no masses,  no organomegaly Extremities: extremities normal, atraumatic, no cyanosis or edema Neurologic: Alert and oriented X 3, normal strength and tone. Normal symmetric reflexes. Normal coordination and gait Back: Decreased point tenderness over the lumbar spine  ECOG PERFORMANCE STATUS: 1 - Symptomatic but completely ambulatory  Blood pressure 106/73, pulse 96, temperature 97.8 F (36.6 C), temperature source Oral, height 5\' 4"  (1.626 m), weight 151 lb 14.4 oz (68.901 kg).  LABORATORY DATA: Lab Results  Component Value Date   WBC 3.6* 02/15/2012   HGB 12.6 02/15/2012   HCT 37.2 02/15/2012   MCV 90.8 02/15/2012   PLT 225 02/15/2012      Chemistry      Component Value Date/Time   NA 135 02/15/2012 1424   NA 137 09/21/2011 0902   K 4.0 02/15/2012 1424   K 4.2 09/21/2011 0902   CL 102 02/15/2012 1424   CL 101 09/21/2011 0902   CO2 25 02/15/2012 1424   CO2 28 09/21/2011 0902   BUN 12 02/15/2012 1424   BUN 16 09/21/2011 0902   CREATININE 0.80 02/15/2012 1424   CREATININE 1.0 09/21/2011 0902      Component Value Date/Time   CALCIUM 9.2 02/15/2012 1424   CALCIUM 8.7 09/21/2011 0902   ALKPHOS 95 02/15/2012 1424   ALKPHOS 130* 09/21/2011 0902   AST 18 02/15/2012 1424   AST 27 09/21/2011 0902   ALT 11 02/15/2012 1424   BILITOT 0.4 02/15/2012 1424   BILITOT 0.60 09/21/2011 0902       RADIOGRAPHIC STUDIES: Ct Chest W  Contrast  12/16/2011  *RADIOLOGY REPORT*  Clinical Data:  Lung cancer with bone mets, history of right lung collapse after XRT, tarsi are ongoing  CT CHEST, ABDOMEN AND PELVIS WITH CONTRAST  Technique:  Multidetector CT imaging of the chest, abdomen and pelvis was performed following the standard protocol during bolus administration of intravenous contrast.  Contrast: OMNIPAQUE IOHEXOL 300 MG/ML IJ SOLN  Comparison:  09/21/2011 and 06/03/2011   CT CHEST  Findings:  Stable appearance with complete collapse of the right lung  and associated rightward mediastinal shift.  The left lung is clear. No pleural effusion or pneumothorax.  The heart is normal in size.  No pericardial effusion.  Mild atherosclerotic calcifications of the aortic arch.  No suspicious mediastinal, left hilar, or axillary lymphadenopathy.  Widespread osseous metastases, including the C6 vertebral body, left humeral head, T2 posterior elements, right ninth rib at the costochondral junction, and right tenth rib at the costovertebral junction. While the majority is stable, the left humeral head lesion has mildly increased to 2.3 x 2.3 cm, previously 2.1 x 1.8 cm (09/21/2011), and 1.8 x 1.5 cm on 06/03/2011.  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  IMPRESSION: Stable appearance with complete collapse of the right lung and associated rightward mediastinal shift.  Widespread osseous metastases, as described above, including interval progression of a left humeral head lesion.  Otherwise, no evidence of metastatic disease in the chest.   CT ABDOMEN AND PELVIS  Findings:  Small epiphrenic diverticulum (series 2/image 45).  Stable hypoperfusion/focal heterogeneity involving the medial aspect of the posterior segment right hepatic lobe (series 2/image 43), unchanged from 06/03/2011.  Spleen, pancreas, and adrenal glands are within normal limits.  Gallbladder is unremarkable.  No  intrahepatic or extrahepatic ductal dilatation.  Kidneys within normal limits.  No hydronephrosis.  No evidence of bowel obstruction.  Atherosclerotic calcifications of the abdominal aorta and branch vessels.  No abdominopelvic ascites.  No suspicious abdominopelvic lymphadenopathy.  Status post hysterectomy.  No adnexal masses.  Bladder is within normal limits.  Stable osseous metastases, involving L2, L4, L5, sacrum, right pelvis, and bilateral proximal femurs. Status post ORIF of the right proximal femur.  IMPRESSION: Stable osseous metastases in the lumbar spine, pelvis, and bilateral proximal femurs.  Otherwise, no evidence of metastatic disease in the abdomen/pelvis.  Original Report Authenticated By: Charline Bills, M.D.     ASSESSMENT: This is a very pleasant 61 years old white female with metastatic non-small cell lung cancer and positive EGFR mutation. She is currently on treatment with Tarceva status post 28 months. The patient is doing fine and tolerating her treatment fairly well. She has no evidence for disease progression except for the mild increase in the left humeral head lesion on the last restaging CT scan. The patient was discussed with [name]. As a palliative radiation has resolved her complaints of increased pain and she is now decreased her use of Vicodin we will continue her on her MS Contin at 30 mg by mouth every 12 hours with Vicodin for only occasional breakthrough relief as needed. She is given a prescription for MS Contin 30 mg tablets one by mouth every 12 hours for pain a total of 60 with no refill. She will follow with [name] in one month with a repeat CBC differential C. met and CT of the chest abdomen and pelvis with contrast to reevaluate her disease.  She will continue on her monthly Xgeva injections and will receive her injection today for her bone metastasis.    Laural Benes, Saniyya Gau E, PA-C   All questions were answered. The patient knows to call the clinic  with any problems, questions or concerns. We can certainly see the patient much sooner if necessary.

## 2012-02-20 ENCOUNTER — Ambulatory Visit: Payer: Managed Care, Other (non HMO)

## 2012-02-20 NOTE — Progress Notes (Signed)
  Radiation Oncology         [phone] ________________________________  Name: Makayla [name] MRN: [id number]  Date: 02/17/2012  DOB: 1951/06/18  End of Treatment Note  Diagnosis:  Metastatic lung cancer to spine/sacrum (retreat)  Indication for treatment:  Palliative  Radiation treatment dates:  02/06/2012-02/17/2012  Site/dose:   L4-sacrum  / 25 Gy at 2.5 Wallace Cullens per fraction x 10 fractions  Beams/energy:   4 field /  15 MV photons  Narrative: The patient tolerated radiation treatment relatively well.  She had decreasing pain throughout her treatment.  She did develop some nausea at the end of treatment which was controlled.   Plan: The patient has completed radiation treatment. The patient will return to radiation oncology clinic for routine followup in one month. I advised them to call or return sooner if they have any questions or concerns related to their recovery or treatment.  ------------------------------------------------  Lurline Hare, MD

## 2012-02-21 ENCOUNTER — Ambulatory Visit: Payer: Managed Care, Other (non HMO)

## 2012-02-22 ENCOUNTER — Other Ambulatory Visit: Payer: Self-pay | Admitting: *Deleted

## 2012-02-22 ENCOUNTER — Ambulatory Visit: Payer: Managed Care, Other (non HMO)

## 2012-02-22 DIAGNOSIS — C3490 Malignant neoplasm of unspecified part of unspecified bronchus or lung: Secondary | ICD-10-CM

## 2012-02-22 MED ORDER — HYDROCODONE-ACETAMINOPHEN 5-500 MG PO TABS: ORAL_TABLET | ORAL | Status: DC

## 2012-02-22 NOTE — Telephone Encounter (Signed)
Called patient to clarify refill request as instructed by providers.  She did request this refill.  Reports she has cut back to 1 pill every 7 to 8 hrs but is still having pain and would like one refill.

## 2012-02-23 ENCOUNTER — Ambulatory Visit: Payer: Managed Care, Other (non HMO)

## 2012-03-05 ENCOUNTER — Other Ambulatory Visit: Payer: Self-pay | Admitting: *Deleted

## 2012-03-05 DIAGNOSIS — C3490 Malignant neoplasm of unspecified part of unspecified bronchus or lung: Secondary | ICD-10-CM

## 2012-03-05 MED ORDER — HYDROCODONE-ACETAMINOPHEN 5-500 MG PO TABS: ORAL_TABLET | ORAL | Status: DC

## 2012-03-09 ENCOUNTER — Ambulatory Visit (HOSPITAL_COMMUNITY)
Admission: RE | Admit: 2012-03-09 | Discharge: 2012-03-09 | Disposition: A | Discharge status: Home / Self Care | Payer: Managed Care, Other (non HMO) | Source: Ambulatory Visit | Attending: Physician Assistant | Admitting: Physician Assistant

## 2012-03-09 ENCOUNTER — Encounter (HOSPITAL_COMMUNITY): Payer: Self-pay

## 2012-03-09 DIAGNOSIS — Z79899 Other long term (current) drug therapy: Secondary | ICD-10-CM | POA: Insufficient documentation

## 2012-03-09 DIAGNOSIS — Z90710 Acquired absence of both cervix and uterus: Secondary | ICD-10-CM | POA: Insufficient documentation

## 2012-03-09 DIAGNOSIS — C7951 Secondary malignant neoplasm of bone: Secondary | ICD-10-CM | POA: Insufficient documentation

## 2012-03-09 DIAGNOSIS — J9819 Other pulmonary collapse: Secondary | ICD-10-CM | POA: Insufficient documentation

## 2012-03-09 DIAGNOSIS — C3490 Malignant neoplasm of unspecified part of unspecified bronchus or lung: Secondary | ICD-10-CM | POA: Insufficient documentation

## 2012-03-09 DIAGNOSIS — M899 Disorder of bone, unspecified: Secondary | ICD-10-CM | POA: Insufficient documentation

## 2012-03-09 MED ORDER — IOHEXOL 300 MG/ML  SOLN
100.0000 mL | Freq: Once | INTRAMUSCULAR | Status: AC | PRN
  Administered 2012-03-09: 100 mL via INTRAVENOUS

## 2012-03-12 NOTE — Progress Notes (Signed)
Encounter addended by: Tessa Lerner, RN on: 03/12/2012  3:54 PM<BR>     Documentation filed: Charges VN

## 2012-03-14 ENCOUNTER — Ambulatory Visit (HOSPITAL_BASED_OUTPATIENT_CLINIC_OR_DEPARTMENT_OTHER): Payer: Managed Care, Other (non HMO) | Admitting: Internal Medicine

## 2012-03-14 ENCOUNTER — Other Ambulatory Visit (HOSPITAL_BASED_OUTPATIENT_CLINIC_OR_DEPARTMENT_OTHER): Payer: Managed Care, Other (non HMO) | Admitting: Lab

## 2012-03-14 ENCOUNTER — Telehealth: Payer: Self-pay | Admitting: Internal Medicine

## 2012-03-14 ENCOUNTER — Ambulatory Visit (HOSPITAL_BASED_OUTPATIENT_CLINIC_OR_DEPARTMENT_OTHER): Payer: Managed Care, Other (non HMO)

## 2012-03-14 VITALS — BP 132/84 | HR 103 | Temp 97.8°F

## 2012-03-14 DIAGNOSIS — C3430 Malignant neoplasm of lower lobe, unspecified bronchus or lung: Secondary | ICD-10-CM

## 2012-03-14 DIAGNOSIS — C3490 Malignant neoplasm of unspecified part of unspecified bronchus or lung: Secondary | ICD-10-CM

## 2012-03-14 DIAGNOSIS — R52 Pain, unspecified: Secondary | ICD-10-CM

## 2012-03-14 DIAGNOSIS — C7951 Secondary malignant neoplasm of bone: Secondary | ICD-10-CM

## 2012-03-14 DIAGNOSIS — C7952 Secondary malignant neoplasm of bone marrow: Secondary | ICD-10-CM

## 2012-03-14 LAB — CBC WITH DIFFERENTIAL/PLATELET
Basophils Absolute: 0 10*3/uL (ref 0.0–0.1)
EOS%: 2.3 % (ref 0.0–7.0)
Eosinophils Absolute: 0.1 10*3/uL (ref 0.0–0.5)
HCT: 37.6 % (ref 34.8–46.6)
HGB: 12.7 g/dL (ref 11.6–15.9)
MCH: 30.7 pg (ref 25.1–34.0)
MCV: 90.5 fL (ref 79.5–101.0)
MONO%: 12 % (ref 0.0–14.0)
NEUT%: 69.7 % (ref 38.4–76.8)
Platelets: 244 10*3/uL (ref 145–400)
RDW: 12.9 % (ref 11.2–14.5)
WBC: 4.1 10*3/uL (ref 3.9–10.3)

## 2012-03-14 LAB — COMPREHENSIVE METABOLIC PANEL
Albumin: 3.7 g/dL (ref 3.5–5.2)
Alkaline Phosphatase: 134 U/L — ABNORMAL HIGH (ref 39–117)
BUN: 14 mg/dL (ref 6–23)
Calcium: 9 mg/dL (ref 8.4–10.5)
Creatinine, Ser: 0.85 mg/dL (ref 0.50–1.10)
Glucose, Bld: 93 mg/dL (ref 70–99)
Potassium: 4.2 mEq/L (ref 3.5–5.3)

## 2012-03-14 MED ORDER — MORPHINE SULFATE ER 30 MG PO TBCR: 30.0000 mg | EXTENDED_RELEASE_TABLET | Freq: Two times a day (BID) | ORAL | Status: DC

## 2012-03-14 MED ORDER — DENOSUMAB 120 MG/1.7ML ~~LOC~~ SOLN
120.0000 mg | Freq: Once | SUBCUTANEOUS | Status: AC
  Administered 2012-03-14: 120 mg via SUBCUTANEOUS
  Filled 2012-03-14: qty 1.7

## 2012-03-14 NOTE — Telephone Encounter (Signed)
Gave appt calendar to patient for July  And August 2013 lab, ML and injections

## 2012-03-14 NOTE — Progress Notes (Signed)
[facility] [phone]   [phone]  OFFICE PROGRESS NOTE  Makayla Pounds, MD [address] [facility], [place]. [place] [address]  DIAGNOSIS: Metastatic non-small cell lung cancer adenocarcinoma, diagnosed in December 2010 in a patient with a remote history of smoking and positive EGFR mutation.   PRIOR THERAPY:  1. Status post palliative radiotherapy to the right hilum as well as anterior inferior ribs and lumbar spine from L2-L5 under the care Dr. Shon Baton.  2. status post right hip prophylactic trochanteric femoral nail and under the care of Dr. Eulas Post   CURRENT THERAPY:  1. Tarceva at 150 mg by mouth daily status post 29 months of treatment  2. Xgeva 120 mg subcutaneously given on a monthly basis for bone metastasis status post 7 treatments  3. palliative radiotherapy to his lumbar spine under the care of Dr.Wentworth   INTERVAL HISTORY: Makayla [name] 61 y.o. female returns to the clinic today for followup visit accompanied by her sister and brother. The patient is feeling fine today except for fatigue. She also lost more than 15 [name] in the last few months after her hip surgery. She is to have mild pain in the lower back. She is currently on MS Contin 30 mg every 12 hours in addition to Vicodin on as-needed basis. The patient denied having any significant chest pain or shortness of breath. She has repeat CT scan of the chest, abdomen and pelvis performed recently and she is here today for evaluation and discussion of her scan results.   MEDICAL HISTORY: Past Medical History  Diagnosis Date  . COPD (chronic obstructive pulmonary disease)   . Lung cancer     lung ca dx 12/10  . Lung cancer 09/01/2011    bone mets    ALLERGIES:  is allergic to sulfonamide derivatives.  MEDICATIONS:  Current Outpatient Prescriptions  Medication Sig Dispense Refill  . ALPRAZolam (XANAX) 0.5 MG tablet Take 0.5 mg by mouth as  needed.        . calcium-vitamin D (OSCAL WITH D) 500-200 MG-UNIT per tablet Take 1 tablet by mouth daily.        . clindamycin (CLEOCIN T) 1 % lotion Apply topically 2 (two) times daily.        Marland Kitchen denosumab (XGEVA) 120 MG/1.7ML SOLN Inject 120 mg into the skin once.      . docusate sodium (COLACE) 100 MG capsule Take 100 mg by mouth as needed.        . erlotinib (TARCEVA) 150 MG tablet Take 1 tablet (150 mg total) by mouth daily. Take on an empty stomach 1 hour before meals or 2 hours after.  30 tablet  1  . ferrous sulfate 325 (65 FE) MG tablet Take 325 mg by mouth 2 (two) times daily.        Marland Kitchen HYDROcodone-acetaminophen (VICODIN) 5-500 MG per tablet Take 1 tablet by mouth every 4-6 hrs prn breakthrough pain  60 tablet  0  . Magnesium 250 MG TABS Take 1 tablet by mouth daily.      Marland Kitchen morphine (MS CONTIN) 30 MG 12 hr tablet Take 1 tablet (30 mg total) by mouth 2 (two) times daily.  60 tablet  0  . Cholecalciferol (VITAMIN D-3 PO) Take 1,000 Units by mouth 2 (two) times daily.        . Iron-Vit C-Vit B12-Folic Acid (IRON 100 PLUS PO) Take 65 mg by mouth daily. OTC       .  magnesium citrate solution Take 500 mLs by mouth once.      . methocarbamol (ROBAXIN) 500 MG tablet Take 500 mg by mouth 4 (four) times daily.        Marland Kitchen DISCONTD: warfarin (COUMADIN) 5 MG tablet Take 5 mg by mouth daily.          SURGICAL HISTORY:  Past Surgical History  Procedure Date  . Abdominal hysterectomy   . Surgery right leg, rod and pin     REVIEW OF SYSTEMS:  A comprehensive review of systems was negative except for: Constitutional: positive for fatigue and weight loss Musculoskeletal: positive for back pain   PHYSICAL EXAMINATION: General appearance: alert, cooperative and no distress Head: Normocephalic, without obvious abnormality, atraumatic Neck: no adenopathy Lymph nodes: Cervical, supraclavicular, and axillary nodes normal. Resp: Absent breath sounds and dullness to percussion on the right lung field,  clear to auscultation on the left Cardio: regular rate and rhythm, S1, S2 normal, no murmur, click, rub or gallop GI: soft, non-tender; bowel sounds normal; no masses,  no organomegaly Extremities: extremities normal, atraumatic, no cyanosis or edema Neurologic: Alert and oriented X 3, normal strength and tone. Normal symmetric reflexes. Normal coordination and gait  ECOG PERFORMANCE STATUS: 1 - Symptomatic but completely ambulatory  There were no vitals taken for this visit.  LABORATORY DATA: Lab Results  Component Value Date   WBC 4.1 03/14/2012   HGB 12.7 03/14/2012   HCT 37.6 03/14/2012   MCV 90.5 03/14/2012   PLT 244 03/14/2012      Chemistry      Component Value Date/Time   NA 135 02/15/2012 1424   NA 137 09/21/2011 0902   K 4.0 02/15/2012 1424   K 4.2 09/21/2011 0902   CL 102 02/15/2012 1424   CL 101 09/21/2011 0902   CO2 25 02/15/2012 1424   CO2 28 09/21/2011 0902   BUN 12 02/15/2012 1424   BUN 16 09/21/2011 0902   CREATININE 0.80 02/15/2012 1424   CREATININE 1.0 09/21/2011 0902      Component Value Date/Time   CALCIUM 9.2 02/15/2012 1424   CALCIUM 8.7 09/21/2011 0902   ALKPHOS 95 02/15/2012 1424   ALKPHOS 130* 09/21/2011 0902   AST 18 02/15/2012 1424   AST 27 09/21/2011 0902   ALT 11 02/15/2012 1424   BILITOT 0.4 02/15/2012 1424   BILITOT 0.60 09/21/2011 0902       RADIOGRAPHIC STUDIES: Ct Chest W Contrast  03/09/2012  *RADIOLOGY REPORT*  Clinical Data:  Follow-up metastatic lung carcinoma. Ongoing chemotherapy.  CT CHEST, ABDOMEN AND PELVIS WITH CONTRAST  Technique:  Multidetector CT imaging of the chest, abdomen and pelvis was performed following the standard protocol during bolus administration of intravenous contrast.  Contrast: OMNIPAQUE IOHEXOL 300 MG/ML  SOLN  Comparison:  12/16/2011  CT CHEST  Findings:  Chronic right lung collapse with associated mediastinal shift is stable in appearance.  Hyperinflated left lung remains clear.  No suspicious pulmonary  nodules or masses are identified. No evidence of pulmonary infiltrate.  No evidence of pleural or pericardial effusion.  There is no evidence of mediastinal or hilar masses.  No adenopathy seen elsewhere within the thorax. Sclerotic bone metastases are stable in appearance.  IMPRESSION:  1.  Stable appearance of sclerotic bone metastases. 2.  Chronic right lung collapse.  No evidence of soft tissue recurrence or soft tissue metastatic disease.  CT ABDOMEN AND PELVIS  Findings:  Tiny sub-centimeter low attenuation lesion in the posterior right hepatic lobe  is stable.  No other liver lesions are identified.  Both adrenal glands are normal in appearance as well as the other abdominal parenchymal organs.  Gallbladder is unremarkable.  No evidence of hydronephrosis.  No soft tissue masses or lymphadenopathy seen elsewhere within the abdomen or pelvis.  Prior hysterectomy noted.  No evidence of inflammatory process or ascites.  No evidence of bowel wall thickening or dilatation.  Sclerotic bone metastases involving the lumbar spine and pelvis are stable in appearance.  IMPRESSION:  1.  Stable sclerotic bone metastases. 2.  No evidence of soft tissue metastatic disease within the abdomen or pelvis.  Original Report Authenticated By: Danae Orleans, M.D.   Ct Abdomen Pelvis W Contrast  03/09/2012  *RADIOLOGY REPORT*  Clinical Data:  Follow-up metastatic lung carcinoma. Ongoing chemotherapy.  CT CHEST, ABDOMEN AND PELVIS WITH CONTRAST  Technique:  Multidetector CT imaging of the chest, abdomen and pelvis was performed following the standard protocol during bolus administration of intravenous contrast.  Contrast: OMNIPAQUE IOHEXOL 300 MG/ML  SOLN  Comparison:  12/16/2011  CT CHEST  Findings:  Chronic right lung collapse with associated mediastinal shift is stable in appearance.  Hyperinflated left lung remains clear.  No suspicious pulmonary nodules or masses are identified. No evidence of pulmonary infiltrate.  No  evidence of pleural or pericardial effusion.  There is no evidence of mediastinal or hilar masses.  No adenopathy seen elsewhere within the thorax. Sclerotic bone metastases are stable in appearance.  IMPRESSION:  1.  Stable appearance of sclerotic bone metastases. 2.  Chronic right lung collapse.  No evidence of soft tissue recurrence or soft tissue metastatic disease.  CT ABDOMEN AND PELVIS  Findings:  Tiny sub-centimeter low attenuation lesion in the posterior right hepatic lobe is stable.  No other liver lesions are identified.  Both adrenal glands are normal in appearance as well as the other abdominal parenchymal organs.  Gallbladder is unremarkable.  No evidence of hydronephrosis.  No soft tissue masses or lymphadenopathy seen elsewhere within the abdomen or pelvis.  Prior hysterectomy noted.  No evidence of inflammatory process or ascites.  No evidence of bowel wall thickening or dilatation.  Sclerotic bone metastases involving the lumbar spine and pelvis are stable in appearance.  IMPRESSION:  1.  Stable sclerotic bone metastases. 2.  No evidence of soft tissue metastatic disease within the abdomen or pelvis.  Original Report Authenticated By: Danae Orleans, M.D.    ASSESSMENT: This is a very pleasant 61 years old white female with metastatic non-small cell lung cancer, adenocarcinoma with positive EGFR mutation, currently on treatment with Tarceva status post 29 months of treatment. The patient is doing fine and tolerating her treatment with Tarceva fairly well with no significant adverse effects. He has no evidence for disease progression on his recent scan.  PLAN: I discussed the scan results with the patient and her family. I recommended for her to continue treatment was Tarceva at the current dose 150 mg by mouth daily. She was given a refill of her pain medication in the form of MS Contin. The patient would come back for followup visit in one month's for reevaluation and repeat blood work She  was advised to call me immediately if she has any concerning symptoms in the interval.  All questions were answered. The patient knows to call the clinic with any problems, questions or concerns. We can certainly see the patient much sooner if necessary.  I spent 15 minutes counseling the patient face to  face. The total time spent in the appointment was 25 minutes.

## 2012-04-11 ENCOUNTER — Ambulatory Visit (HOSPITAL_BASED_OUTPATIENT_CLINIC_OR_DEPARTMENT_OTHER): Payer: Managed Care, Other (non HMO)

## 2012-04-11 ENCOUNTER — Other Ambulatory Visit (HOSPITAL_BASED_OUTPATIENT_CLINIC_OR_DEPARTMENT_OTHER): Payer: Managed Care, Other (non HMO) | Admitting: Lab

## 2012-04-11 ENCOUNTER — Ambulatory Visit (HOSPITAL_BASED_OUTPATIENT_CLINIC_OR_DEPARTMENT_OTHER): Payer: Managed Care, Other (non HMO) | Admitting: Physician Assistant

## 2012-04-11 ENCOUNTER — Telehealth: Payer: Self-pay | Admitting: Internal Medicine

## 2012-04-11 ENCOUNTER — Encounter: Payer: Self-pay | Admitting: Physician Assistant

## 2012-04-11 VITALS — BP 124/79 | HR 94 | Temp 97.0°F | Ht 64.0 in | Wt 153.2 lb

## 2012-04-11 DIAGNOSIS — C3490 Malignant neoplasm of unspecified part of unspecified bronchus or lung: Secondary | ICD-10-CM

## 2012-04-11 DIAGNOSIS — C3430 Malignant neoplasm of lower lobe, unspecified bronchus or lung: Secondary | ICD-10-CM

## 2012-04-11 DIAGNOSIS — R52 Pain, unspecified: Secondary | ICD-10-CM

## 2012-04-11 DIAGNOSIS — C7951 Secondary malignant neoplasm of bone: Secondary | ICD-10-CM

## 2012-04-11 DIAGNOSIS — C7952 Secondary malignant neoplasm of bone marrow: Secondary | ICD-10-CM

## 2012-04-11 LAB — COMPREHENSIVE METABOLIC PANEL
Albumin: 3.7 g/dL (ref 3.5–5.2)
BUN: 11 mg/dL (ref 6–23)
CO2: 25 mEq/L (ref 19–32)
Calcium: 8.7 mg/dL (ref 8.4–10.5)
Chloride: 103 mEq/L (ref 96–112)
Glucose, Bld: 87 mg/dL (ref 70–99)
Potassium: 4.1 mEq/L (ref 3.5–5.3)

## 2012-04-11 LAB — CBC WITH DIFFERENTIAL/PLATELET
Basophils Absolute: 0 10*3/uL (ref 0.0–0.1)
Eosinophils Absolute: 0 10*3/uL (ref 0.0–0.5)
HCT: 33.2 % — ABNORMAL LOW (ref 34.8–46.6)
HGB: 11.1 g/dL — ABNORMAL LOW (ref 11.6–15.9)
NEUT#: 2.4 10*3/uL (ref 1.5–6.5)
NEUT%: 69.3 % (ref 38.4–76.8)
RDW: 13.5 % (ref 11.2–14.5)
lymph#: 0.6 10*3/uL — ABNORMAL LOW (ref 0.9–3.3)

## 2012-04-11 MED ORDER — MORPHINE SULFATE ER 30 MG PO TBCR: 30.0000 mg | EXTENDED_RELEASE_TABLET | Freq: Two times a day (BID) | ORAL | Status: DC

## 2012-04-11 MED ORDER — DENOSUMAB 120 MG/1.7ML ~~LOC~~ SOLN
120.0000 mg | Freq: Once | SUBCUTANEOUS | Status: AC
  Administered 2012-04-11: 120 mg via SUBCUTANEOUS
  Filled 2012-04-11: qty 1.7

## 2012-04-11 NOTE — Telephone Encounter (Signed)
Gave pt appt for August 2013 lab, Ml and injections

## 2012-04-12 ENCOUNTER — Other Ambulatory Visit: Payer: Self-pay | Admitting: Medical Oncology

## 2012-04-12 ENCOUNTER — Encounter: Payer: Self-pay | Admitting: Radiation Oncology

## 2012-04-12 ENCOUNTER — Ambulatory Visit
Admission: RE | Admit: 2012-04-12 | Discharge: 2012-04-12 | Disposition: A | Discharge status: Home / Self Care | Payer: Managed Care, Other (non HMO) | Source: Ambulatory Visit | Attending: Radiation Oncology | Admitting: Radiation Oncology

## 2012-04-12 VITALS — BP 106/76 | HR 94 | Temp 97.6°F | Resp 18 | Wt 153.7 lb

## 2012-04-12 DIAGNOSIS — C3490 Malignant neoplasm of unspecified part of unspecified bronchus or lung: Secondary | ICD-10-CM

## 2012-04-12 MED ORDER — ERLOTINIB HCL 150 MG PO TABS: 150.0000 mg | ORAL_TABLET | Freq: Every day | ORAL | Status: DC

## 2012-04-12 MED ORDER — HYDROCODONE-ACETAMINOPHEN 5-500 MG PO TABS: ORAL_TABLET | ORAL | Status: DC

## 2012-04-12 NOTE — Progress Notes (Addendum)
Patient presents to the clinic today unaccompanied for a follow up appointment with Dr. Michell Heinrich. Patient is alert and oriented to person, place, and time. No distress noted. Steady gait noted. Patient reports walking from [place] to the Cancer center without difficulty. Pleasant affect noted. Patient denies pain at this time. Patient reports that she has been off the Vicodin for two week but, continues to take Tylenol and Morphine. Patient reports battling with constipation despite stool softeners, laxatives and increased water intake. Patient seen by Tiana Loft yesterday and reports receiving a "good report."  Patient to follow up with Adrena again 05/09/2012 with labs and xgeva. Patient denies nausea, vomiting, headache or dizziness. Patient reports energy has increased since she stopped the Vicodin. Denies numbness. Reported all findings to Dr. Michell Heinrich.

## 2012-04-12 NOTE — Progress Notes (Signed)
Department of Radiation Oncology  Phone:  [phone] Fax:        [phone]   Name: Makayla [name]   DOB: July 03, 1951  MRN: [id number]    Date: 04/12/2012  Follow Up Visit Note  Diagnosis: Metastatic lung cancer  Interval since last radiation: 1 month  Interval History: Makayla [name] presents today for routine followup.  She is about a month out from treatment of her spine and sacrum in a retreatment setting. I gave her an additional 62 [address] C[address] and she's had a complete response in terms of pain. She still continues to take her MS Contin twice a day and Tylenol on a when necessary basis. She's been off of her when necessary Vicodin for about 2 weeks. [facility] She even told her son he did not accompany her on her visit today because she felt in bed. [facility]'s thinking about [facility]ining [facility] [employer] or another gym in [employer] area.  Allergies:  Allergies  Allergen Reactions  . Sulfonamide Derivatives     REACTION: nausea    Medications:  Current Outpatient Prescriptions  Medication Sig Dispense Refill  . ALPRAZolam (XANAX) 0.5 MG tablet Take 0.5 mg by mouth as needed.        . calcium-vitamin D (OSCAL WITH D) 500-200 MG-UNIT per tablet Take 1 tablet by mouth daily.        . Cholecalciferol (VITAMIN D-3 PO) Take 1,000 Units by mouth 2 (two) times daily.        . clindamycin (CLEOCIN T) 1 % lotion Apply topically 2 (two) times daily.        Marland Kitchen denosumab (XGEVA) 120 MG/1.7ML SOLN Inject 120 mg into the skin once.      . docusate sodium (COLACE) 100 MG capsule Take 100 mg by mouth as needed.        . erlotinib (TARCEVA) 150 MG tablet Take 1 tablet (150 mg total) by mouth daily. Take on an empty stomach 1 hour before meals or 2 hours after.  30 tablet  1  . ferrous sulfate 325 (65 FE) MG tablet Take 325 mg by mouth 2 (two) times daily.        . Iron-Vit C-Vit B12-Folic Acid (IRON 100 PLUS PO) Take 65 mg by mouth daily. OTC       . Magnesium 250 MG TABS Take 1  tablet by mouth daily.      . magnesium citrate solution Take 500 mLs by mouth once.      . morphine (MS CONTIN) 30 MG 12 hr tablet Take 1 tablet (30 mg total) by mouth 2 (two) times daily.  60 tablet  0  . HYDROcodone-acetaminophen (VICODIN) 5-500 MG per tablet Take 1 tablet by mouth every 4-6 hrs prn breakthrough pain  60 tablet  0  . methocarbamol (ROBAXIN) 500 MG tablet Take 500 mg by mouth 4 (four) times daily.        . prochlorperazine (COMPAZINE) 10 MG tablet       . DISCONTD: HYDROcodone-acetaminophen (VICODIN) 5-500 MG per tablet Take 1 tablet by mouth every 4-6 hrs prn breakthrough pain  60 tablet  0  . DISCONTD: warfarin (COUMADIN) 5 MG tablet Take 5 mg by mouth daily.          Physical Exam:   weight is 153 lb 11.2 oz (69.718 kg). Her oral temperature is 97.6 F (36.4 C). Her blood pressure is 106/76 and her pulse is 94. Her respiration is 18.  She has n no pain. Her gait is normal. She has 5 out of 5 strength in her bilateral lower extremities.  IMPRESSION: Makayla [name] is a 60 y.o. female who has responded well from her radiation standpoint  PLAN:  Charlaine looks great. We discussed that further treatment in this area would not be possible. We discussed referral to orthopedic surgery if she did have any more issues. We discussed that we could treat other areas should they come out. I encouraged her to carefully advanced her exercise tolerance. I did give her a prescription for Vicodin as she is interested in coming off of her MS Contin. I think this is a great idea in may be she can just get by with some when necessary medications. I have not scheduled followup with me. She is regular schedule followup [name] in August. I be happy to see her back at any point in the future.    Lurline Hare, MD

## 2012-04-12 NOTE — Telephone Encounter (Signed)
Faxed to [employer]

## 2012-04-15 NOTE — Progress Notes (Signed)
Makayla [name] [phone]   Fax:([phone]  OFFICE PROGRESS NOTE  [name], MD [address] [facility] [place], [place]. [place] [address]  DIAGNOSIS: Metastatic non-small cell lung cancer adenocarcinoma, diagnosed in December 2010 in a patient with a remote history of smoking and positive EGFR mutation.   PRIOR THERAPY:  1. Status post palliative radiotherapy to the right hilum as well as anterior inferior ribs and lumbar spine from L2-L5 under the care Dr. Shon Baton.  2. status post right hip prophylactic trochanteric femoral nail and under the care of Dr. Eulas Post  3. palliative radiotherapy to his lumbar spine under the care of Dr.Wentworth  CURRENT THERAPY:  1. Tarceva at 150 mg by mouth daily status post 30 months of treatment  2. Xgeva 120 mg subcutaneously given on a monthly basis for bone metastasis status post 8 treatments     INTERVAL HISTORY: Makayla [name] 61 y.o. female returns to the clinic today for followup visit accompanied by her sister.  The patient is feeling fine today except for fatigue. Her pain is under better control and she states that she has discontinued her Vicodin for breakthrough pain. She continues on MS Contin and now takes Tylenol on an as-needed basis for breakthrough pain. Regarding the Tarceva she continues to have dry skin and a mild rash on her face but has not had any problems with diarrhea. She has in fact had some issues with constipation. She has been eating better and has recently gained some weight. The patient denied having any significant chest pain or shortness of breath.   MEDICAL HISTORY: Past Medical History  Diagnosis Date  . COPD (chronic obstructive pulmonary disease)   . Lung cancer     lung ca dx 12/10  . Lung cancer 09/01/2011    bone mets    ALLERGIES:  is allergic to sulfonamide derivatives.  MEDICATIONS:  Current Outpatient Prescriptions  Medication Sig Dispense  Refill  . ALPRAZolam (XANAX) 0.5 MG tablet Take 0.5 mg by mouth as needed.        . calcium-vitamin D (OSCAL WITH D) 500-200 MG-UNIT per tablet Take 1 tablet by mouth daily.        . Cholecalciferol (VITAMIN D-3 PO) Take 1,000 Units by mouth 2 (two) times daily.        . clindamycin (CLEOCIN T) 1 % lotion Apply topically 2 (two) times daily.        Marland Kitchen denosumab (XGEVA) 120 MG/1.7ML SOLN Inject 120 mg into the skin once.      . docusate sodium (COLACE) 100 MG capsule Take 100 mg by mouth as needed.        . erlotinib (TARCEVA) 150 MG tablet Take 1 tablet (150 mg total) by mouth daily. Take on an empty stomach 1 hour before meals or 2 hours after.  30 tablet  1  . ferrous sulfate 325 (65 FE) MG tablet Take 325 mg by mouth 2 (two) times daily.        Marland Kitchen HYDROcodone-acetaminophen (VICODIN) 5-500 MG per tablet Take 1 tablet by mouth every 4-6 hrs prn breakthrough pain  60 tablet  0  . Iron-Vit C-Vit B12-Folic Acid (IRON 100 PLUS PO) Take 65 mg by mouth daily. OTC       . Magnesium 250 MG TABS Take 1 tablet by mouth daily.      . magnesium citrate solution Take 500 mLs by mouth once.      Marland Kitchen  methocarbamol (ROBAXIN) 500 MG tablet Take 500 mg by mouth 4 (four) times daily.        Marland Kitchen morphine (MS CONTIN) 30 MG 12 hr tablet Take 1 tablet (30 mg total) by mouth 2 (two) times daily.  60 tablet  0  . prochlorperazine (COMPAZINE) 10 MG tablet       . DISCONTD: warfarin (COUMADIN) 5 MG tablet Take 5 mg by mouth daily.          SURGICAL HISTORY:  Past Surgical History  Procedure Date  . Abdominal hysterectomy   . Surgery right leg, rod and pin     REVIEW OF SYSTEMS:  Pertinent items are noted in HPI.   PHYSICAL EXAMINATION: General appearance: alert, cooperative and no distress Head: Normocephalic, without obvious abnormality, atraumatic Neck: no adenopathy Lymph nodes: Cervical, supraclavicular, and axillary nodes normal. Resp: Absent breath sounds and dullness to percussion on the right lung field,  clear to auscultation on the left Cardio: regular rate and rhythm, S1, S2 normal, no murmur, click, rub or gallop GI: soft, non-tender; bowel sounds normal; no masses,  no organomegaly Extremities: extremities normal, atraumatic, no cyanosis or edema Neurologic: Alert and oriented X 3, normal strength and tone. Normal symmetric reflexes. Normal coordination and gait  ECOG PERFORMANCE STATUS: 1 - Symptomatic but completely ambulatory  Blood pressure 124/79, pulse 94, temperature 97 F (36.1 C), temperature source Oral, height 5\' 4"  (1.626 m), weight 153 lb 3.2 oz (69.491 kg).  LABORATORY DATA: Lab Results  Component Value Date   WBC 3.5* 04/11/2012   HGB 11.1* 04/11/2012   HCT 33.2* 04/11/2012   MCV 90.8 04/11/2012   PLT 238 04/11/2012      Chemistry      Component Value Date/Time   NA 138 04/11/2012 1316   NA 137 09/21/2011 0902   K 4.1 04/11/2012 1316   K 4.2 09/21/2011 0902   CL 103 04/11/2012 1316   CL 101 09/21/2011 0902   CO2 25 04/11/2012 1316   CO2 28 09/21/2011 0902   BUN 11 04/11/2012 1316   BUN 16 09/21/2011 0902   CREATININE 0.75 04/11/2012 1316   CREATININE 1.0 09/21/2011 0902      Component Value Date/Time   CALCIUM 8.7 04/11/2012 1316   CALCIUM 8.7 09/21/2011 0902   ALKPHOS 135* 04/11/2012 1316   ALKPHOS 130* 09/21/2011 0902   AST 24 04/11/2012 1316   AST 27 09/21/2011 0902   ALT 17 04/11/2012 1316   BILITOT 0.4 04/11/2012 1316   BILITOT 0.60 09/21/2011 0902       RADIOGRAPHIC STUDIES: Ct Chest W Contrast  03/09/2012  *RADIOLOGY REPORT*  Clinical Data:  Follow-up metastatic lung carcinoma. Ongoing chemotherapy.  CT CHEST, ABDOMEN AND PELVIS WITH CONTRAST  Technique:  Multidetector CT imaging of the chest, abdomen and pelvis was performed following the standard protocol during bolus administration of intravenous contrast.  Contrast: OMNIPAQUE IOHEXOL 300 MG/ML  SOLN  Comparison:  12/16/2011  CT CHEST  Findings:  Chronic right lung collapse with associated  mediastinal shift is stable in appearance.  Hyperinflated left lung remains clear.  No suspicious pulmonary nodules or masses are identified. No evidence of pulmonary infiltrate.  No evidence of pleural or pericardial effusion.  There is no evidence of mediastinal or hilar masses.  No adenopathy seen elsewhere within the thorax. Sclerotic bone metastases are stable in appearance.  IMPRESSION:  1.  Stable appearance of sclerotic bone metastases. 2.  Chronic right lung collapse.  No evidence of soft  tissue recurrence or soft tissue metastatic disease.  CT ABDOMEN AND PELVIS  Findings:  Tiny sub-centimeter low attenuation lesion in the posterior right hepatic lobe is stable.  No other liver lesions are identified.  Both adrenal glands are normal in appearance as well as the other abdominal parenchymal organs.  Gallbladder is unremarkable.  No evidence of hydronephrosis.  No soft tissue masses or lymphadenopathy seen elsewhere within the abdomen or pelvis.  Prior hysterectomy noted.  No evidence of inflammatory process or ascites.  No evidence of bowel wall thickening or dilatation.  Sclerotic bone metastases involving the lumbar spine and pelvis are stable in appearance.  IMPRESSION:  1.  Stable sclerotic bone metastases. 2.  No evidence of soft tissue metastatic disease within the abdomen or pelvis.  Original Report Authenticated By: Danae Orleans, M.D.   Ct Abdomen Pelvis W Contrast  03/09/2012  *RADIOLOGY REPORT*  Clinical Data:  Follow-up metastatic lung carcinoma. Ongoing chemotherapy.  CT CHEST, ABDOMEN AND PELVIS WITH CONTRAST  Technique:  Multidetector CT imaging of the chest, abdomen and pelvis was performed following the standard protocol during bolus administration of intravenous contrast.  Contrast: OMNIPAQUE IOHEXOL 300 MG/ML  SOLN  Comparison:  12/16/2011  CT CHEST  Findings:  Chronic right lung collapse with associated mediastinal shift is stable in appearance.  Hyperinflated left lung remains  clear.  No suspicious pulmonary nodules or masses are identified. No evidence of pulmonary infiltrate.  No evidence of pleural or pericardial effusion.  There is no evidence of mediastinal or hilar masses.  No adenopathy seen elsewhere within the thorax. Sclerotic bone metastases are stable in appearance.  IMPRESSION:  1.  Stable appearance of sclerotic bone metastases. 2.  Chronic right lung collapse.  No evidence of soft tissue recurrence or soft tissue metastatic disease.  CT ABDOMEN AND PELVIS  Findings:  Tiny sub-centimeter low attenuation lesion in the posterior right hepatic lobe is stable.  No other liver lesions are identified.  Both adrenal glands are normal in appearance as well as the other abdominal parenchymal organs.  Gallbladder is unremarkable.  No evidence of hydronephrosis.  No soft tissue masses or lymphadenopathy seen elsewhere within the abdomen or pelvis.  Prior hysterectomy noted.  No evidence of inflammatory process or ascites.  No evidence of bowel wall thickening or dilatation.  Sclerotic bone metastases involving the lumbar spine and pelvis are stable in appearance.  IMPRESSION:  1.  Stable sclerotic bone metastases. 2.  No evidence of soft tissue metastatic disease within the abdomen or pelvis.  Original Report Authenticated By: Danae Orleans, M.D.    ASSESSMENT/PLAN: This is a very pleasant 61 years old white female with metastatic non-small cell lung cancer, adenocarcinoma with positive EGFR mutation, currently on treatment with Tarceva status post 30 months of treatment. The patient is doing fine and tolerating her treatment with Tarceva fairly well with no significant adverse effects. She has no evidence for disease progression on his recent scan. The patient was discussed with [name]. She will continue on Tarceva 150 mg by mouth daily. She'll also continue on her MS Contin 30 mg by mouth every 12 hours for pain in her as needed Tylenol for breakthrough pain. She'll return in  one month for another symptom management visit with a repeat CBC differential and C. met.  Laural Benes, Trendon Zaring E, PA-C   All questions were answered. The patient knows to call the clinic with any problems, questions or concerns. We can certainly see the patient much sooner  if necessary.  I spent 15 minutes counseling the patient face to face. The total time spent in the appointment was 25 minutes.

## 2012-05-09 ENCOUNTER — Other Ambulatory Visit (HOSPITAL_BASED_OUTPATIENT_CLINIC_OR_DEPARTMENT_OTHER): Payer: Managed Care, Other (non HMO) | Admitting: Lab

## 2012-05-09 ENCOUNTER — Telehealth: Payer: Self-pay | Admitting: Internal Medicine

## 2012-05-09 ENCOUNTER — Ambulatory Visit (HOSPITAL_BASED_OUTPATIENT_CLINIC_OR_DEPARTMENT_OTHER): Payer: Managed Care, Other (non HMO)

## 2012-05-09 ENCOUNTER — Ambulatory Visit (HOSPITAL_BASED_OUTPATIENT_CLINIC_OR_DEPARTMENT_OTHER): Payer: Managed Care, Other (non HMO) | Admitting: Physician Assistant

## 2012-05-09 VITALS — BP 132/83 | HR 108 | Temp 97.9°F | Resp 20 | Ht 64.0 in | Wt 153.8 lb

## 2012-05-09 DIAGNOSIS — C3490 Malignant neoplasm of unspecified part of unspecified bronchus or lung: Secondary | ICD-10-CM

## 2012-05-09 DIAGNOSIS — C7951 Secondary malignant neoplasm of bone: Secondary | ICD-10-CM

## 2012-05-09 DIAGNOSIS — C7952 Secondary malignant neoplasm of bone marrow: Secondary | ICD-10-CM

## 2012-05-09 DIAGNOSIS — C3430 Malignant neoplasm of lower lobe, unspecified bronchus or lung: Secondary | ICD-10-CM

## 2012-05-09 LAB — CBC WITH DIFFERENTIAL/PLATELET
Basophils Absolute: 0 10*3/uL (ref 0.0–0.1)
EOS%: 1 % (ref 0.0–7.0)
Eosinophils Absolute: 0 10*3/uL (ref 0.0–0.5)
HGB: 11.5 g/dL — ABNORMAL LOW (ref 11.6–15.9)
MCH: 30.8 pg (ref 25.1–34.0)
MONO#: 0.5 10*3/uL (ref 0.1–0.9)
NEUT#: 3.3 10*3/uL (ref 1.5–6.5)
RDW: 13.5 % (ref 11.2–14.5)
WBC: 4.5 10*3/uL (ref 3.9–10.3)
lymph#: 0.6 10*3/uL — ABNORMAL LOW (ref 0.9–3.3)

## 2012-05-09 LAB — COMPREHENSIVE METABOLIC PANEL
AST: 22 U/L (ref 0–37)
Albumin: 3.8 g/dL (ref 3.5–5.2)
BUN: 12 mg/dL (ref 6–23)
Calcium: 9 mg/dL (ref 8.4–10.5)
Chloride: 101 mEq/L (ref 96–112)
Potassium: 4.3 mEq/L (ref 3.5–5.3)

## 2012-05-09 MED ORDER — DENOSUMAB 120 MG/1.7ML ~~LOC~~ SOLN
120.0000 mg | Freq: Once | SUBCUTANEOUS | Status: AC
  Administered 2012-05-09: 120 mg via SUBCUTANEOUS
  Filled 2012-05-09: qty 1.7

## 2012-05-09 MED ORDER — MORPHINE SULFATE ER 15 MG PO TBCR: 15.0000 mg | EXTENDED_RELEASE_TABLET | Freq: Two times a day (BID) | ORAL | Status: DC

## 2012-05-09 NOTE — Telephone Encounter (Signed)
appts made and printed for pt aom °

## 2012-05-13 NOTE — Progress Notes (Signed)
[facility] Telephone:([phone]) [phone]   [phone]  OFFICE PROGRESS NOTE  Makayla Pounds, MD [address] [facility], [place]. [place] [address]  DIAGNOSIS: Metastatic non-small cell lung cancer adenocarcinoma, diagnosed in December 2010 in a patient with a remote history of smoking and positive EGFR mutation.   PRIOR THERAPY:  1. Status post palliative radiotherapy to the right hilum as well as anterior inferior ribs and lumbar spine from L2-L5 under the care Dr. Shon Baton.  2. status post right hip prophylactic trochanteric femoral nail and under the care of Dr. Eulas Post  3. palliative radiotherapy to his lumbar spine under the care of Dr.Wentworth  CURRENT THERAPY:  1. Tarceva at 150 mg by mouth daily status post 31 months of treatment  2. Xgeva 120 mg subcutaneously given on a monthly basis for bone metastasis status post 9 treatments     INTERVAL HISTORY: Makayla [name] 61 y.o. female returns to the clinic today for followup visit accompanied by her sister.  The patient is feeling fine today except for fatigue. She reports that Dr. Michell Heinrich advised her to discontinue the MS Contin, however she did not tolerate this well. She had difficulty with increased pain and "sweats" She had to resume use of the Vicodin and averages 3 tablets daily. She is interested in decreasing her long-acting pain medication.Regarding the Tarceva she continues to have dry skin and a mild rash on her face but has not had any problems with diarrhea. She has in fact had some issues with constipation.  The patient denied having any significant chest pain or shortness of breath.   MEDICAL HISTORY: Past Medical History  Diagnosis Date  . COPD (chronic obstructive pulmonary disease)   . Lung cancer     lung ca dx 12/10  . Lung cancer 09/01/2011    bone mets    ALLERGIES:  is allergic to sulfonamide derivatives.  MEDICATIONS:  Current Outpatient  Prescriptions  Medication Sig Dispense Refill  . ALPRAZolam (XANAX) 0.5 MG tablet Take 0.5 mg by mouth as needed.        . calcium-vitamin D (OSCAL WITH D) 500-200 MG-UNIT per tablet Take 1 tablet by mouth daily.        . Cholecalciferol (VITAMIN D-3 PO) Take 1,000 Units by mouth 2 (two) times daily.        . clindamycin (CLEOCIN T) 1 % lotion Apply topically 2 (two) times daily.        Marland Kitchen denosumab (XGEVA) 120 MG/1.7ML SOLN Inject 120 mg into the skin once.      . docusate sodium (COLACE) 100 MG capsule Take 100 mg by mouth as needed.        . erlotinib (TARCEVA) 150 MG tablet Take 1 tablet (150 mg total) by mouth daily. Take on an empty stomach 1 hour before meals or 2 hours after.  30 tablet  1  . ferrous sulfate 325 (65 FE) MG tablet Take 325 mg by mouth 2 (two) times daily.        Marland Kitchen HYDROcodone-acetaminophen (VICODIN) 5-500 MG per tablet Take 1 tablet by mouth every 4-6 hrs prn breakthrough pain  60 tablet  0  . Iron-Vit C-Vit B12-Folic Acid (IRON 100 PLUS PO) Take 65 mg by mouth daily. OTC       . Magnesium 250 MG TABS Take 1 tablet by mouth daily.      . magnesium citrate solution Take 500 mLs by mouth once.      Marland Kitchen  methocarbamol (ROBAXIN) 500 MG tablet Take 500 mg by mouth 4 (four) times daily.        Marland Kitchen morphine (MS CONTIN) 15 MG 12 hr tablet Take 1 tablet (15 mg total) by mouth 2 (two) times daily.  60 tablet  0  . prochlorperazine (COMPAZINE) 10 MG tablet       . DISCONTD: warfarin (COUMADIN) 5 MG tablet Take 5 mg by mouth daily.          SURGICAL HISTORY:  Past Surgical History  Procedure Date  . Abdominal hysterectomy   . Surgery right leg, rod and pin     REVIEW OF SYSTEMS:  Pertinent items are noted in HPI.   PHYSICAL EXAMINATION: General appearance: alert, cooperative and no distress Head: Normocephalic, without obvious abnormality, atraumatic Neck: no adenopathy Lymph nodes: Cervical, supraclavicular, and axillary nodes normal. Resp: Absent breath sounds and dullness to  percussion on the right lung field, clear to auscultation on the left Cardio: regular rate and rhythm, S1, S2 normal, no murmur, click, rub or gallop GI: soft, non-tender; bowel sounds normal; no masses,  no organomegaly Extremities: extremities normal, atraumatic, no cyanosis or edema Neurologic: Alert and oriented X 3, normal strength and tone. Normal symmetric reflexes. Normal coordination and gait  ECOG PERFORMANCE STATUS: 1 - Symptomatic but completely ambulatory  Blood pressure 132/83, pulse 108, temperature 97.9 F (36.6 C), temperature source Oral, resp. rate 20, height 5\' 4"  (1.626 m), weight 153 lb 12.8 oz (69.763 kg).  LABORATORY DATA: Lab Results  Component Value Date   WBC 4.5 05/09/2012   HGB 11.5* 05/09/2012   HCT 34.3* 05/09/2012   MCV 91.7 05/09/2012   PLT 235 05/09/2012      Chemistry      Component Value Date/Time   NA 136 05/09/2012 1321   NA 137 09/21/2011 0902   K 4.3 05/09/2012 1321   K 4.2 09/21/2011 0902   CL 101 05/09/2012 1321   CL 101 09/21/2011 0902   CO2 24 05/09/2012 1321   CO2 28 09/21/2011 0902   BUN 12 05/09/2012 1321   BUN 16 09/21/2011 0902   CREATININE 0.72 05/09/2012 1321   CREATININE 1.0 09/21/2011 0902      Component Value Date/Time   CALCIUM 9.0 05/09/2012 1321   CALCIUM 8.7 09/21/2011 0902   ALKPHOS 144* 05/09/2012 1321   ALKPHOS 130* 09/21/2011 0902   AST 22 05/09/2012 1321   AST 27 09/21/2011 0902   ALT 16 05/09/2012 1321   BILITOT 0.5 05/09/2012 1321   BILITOT 0.60 09/21/2011 0902       RADIOGRAPHIC STUDIES: Ct Chest W Contrast  03/09/2012  *RADIOLOGY REPORT*  Clinical Data:  Follow-up metastatic lung carcinoma. Ongoing chemotherapy.  CT CHEST, ABDOMEN AND PELVIS WITH CONTRAST  Technique:  Multidetector CT imaging of the chest, abdomen and pelvis was performed following the standard protocol during bolus administration of intravenous contrast.  Contrast: OMNIPAQUE IOHEXOL 300 MG/ML  SOLN  Comparison:  12/16/2011  CT CHEST  Findings:  Chronic  right lung collapse with associated mediastinal shift is stable in appearance.  Hyperinflated left lung remains clear.  No suspicious pulmonary nodules or masses are identified. No evidence of pulmonary infiltrate.  No evidence of pleural or pericardial effusion.  There is no evidence of mediastinal or hilar masses.  No adenopathy seen elsewhere within the thorax. Sclerotic bone metastases are stable in appearance.  IMPRESSION:  1.  Stable appearance of sclerotic bone metastases. 2.  Chronic right lung collapse.  No  evidence of soft tissue recurrence or soft tissue metastatic disease.  CT ABDOMEN AND PELVIS  Findings:  Tiny sub-centimeter low attenuation lesion in the posterior right hepatic lobe is stable.  No other liver lesions are identified.  Both adrenal glands are normal in appearance as well as the other abdominal parenchymal organs.  Gallbladder is unremarkable.  No evidence of hydronephrosis.  No soft tissue masses or lymphadenopathy seen elsewhere within the abdomen or pelvis.  Prior hysterectomy noted.  No evidence of inflammatory process or ascites.  No evidence of bowel wall thickening or dilatation.  Sclerotic bone metastases involving the lumbar spine and pelvis are stable in appearance.  IMPRESSION:  1.  Stable sclerotic bone metastases. 2.  No evidence of soft tissue metastatic disease within the abdomen or pelvis.  Original Report Authenticated By: Danae Orleans, M.D.   Ct Abdomen Pelvis W Contrast  03/09/2012  *RADIOLOGY REPORT*  Clinical Data:  Follow-up metastatic lung carcinoma. Ongoing chemotherapy.  CT CHEST, ABDOMEN AND PELVIS WITH CONTRAST  Technique:  Multidetector CT imaging of the chest, abdomen and pelvis was performed following the standard protocol during bolus administration of intravenous contrast.  Contrast: OMNIPAQUE IOHEXOL 300 MG/ML  SOLN  Comparison:  12/16/2011  CT CHEST  Findings:  Chronic right lung collapse with associated mediastinal shift is stable in appearance.   Hyperinflated left lung remains clear.  No suspicious pulmonary nodules or masses are identified. No evidence of pulmonary infiltrate.  No evidence of pleural or pericardial effusion.  There is no evidence of mediastinal or hilar masses.  No adenopathy seen elsewhere within the thorax. Sclerotic bone metastases are stable in appearance.  IMPRESSION:  1.  Stable appearance of sclerotic bone metastases. 2.  Chronic right lung collapse.  No evidence of soft tissue recurrence or soft tissue metastatic disease.  CT ABDOMEN AND PELVIS  Findings:  Tiny sub-centimeter low attenuation lesion in the posterior right hepatic lobe is stable.  No other liver lesions are identified.  Both adrenal glands are normal in appearance as well as the other abdominal parenchymal organs.  Gallbladder is unremarkable.  No evidence of hydronephrosis.  No soft tissue masses or lymphadenopathy seen elsewhere within the abdomen or pelvis.  Prior hysterectomy noted.  No evidence of inflammatory process or ascites.  No evidence of bowel wall thickening or dilatation.  Sclerotic bone metastases involving the lumbar spine and pelvis are stable in appearance.  IMPRESSION:  1.  Stable sclerotic bone metastases. 2.  No evidence of soft tissue metastatic disease within the abdomen or pelvis.  Original Report Authenticated By: Danae Orleans, M.D.    ASSESSMENT/PLAN: This is a very pleasant 61 years old white female with metastatic non-small cell lung cancer, adenocarcinoma with positive EGFR mutation, currently on treatment with Tarceva status post 31 months of treatment. The patient is doing fine and tolerating her treatment with Tarceva fairly well with no significant adverse effects. She has no evidence for disease progression on his recent scan. The patient was discussed with [name]. She will continue on Tarceva 150 mg by mouth daily. We will decrease her MS Contin to 15 mg by mouth every 12 hours and wean from there. She may continue taking  the Vicodin or Tylenol as needed for breakthrough pain. She'll follow up with [name] in one month for another symptom management visit with a repeat CBC differential and C. Met, and CT of the chest, abdomen and pelvis with contrast to re-evaluate her disease. She will continue with  her monthly Xgeva injections for bone metastasis.  Laural Benes, Nethan Caudillo E, PA-C   All questions were answered. The patient knows to call the clinic with any problems, questions or concerns. We can certainly see the patient much sooner if necessary.  I spent 20 minutes counseling the patient face to face. The total time spent in the appointment was 30 minutes.

## 2012-05-21 ENCOUNTER — Other Ambulatory Visit: Payer: Self-pay | Admitting: *Deleted

## 2012-05-21 DIAGNOSIS — C3490 Malignant neoplasm of unspecified part of unspecified bronchus or lung: Secondary | ICD-10-CM

## 2012-05-21 MED ORDER — HYDROCODONE-ACETAMINOPHEN 5-500 MG PO TABS: ORAL_TABLET | ORAL | Status: DC

## 2012-06-01 ENCOUNTER — Ambulatory Visit (HOSPITAL_COMMUNITY)
Admission: RE | Admit: 2012-06-01 | Discharge: 2012-06-01 | Disposition: A | Discharge status: Home / Self Care | Payer: Managed Care, Other (non HMO) | Source: Ambulatory Visit | Attending: Physician Assistant | Admitting: Physician Assistant

## 2012-06-01 DIAGNOSIS — C3490 Malignant neoplasm of unspecified part of unspecified bronchus or lung: Secondary | ICD-10-CM | POA: Insufficient documentation

## 2012-06-01 DIAGNOSIS — C7951 Secondary malignant neoplasm of bone: Secondary | ICD-10-CM | POA: Insufficient documentation

## 2012-06-06 ENCOUNTER — Ambulatory Visit (HOSPITAL_BASED_OUTPATIENT_CLINIC_OR_DEPARTMENT_OTHER): Payer: Managed Care, Other (non HMO) | Admitting: Internal Medicine

## 2012-06-06 ENCOUNTER — Ambulatory Visit (HOSPITAL_BASED_OUTPATIENT_CLINIC_OR_DEPARTMENT_OTHER): Payer: Managed Care, Other (non HMO)

## 2012-06-06 ENCOUNTER — Other Ambulatory Visit (HOSPITAL_BASED_OUTPATIENT_CLINIC_OR_DEPARTMENT_OTHER): Payer: Managed Care, Other (non HMO) | Admitting: Lab

## 2012-06-06 VITALS — BP 138/88 | HR 117 | Temp 97.0°F | Resp 20 | Ht 64.0 in | Wt 152.1 lb

## 2012-06-06 DIAGNOSIS — C3490 Malignant neoplasm of unspecified part of unspecified bronchus or lung: Secondary | ICD-10-CM

## 2012-06-06 DIAGNOSIS — C3430 Malignant neoplasm of lower lobe, unspecified bronchus or lung: Secondary | ICD-10-CM

## 2012-06-06 DIAGNOSIS — C7951 Secondary malignant neoplasm of bone: Secondary | ICD-10-CM

## 2012-06-06 DIAGNOSIS — C7952 Secondary malignant neoplasm of bone marrow: Secondary | ICD-10-CM

## 2012-06-06 LAB — CBC WITH DIFFERENTIAL/PLATELET
BASO%: 0.2 % (ref 0.0–2.0)
Basophils Absolute: 0 10*3/uL (ref 0.0–0.1)
EOS%: 0.9 % (ref 0.0–7.0)
HGB: 11.3 g/dL — ABNORMAL LOW (ref 11.6–15.9)
MCH: 29.7 pg (ref 25.1–34.0)
MCHC: 33.2 g/dL (ref 31.5–36.0)
RDW: 12.5 % (ref 11.2–14.5)
lymph#: 0.8 10*3/uL — ABNORMAL LOW (ref 0.9–3.3)

## 2012-06-06 LAB — COMPREHENSIVE METABOLIC PANEL (CC13)
ALT: 17 U/L (ref 0–55)
AST: 21 U/L (ref 5–34)
Albumin: 3.4 g/dL — ABNORMAL LOW (ref 3.5–5.0)
Chloride: 106 mEq/L (ref 98–107)
Potassium: 4.3 mEq/L (ref 3.5–5.1)
Sodium: 140 mEq/L (ref 136–145)

## 2012-06-06 LAB — CORRECTED CALCIUM (CC13): Calcium, Corrected: 9.9 mg/dL (ref 8.4–10.4)

## 2012-06-06 MED ORDER — DENOSUMAB 120 MG/1.7ML ~~LOC~~ SOLN
120.0000 mg | Freq: Once | SUBCUTANEOUS | Status: AC
  Administered 2012-06-06: 120 mg via SUBCUTANEOUS
  Filled 2012-06-06: qty 1.7

## 2012-06-06 NOTE — Patient Instructions (Signed)
Your scan is stable except for slightly larger nodule in the collapsed right lower lobe of the lung. Continue Tarceva at the current dose Continue Xgeva monthly Followup in one month

## 2012-06-06 NOTE — Progress Notes (Signed)
[facility] Telephone:(336) [phone]   Fax:(336) [phone]  OFFICE PROGRESS NOTE  Makayla Pounds, MD [address] [facility], [place]. [place] [address]  DIAGNOSIS: Metastatic non-small cell lung cancer adenocarcinoma, diagnosed in December 2010 in a patient with a remote history of smoking and positive EGFR mutation.   PRIOR THERAPY:  1. Status post palliative radiotherapy to the right hilum as well as anterior inferior ribs and lumbar spine from L2-L5 under the care Dr. Shon Baton.  2. status post right hip prophylactic trochanteric femoral nail and under the care of Dr. Eulas Post  3. palliative radiotherapy to his lumbar spine under the care of Dr.Wentworth   CURRENT THERAPY:  1. Tarceva at 150 mg by mouth daily status post 32 months of treatment  2. Xgeva 120 mg subcutaneously given on a monthly basis for bone metastasis status post 10 treatments    INTERVAL HISTORY: Makayla [name] 61 y.o. female returns to the clinic today for followup visit accompanied by her sister and her son. The patient is feeling fine and tolerating her treatment with Tarceva fairly well. She denied having any significant chest pain but continues to have shortness breath with exertion, no cough or hemoptysis. The patient denied having any significant weight loss or night sweats. She continues to have mild right hip pain. She is currently on MS Contin 15 mg by mouth every 12 hours in addition to Vicodin for breakthrough pain. The patient has repeat CT scan of the chest, abdomen and pelvis performed recently and she is here for evaluation and discussion of her scan results.  MEDICAL HISTORY: Past Medical History  Diagnosis Date  . COPD (chronic obstructive pulmonary disease)   . Lung cancer     lung ca dx 12/10  . Lung cancer 09/01/2011    bone mets    ALLERGIES:  is allergic to sulfonamide derivatives.  MEDICATIONS:  Current Outpatient Prescriptions  Medication  Sig Dispense Refill  . ALPRAZolam (XANAX) 0.5 MG tablet Take 0.5 mg by mouth as needed.        . calcium-vitamin D (OSCAL WITH D) 500-200 MG-UNIT per tablet Take 1 tablet by mouth daily.        . clindamycin (CLEOCIN T) 1 % lotion Apply topically 2 (two) times daily.        Marland Kitchen denosumab (XGEVA) 120 MG/1.7ML SOLN Inject 120 mg into the skin once.      . diphenhydramine-acetaminophen (TYLENOL PM) 25-500 MG TABS Take 1 tablet by mouth at bedtime as needed.      . docusate sodium (COLACE) 100 MG capsule Take 100 mg by mouth as needed.        . ferrous sulfate 325 (65 FE) MG tablet Take 325 mg by mouth 2 (two) times daily.        Marland Kitchen HYDROcodone-acetaminophen (VICODIN) 5-500 MG per tablet Take 1 tablet by mouth every 4-6 hrs prn breakthrough pain  60 tablet  0  . Magnesium 250 MG TABS Take 1 tablet by mouth daily.      Marland Kitchen morphine (MS CONTIN) 15 MG 12 hr tablet Take 1 tablet (15 mg total) by mouth 2 (two) times daily.  60 tablet  0  . Cholecalciferol (VITAMIN D-3 PO) Take 1,000 Units by mouth 2 (two) times daily.        Marland Kitchen erlotinib (TARCEVA) 150 MG tablet Take 1 tablet (150 mg total) by mouth daily. Take on an empty stomach 1 hour before meals  or 2 hours after.  30 tablet  1  . methocarbamol (ROBAXIN) 500 MG tablet Take 500 mg by mouth 4 (four) times daily.        . prochlorperazine (COMPAZINE) 10 MG tablet       . DISCONTD: warfarin (COUMADIN) 5 MG tablet Take 5 mg by mouth daily.         No current facility-administered medications for this visit.   Facility-Administered Medications Ordered in Other Visits  Medication Dose Route Frequency Provider Last Rate Last Dose  . denosumab (XGEVA) injection 120 mg  120 mg Subcutaneous Once [name], MD   120 mg at 06/06/12 1533    SURGICAL HISTORY:  Past Surgical History  Procedure Date  . Abdominal hysterectomy   . Surgery right leg, rod and pin     REVIEW OF SYSTEMS:  A comprehensive review of systems was negative except for: Respiratory:  positive for dyspnea on exertion Musculoskeletal: positive for bone pain   PHYSICAL EXAMINATION: General appearance: alert, cooperative and no distress Head: Normocephalic, without obvious abnormality, atraumatic Neck: no adenopathy Lymph nodes: Cervical, supraclavicular, and axillary nodes normal. Resp: clear to auscultation bilaterally Cardio: regular rate and rhythm, S1, S2 normal, no murmur, click, rub or gallop GI: soft, non-tender; bowel sounds normal; no masses,  no organomegaly Extremities: extremities normal, atraumatic, no cyanosis or edema  ECOG PERFORMANCE STATUS: 1 - Symptomatic but completely ambulatory  Blood pressure 138/88, pulse 117, temperature 97 F (36.1 C), temperature source Oral, resp. rate 20, height 5\' 4"  (1.626 m), weight 152 lb 1.6 oz (68.992 kg).  LABORATORY DATA: Lab Results  Component Value Date   WBC 4.3 06/06/2012   HGB 11.3* 06/06/2012   HCT 34.0* 06/06/2012   MCV 89.2 06/06/2012   PLT 244 06/06/2012      Chemistry      Component Value Date/Time   NA 140 06/06/2012 1446   NA 136 05/09/2012 1321   NA 137 09/21/2011 0902   K 4.3 06/06/2012 1446   K 4.3 05/09/2012 1321   K 4.2 09/21/2011 0902   CL 106 06/06/2012 1446   CL 101 05/09/2012 1321   CL 101 09/21/2011 0902   CO2 24 06/06/2012 1446   CO2 24 05/09/2012 1321   CO2 28 09/21/2011 0902   BUN 14.0 06/06/2012 1446   BUN 12 05/09/2012 1321   BUN 16 09/21/2011 0902   CREATININE 0.8 06/06/2012 1446   CREATININE 0.72 05/09/2012 1321   CREATININE 1.0 09/21/2011 0902      Component Value Date/Time   CALCIUM 9.0 05/09/2012 1321   CALCIUM 8.7 09/21/2011 0902   ALKPHOS 136 06/06/2012 1446   ALKPHOS 144* 05/09/2012 1321   ALKPHOS 130* 09/21/2011 0902   AST 21 06/06/2012 1446   AST 22 05/09/2012 1321   AST 27 09/21/2011 0902   ALT 17 06/06/2012 1446   ALT 16 05/09/2012 1321   BILITOT 0.50 06/06/2012 1446   BILITOT 0.5 05/09/2012 1321   BILITOT 0.60 09/21/2011 0902       RADIOGRAPHIC STUDIES: Ct Chest W Contrast  06/01/2012   *RADIOLOGY REPORT*  Clinical Data:  Restaging lung cancer.  Bone metastasis.  CT CHEST, ABDOMEN AND PELVIS WITH CONTRAST  Technique:  Multidetector CT imaging of the chest, abdomen and pelvis was performed following the standard protocol during bolus administration of intravenous contrast.  Contrast:  100 ml Omnipaque 300  Comparison:  CT 03/09/2012  CT CHEST  Findings:  No axillary lymphadenopathy.  No supraclavicular lymphadenopathy.  There  is a high a right paratracheal lymph node measuring 7 mm (image #12) which may be increased from 6 mm on prior.  Post partial right pneumonectomy.  In the atelectatic remaining right lower lobe there is an enhancing lesion measuring 21 x 19 mm (image 27) which is increased in size from 17 x 13 mm on prior.  This is concerning for lung cancer recurrence.  Mediastinum shifted into the  right hemithorax.  No aerated right lung is remaining.  In the hyperexpanded left lung, there is no suspicious pulmonary nodules.  IMPRESSION: 1.  Concern for lung cancer recurrence within the atelectatic right lower lobe. 2.  Small right paratracheal lymph node is more prominent than prior.  Recommend attention on follow-up versus FDG PET scan.  CT ABDOMEN AND PELVIS  Findings:  No focal hepatic lesion.  The gallbladder, pancreas, spleen, adrenal glands, kidneys are normal.  The stomach, small bowel, and colon are normal.  Abdominal aorta normal caliber.  No retroperitoneal periportal lymphadenopathy.  No free fluid the pelvis.  The bladder is normal.  Post hysterectomy.  Internal fixation of right hip is noted.  There is a dense sclerotic lesion within the right femur, pelvis, lower spine not changed from prior.  Sclerotic lesions are also present within the cervical spine and left humerus.  IMPRESSION:  1.  No evidence metastasis in the abdomen or pelvis.  2.   Stable sclerotic bone metastasis.   Original Report Authenticated By: Genevive Bi, M.D.    Ct Abdomen Pelvis W  Contrast  06/01/2012  *RADIOLOGY REPORT*  Clinical Data:  Restaging lung cancer.  Bone metastasis.  CT CHEST, ABDOMEN AND PELVIS WITH CONTRAST  Technique:  Multidetector CT imaging of the chest, abdomen and pelvis was performed following the standard protocol during bolus administration of intravenous contrast.  Contrast:  100 ml Omnipaque 300  Comparison:  CT 03/09/2012  CT CHEST  Findings:  No axillary lymphadenopathy.  No supraclavicular lymphadenopathy.  There is a high a right paratracheal lymph node measuring 7 mm (image #12) which may be increased from 6 mm on prior.  Post partial right pneumonectomy.  In the atelectatic remaining right lower lobe there is an enhancing lesion measuring 21 x 19 mm (image 27) which is increased in size from 17 x 13 mm on prior.  This is concerning for lung cancer recurrence.  Mediastinum shifted into the  right hemithorax.  No aerated right lung is remaining.  In the hyperexpanded left lung, there is no suspicious pulmonary nodules.  IMPRESSION: 1.  Concern for lung cancer recurrence within the atelectatic right lower lobe. 2.  Small right paratracheal lymph node is more prominent than prior.  Recommend attention on follow-up versus FDG PET scan.  CT ABDOMEN AND PELVIS  Findings:  No focal hepatic lesion.  The gallbladder, pancreas, spleen, adrenal glands, kidneys are normal.  The stomach, small bowel, and colon are normal.  Abdominal aorta normal caliber.  No retroperitoneal periportal lymphadenopathy.  No free fluid the pelvis.  The bladder is normal.  Post hysterectomy.  Internal fixation of right hip is noted.  There is a dense sclerotic lesion within the right femur, pelvis, lower spine not changed from prior.  Sclerotic lesions are also present within the cervical spine and left humerus.  IMPRESSION:  1.  No evidence metastasis in the abdomen or pelvis.  2.   Stable sclerotic bone metastasis.   Original Report Authenticated By: Genevive Bi, M.D.     ASSESSMENT:  This is a  very pleasant 61 years old white female with metastatic non-small cell lung cancer, adenocarcinoma with positive EGFR mutation. She is currently on treatment with Tarceva 150 mg by mouth daily for the last 32 months with no significant evidence for disease progression except for questionable lung cancer recurrence in the atelectatic right lower lobe but this lung is nonfunctional at this point.  PLAN: I discussed the scan results with the patient and her family. I recommended for her to continue the treatment was Tarceva at the same dose for now. She would come back for followup visit in one month's for reevaluation and management any adverse effect of her treatment. If the patient continues to have evidence for disease progression, I may consider her for treatment change in her to chemotherapy or addition of Avastin to her Tarceva. She would continue treatment with Xgeva monthly basis for the bone metastasis. The patient and her family agreed to the current plan.  All questions were answered. The patient knows to call the clinic with any problems, questions or concerns. We can certainly see the patient much sooner if necessary.  I spent 15 minutes counseling the patient face to face. The total time spent in the appointment was 25 minutes.

## 2012-06-07 ENCOUNTER — Telehealth: Payer: Self-pay | Admitting: *Deleted

## 2012-06-07 DIAGNOSIS — C3490 Malignant neoplasm of unspecified part of unspecified bronchus or lung: Secondary | ICD-10-CM

## 2012-06-07 MED ORDER — MORPHINE SULFATE ER 15 MG PO TBCR: 15.0000 mg | EXTENDED_RELEASE_TABLET | Freq: Two times a day (BID) | ORAL | Status: DC

## 2012-06-07 NOTE — Telephone Encounter (Signed)
Pt requesting refill for morphine

## 2012-06-11 ENCOUNTER — Other Ambulatory Visit: Payer: Self-pay | Admitting: *Deleted

## 2012-06-11 DIAGNOSIS — C3490 Malignant neoplasm of unspecified part of unspecified bronchus or lung: Secondary | ICD-10-CM

## 2012-06-11 MED ORDER — HYDROCODONE-ACETAMINOPHEN 5-500 MG PO TABS: ORAL_TABLET | ORAL | Status: DC

## 2012-06-27 ENCOUNTER — Other Ambulatory Visit: Payer: Self-pay | Admitting: Internal Medicine

## 2012-06-28 ENCOUNTER — Other Ambulatory Visit: Payer: Self-pay | Admitting: Internal Medicine

## 2012-06-29 ENCOUNTER — Telehealth: Payer: Self-pay | Admitting: Internal Medicine

## 2012-06-29 ENCOUNTER — Other Ambulatory Visit: Payer: Self-pay

## 2012-06-29 DIAGNOSIS — C3490 Malignant neoplasm of unspecified part of unspecified bronchus or lung: Secondary | ICD-10-CM

## 2012-06-29 MED ORDER — HYDROCODONE-ACETAMINOPHEN 5-500 MG PO TABS: ORAL_TABLET | ORAL | Status: DC

## 2012-06-29 NOTE — Telephone Encounter (Signed)
Called pt with appt time per aj to move up appt in the day    aom

## 2012-07-05 ENCOUNTER — Ambulatory Visit: Payer: Managed Care, Other (non HMO)

## 2012-07-05 ENCOUNTER — Other Ambulatory Visit: Payer: Managed Care, Other (non HMO) | Admitting: Lab

## 2012-07-05 ENCOUNTER — Encounter: Payer: Self-pay | Admitting: Physician Assistant

## 2012-07-05 ENCOUNTER — Ambulatory Visit (HOSPITAL_BASED_OUTPATIENT_CLINIC_OR_DEPARTMENT_OTHER): Payer: Managed Care, Other (non HMO)

## 2012-07-05 ENCOUNTER — Ambulatory Visit (HOSPITAL_BASED_OUTPATIENT_CLINIC_OR_DEPARTMENT_OTHER): Payer: Managed Care, Other (non HMO) | Admitting: Physician Assistant

## 2012-07-05 ENCOUNTER — Ambulatory Visit: Payer: Managed Care, Other (non HMO) | Admitting: Physician Assistant

## 2012-07-05 ENCOUNTER — Other Ambulatory Visit (HOSPITAL_BASED_OUTPATIENT_CLINIC_OR_DEPARTMENT_OTHER): Payer: Managed Care, Other (non HMO) | Admitting: Lab

## 2012-07-05 ENCOUNTER — Telehealth: Payer: Self-pay | Admitting: Internal Medicine

## 2012-07-05 VITALS — BP 119/77 | HR 70 | Temp 97.0°F | Resp 20 | Ht 64.0 in | Wt 155.3 lb

## 2012-07-05 DIAGNOSIS — C3430 Malignant neoplasm of lower lobe, unspecified bronchus or lung: Secondary | ICD-10-CM

## 2012-07-05 DIAGNOSIS — M79609 Pain in unspecified limb: Secondary | ICD-10-CM

## 2012-07-05 DIAGNOSIS — C7951 Secondary malignant neoplasm of bone: Secondary | ICD-10-CM

## 2012-07-05 DIAGNOSIS — C3490 Malignant neoplasm of unspecified part of unspecified bronchus or lung: Secondary | ICD-10-CM

## 2012-07-05 LAB — CBC WITH DIFFERENTIAL/PLATELET
BASO%: 0.4 % (ref 0.0–2.0)
LYMPH%: 16.9 % (ref 14.0–49.7)
MCHC: 34.4 g/dL (ref 31.5–36.0)
MONO#: 0.4 10*3/uL (ref 0.1–0.9)
Platelets: 245 10*3/uL (ref 145–400)
RBC: 4.06 10*6/uL (ref 3.70–5.45)
WBC: 4.2 10*3/uL (ref 3.9–10.3)

## 2012-07-05 LAB — COMPREHENSIVE METABOLIC PANEL (CC13)
ALT: 19 U/L (ref 0–55)
Alkaline Phosphatase: 136 U/L (ref 40–150)
CO2: 22 mEq/L (ref 22–29)
Sodium: 138 mEq/L (ref 136–145)
Total Bilirubin: 0.5 mg/dL (ref 0.20–1.20)
Total Protein: 6.6 g/dL (ref 6.4–8.3)

## 2012-07-05 MED ORDER — DENOSUMAB 120 MG/1.7ML ~~LOC~~ SOLN
120.0000 mg | Freq: Once | SUBCUTANEOUS | Status: AC
  Administered 2012-07-05: 120 mg via SUBCUTANEOUS
  Filled 2012-07-05: qty 1.7

## 2012-07-05 MED ORDER — MORPHINE SULFATE ER 15 MG PO TBCR: 15.0000 mg | EXTENDED_RELEASE_TABLET | Freq: Two times a day (BID) | ORAL | Status: DC

## 2012-07-05 NOTE — Telephone Encounter (Signed)
appts made and printed for pt aom °

## 2012-07-05 NOTE — Patient Instructions (Addendum)
Continue on Tarceva at 150 mg by mouth daily Follow up in one month

## 2012-07-07 NOTE — Progress Notes (Signed)
[facility] [phone]   Fax:(336) [phone]  OFFICE PROGRESS NOTE  [name], MD [address] [facility], [place]. [place] [address]  DIAGNOSIS: Metastatic non-small cell lung cancer adenocarcinoma, diagnosed in December 2010 in a patient with a remote history of smoking and positive EGFR mutation.   PRIOR THERAPY:  1. Status post palliative radiotherapy to the right hilum as well as anterior inferior ribs and lumbar spine from L2-L5 under the care Dr. Shon Baton.  2. status post right hip prophylactic trochanteric femoral nail and under the care of Dr. Eulas Post  3. palliative radiotherapy to his lumbar spine under the care of Dr.Wentworth   CURRENT THERAPY:  1. Tarceva at 150 mg by mouth daily status post 33 months of treatment  2. Xgeva 120 mg subcutaneously given on a monthly basis for bone metastasis status post 11 treatments    INTERVAL HISTORY: Makayla [name] 61 y.o. female returns to the clinic today for followup visit accompanied by her sister. The patient is feeling fine and tolerating her treatment with Tarceva fairly well. She denied having any significant chest pain but continues to have shortness breath with exertion, no cough or hemoptysis. The patient denied having any significant weight loss or night sweats. She continues to have mild right hip pain. She is currently on MS Contin 15 mg by mouth every 12 hours in addition to Vicodin for breakthrough pain. She is tolerating the decreased dose of MS Contin well. She requests a refill for this medication. She is also having left upper arm pain. She reports that she is only using the Vicodin about twice a day. MEDICAL HISTORY: Past Medical History  Diagnosis Date  . COPD (chronic obstructive pulmonary disease)   . Lung cancer     lung ca dx 12/10  . Lung cancer 09/01/2011    bone mets    ALLERGIES:  is allergic to sulfonamide derivatives.  MEDICATIONS:  Current  Outpatient Prescriptions  Medication Sig Dispense Refill  . ALPRAZolam (XANAX) 0.5 MG tablet Take 0.5 mg by mouth as needed.        . calcium-vitamin D (OSCAL WITH D) 500-200 MG-UNIT per tablet Take 1 tablet by mouth daily.        . Cholecalciferol (VITAMIN D-3 PO) Take 1,000 Units by mouth 2 (two) times daily.        . clindamycin (CLEOCIN T) 1 % lotion Apply topically 2 (two) times daily.        Marland Kitchen denosumab (XGEVA) 120 MG/1.7ML SOLN Inject 120 mg into the skin once.      . diphenhydramine-acetaminophen (TYLENOL PM) 25-500 MG TABS Take 1 tablet by mouth at bedtime as needed.      . docusate sodium (COLACE) 100 MG capsule Take 100 mg by mouth as needed.        . erlotinib (TARCEVA) 150 MG tablet Take 1 tablet (150 mg total) by mouth daily. Take on an empty stomach 1 hour before meals or 2 hours after.  30 tablet  1  . ferrous sulfate 325 (65 FE) MG tablet Take 325 mg by mouth 2 (two) times daily.        Marland Kitchen HYDROcodone-acetaminophen (VICODIN) 5-500 MG per tablet TAKE 1 TABLET BY MOUTH EVERY 4-6 HOURS AS NEEDED FOR BREAKTHROUGH PAIN  60 tablet  0  . Magnesium 250 MG TABS Take 1 tablet by mouth daily.      . methocarbamol (ROBAXIN) 500 MG tablet Take  500 mg by mouth 4 (four) times daily.        Marland Kitchen morphine (MS CONTIN) 15 MG 12 hr tablet Take 1 tablet (15 mg total) by mouth 2 (two) times daily.  60 tablet  0  . prochlorperazine (COMPAZINE) 10 MG tablet       . DISCONTD: warfarin (COUMADIN) 5 MG tablet Take 5 mg by mouth daily.          SURGICAL HISTORY:  Past Surgical History  Procedure Date  . Abdominal hysterectomy   . Surgery right leg, rod and pin     REVIEW OF SYSTEMS:  A comprehensive review of systems was negative except for: Respiratory: positive for dyspnea on exertion Musculoskeletal: positive for bone pain   PHYSICAL EXAMINATION: General appearance: alert, cooperative and no distress Head: Normocephalic, without obvious abnormality, atraumatic Neck: no adenopathy Lymph nodes:  Cervical, supraclavicular, and axillary nodes normal. Resp: clear to auscultation bilaterally Cardio: regular rate and rhythm, S1, S2 normal, no murmur, click, rub or gallop GI: soft, non-tender; bowel sounds normal; no masses,  no organomegaly Extremities: extremities normal, atraumatic, no cyanosis or edema  ECOG PERFORMANCE STATUS: 1 - Symptomatic but completely ambulatory  Blood pressure 119/77, pulse 70, temperature 97 F (36.1 C), temperature source Oral, resp. rate 20, height 5\' 4"  (1.626 m), weight 155 lb 4.8 oz (70.444 kg).  LABORATORY DATA: Lab Results  Component Value Date   WBC 4.2 07/05/2012   HGB 12.7 07/05/2012   HCT 36.8 07/05/2012   MCV 90.7 07/05/2012   PLT 245 07/05/2012      Chemistry      Component Value Date/Time   NA 138 07/05/2012 1016   NA 136 05/09/2012 1321   NA 137 09/21/2011 0902   K 4.2 07/05/2012 1016   K 4.3 05/09/2012 1321   K 4.2 09/21/2011 0902   CL 105 07/05/2012 1016   CL 101 05/09/2012 1321   CL 101 09/21/2011 0902   CO2 22 07/05/2012 1016   CO2 24 05/09/2012 1321   CO2 28 09/21/2011 0902   BUN 15.0 07/05/2012 1016   BUN 12 05/09/2012 1321   BUN 16 09/21/2011 0902   CREATININE 0.9 07/05/2012 1016   CREATININE 0.72 05/09/2012 1321   CREATININE 1.0 09/21/2011 0902      Component Value Date/Time   CALCIUM 8.8 07/05/2012 1016   CALCIUM 9.0 05/09/2012 1321   CALCIUM 8.7 09/21/2011 0902   ALKPHOS 136 07/05/2012 1016   ALKPHOS 144* 05/09/2012 1321   ALKPHOS 130* 09/21/2011 0902   AST 24 07/05/2012 1016   AST 22 05/09/2012 1321   AST 27 09/21/2011 0902   ALT 19 07/05/2012 1016   ALT 16 05/09/2012 1321   BILITOT 0.50 07/05/2012 1016   BILITOT 0.5 05/09/2012 1321   BILITOT 0.60 09/21/2011 0902       RADIOGRAPHIC STUDIES: Ct Chest W Contrast  06/01/2012  *RADIOLOGY REPORT*  Clinical Data:  Restaging lung cancer.  Bone metastasis.  CT CHEST, ABDOMEN AND PELVIS WITH CONTRAST  Technique:  Multidetector CT imaging of the chest, abdomen and pelvis was performed  following the standard protocol during bolus administration of intravenous contrast.  Contrast:  100 ml Omnipaque 300  Comparison:  CT 03/09/2012  CT CHEST  Findings:  No axillary lymphadenopathy.  No supraclavicular lymphadenopathy.  There is a high a right paratracheal lymph node measuring 7 mm (image #12) which may be increased from 6 mm on prior.  Post partial right pneumonectomy.  In the atelectatic remaining right  lower lobe there is an enhancing lesion measuring 21 x 19 mm (image 27) which is increased in size from 17 x 13 mm on prior.  This is concerning for lung cancer recurrence.  Mediastinum shifted into the  right hemithorax.  No aerated right lung is remaining.  In the hyperexpanded left lung, there is no suspicious pulmonary nodules.  IMPRESSION: 1.  Concern for lung cancer recurrence within the atelectatic right lower lobe. 2.  Small right paratracheal lymph node is more prominent than prior.  Recommend attention on follow-up versus FDG PET scan.  CT ABDOMEN AND PELVIS  Findings:  No focal hepatic lesion.  The gallbladder, pancreas, spleen, adrenal glands, kidneys are normal.  The stomach, small bowel, and colon are normal.  Abdominal aorta normal caliber.  No retroperitoneal periportal lymphadenopathy.  No free fluid the pelvis.  The bladder is normal.  Post hysterectomy.  Internal fixation of right hip is noted.  There is a dense sclerotic lesion within the right femur, pelvis, lower spine not changed from prior.  Sclerotic lesions are also present within the cervical spine and left humerus.  IMPRESSION:  1.  No evidence metastasis in the abdomen or pelvis.  2.   Stable sclerotic bone metastasis.   Original Report Authenticated By: Genevive Bi, M.D.    Ct Abdomen Pelvis W Contrast  06/01/2012  *RADIOLOGY REPORT*  Clinical Data:  Restaging lung cancer.  Bone metastasis.  CT CHEST, ABDOMEN AND PELVIS WITH CONTRAST  Technique:  Multidetector CT imaging of the chest, abdomen and pelvis was  performed following the standard protocol during bolus administration of intravenous contrast.  Contrast:  100 ml Omnipaque 300  Comparison:  CT 03/09/2012  CT CHEST  Findings:  No axillary lymphadenopathy.  No supraclavicular lymphadenopathy.  There is a high a right paratracheal lymph node measuring 7 mm (image #12) which may be increased from 6 mm on prior.  Post partial right pneumonectomy.  In the atelectatic remaining right lower lobe there is an enhancing lesion measuring 21 x 19 mm (image 27) which is increased in size from 17 x 13 mm on prior.  This is concerning for lung cancer recurrence.  Mediastinum shifted into the  right hemithorax.  No aerated right lung is remaining.  In the hyperexpanded left lung, there is no suspicious pulmonary nodules.  IMPRESSION: 1.  Concern for lung cancer recurrence within the atelectatic right lower lobe. 2.  Small right paratracheal lymph node is more prominent than prior.  Recommend attention on follow-up versus FDG PET scan.  CT ABDOMEN AND PELVIS  Findings:  No focal hepatic lesion.  The gallbladder, pancreas, spleen, adrenal glands, kidneys are normal.  The stomach, small bowel, and colon are normal.  Abdominal aorta normal caliber.  No retroperitoneal periportal lymphadenopathy.  No free fluid the pelvis.  The bladder is normal.  Post hysterectomy.  Internal fixation of right hip is noted.  There is a dense sclerotic lesion within the right femur, pelvis, lower spine not changed from prior.  Sclerotic lesions are also present within the cervical spine and left humerus.  IMPRESSION:  1.  No evidence metastasis in the abdomen or pelvis.  2.   Stable sclerotic bone metastasis.   Original Report Authenticated By: Genevive Bi, M.D.     ASSESSMENT/PLAN: This is a very pleasant 61 years old white female with metastatic non-small cell lung cancer, adenocarcinoma with positive EGFR mutation. She is currently on treatment with Tarceva 150 mg by mouth daily for the last  33 months with no significant evidence for disease progression except for questionable lung cancer recurrence in the atelectatic right lower lobe but this lung is nonfunctional at this point. The patient was discussed with Dr. Clelia Croft in Dr. Asa Lente absence. She will continue on Tarceva at the current dose. She is give a prescription for MS Contin 15 mg, a total of 60 tablets with no refill. She will receive her Xgeva injection today. She will return in one month with a repeat CBC differential, CMET and her Xgeva injection.We will monitor her left arm pain.  Laural Benes, Dareion Kneece E, PA-C   All questions were answered. The patient knows to call the clinic with any problems, questions or concerns. We can certainly see the patient much sooner if necessary.  I spent 20 minutes counseling the patient face to face. The total time spent in the appointment was 30 minutes.

## 2012-07-10 ENCOUNTER — Other Ambulatory Visit: Payer: Self-pay | Admitting: *Deleted

## 2012-07-10 DIAGNOSIS — C3490 Malignant neoplasm of unspecified part of unspecified bronchus or lung: Secondary | ICD-10-CM

## 2012-07-10 MED ORDER — ERLOTINIB HCL 150 MG PO TABS
150.0000 mg | ORAL_TABLET | Freq: Every day | ORAL | Status: DC
Start: 2012-07-10 — End: 2012-12-20

## 2012-07-18 ENCOUNTER — Other Ambulatory Visit: Payer: Self-pay | Admitting: Internal Medicine

## 2012-07-18 DIAGNOSIS — C3490 Malignant neoplasm of unspecified part of unspecified bronchus or lung: Secondary | ICD-10-CM

## 2012-08-02 ENCOUNTER — Ambulatory Visit (HOSPITAL_COMMUNITY)
Admission: RE | Admit: 2012-08-02 | Discharge: 2012-08-02 | Disposition: A | Discharge status: Home / Self Care | Payer: Managed Care, Other (non HMO) | Source: Ambulatory Visit | Attending: Physician Assistant | Admitting: Physician Assistant

## 2012-08-02 ENCOUNTER — Ambulatory Visit (HOSPITAL_BASED_OUTPATIENT_CLINIC_OR_DEPARTMENT_OTHER): Payer: Managed Care, Other (non HMO)

## 2012-08-02 ENCOUNTER — Telehealth: Payer: Self-pay | Admitting: Internal Medicine

## 2012-08-02 ENCOUNTER — Other Ambulatory Visit (HOSPITAL_BASED_OUTPATIENT_CLINIC_OR_DEPARTMENT_OTHER): Payer: Managed Care, Other (non HMO) | Admitting: Lab

## 2012-08-02 ENCOUNTER — Ambulatory Visit (HOSPITAL_BASED_OUTPATIENT_CLINIC_OR_DEPARTMENT_OTHER): Payer: Managed Care, Other (non HMO) | Admitting: Physician Assistant

## 2012-08-02 ENCOUNTER — Encounter: Payer: Self-pay | Admitting: Physician Assistant

## 2012-08-02 VITALS — BP 127/80 | HR 97 | Temp 96.9°F

## 2012-08-02 VITALS — BP 130/81 | HR 106 | Temp 96.7°F | Resp 20 | Ht 64.0 in | Wt 158.0 lb

## 2012-08-02 DIAGNOSIS — C3490 Malignant neoplasm of unspecified part of unspecified bronchus or lung: Secondary | ICD-10-CM | POA: Insufficient documentation

## 2012-08-02 DIAGNOSIS — C3430 Malignant neoplasm of lower lobe, unspecified bronchus or lung: Secondary | ICD-10-CM

## 2012-08-02 DIAGNOSIS — M79609 Pain in unspecified limb: Secondary | ICD-10-CM

## 2012-08-02 DIAGNOSIS — R937 Abnormal findings on diagnostic imaging of other parts of musculoskeletal system: Secondary | ICD-10-CM | POA: Insufficient documentation

## 2012-08-02 DIAGNOSIS — C7951 Secondary malignant neoplasm of bone: Secondary | ICD-10-CM

## 2012-08-02 DIAGNOSIS — C7952 Secondary malignant neoplasm of bone marrow: Secondary | ICD-10-CM

## 2012-08-02 LAB — CBC WITH DIFFERENTIAL/PLATELET
BASO%: 0.3 % (ref 0.0–2.0)
EOS%: 1.6 % (ref 0.0–7.0)
LYMPH%: 20.9 % (ref 14.0–49.7)
MCH: 31 pg (ref 25.1–34.0)
MCHC: 34.2 g/dL (ref 31.5–36.0)
MCV: 90.7 fL (ref 79.5–101.0)
MONO%: 9 % (ref 0.0–14.0)
Platelets: 224 10*3/uL (ref 145–400)
RBC: 3.94 10*6/uL (ref 3.70–5.45)
WBC: 3.8 10*3/uL — ABNORMAL LOW (ref 3.9–10.3)

## 2012-08-02 LAB — COMPREHENSIVE METABOLIC PANEL (CC13)
ALT: 21 U/L (ref 0–55)
Alkaline Phosphatase: 150 U/L (ref 40–150)
Sodium: 134 mEq/L — ABNORMAL LOW (ref 136–145)
Total Bilirubin: 0.53 mg/dL (ref 0.20–1.20)
Total Protein: 7 g/dL (ref 6.4–8.3)

## 2012-08-02 MED ORDER — MORPHINE SULFATE ER 15 MG PO TBCR: 15.0000 mg | EXTENDED_RELEASE_TABLET | Freq: Two times a day (BID) | ORAL | Status: DC

## 2012-08-02 MED ORDER — DENOSUMAB 120 MG/1.7ML ~~LOC~~ SOLN
120.0000 mg | Freq: Once | SUBCUTANEOUS | Status: AC
  Administered 2012-08-02: 120 mg via SUBCUTANEOUS
  Filled 2012-08-02: qty 1.7

## 2012-08-02 NOTE — Patient Instructions (Addendum)
Continue taking Tarceva 150 mg by mouth daily Follow up with [name] in 1 month with a restaging CT scan of your chest, abdomen and pelvis to re-evaluate your disease We will check an xray of your left arm to evaluate your complaints of pain

## 2012-08-02 NOTE — Progress Notes (Signed)
[facility] Telephone:([phone]   Fax:(336) [phone]  OFFICE PROGRESS NOTE  [name], MD [address] [facility], [place]. [place] [address]  DIAGNOSIS: Metastatic non-small cell lung cancer adenocarcinoma, diagnosed in December 2010 in a patient with a remote history of smoking and positive EGFR mutation.   PRIOR THERAPY:  1. Status post palliative radiotherapy to the right hilum as well as anterior inferior ribs and lumbar spine from L2-L5 under the care Dr. Shon Baton.  2. status post right hip prophylactic trochanteric femoral nail and under the care of Dr. Eulas Post  3. palliative radiotherapy to his lumbar spine under the care of Dr.Wentworth   CURRENT THERAPY:  1. Tarceva at 150 mg by mouth daily status post 34 months of treatment  2. Xgeva 120 mg subcutaneously given on a monthly basis for bone metastasis status post 12 treatments    INTERVAL HISTORY: Makayla [name] 61 y.o. female returns to the clinic today for followup visit accompanied by her sister and her son. The patient is feeling fine and tolerating her treatment with Tarceva fairly well. She denied having any significant chest pain but continues to have shortness breath with exertion, no cough or hemoptysis. The patient denied having any significant weight loss or night sweats. She continues to have mild right hip pain. She is currently on MS Contin 15 mg by mouth every 12 hours in addition to and average of 3 Vicodin tablets for breakthrough pain.  She requests a refill for the MS Contin. She continues to complain ofleft upper arm pain and feels it may be a bit more painful.   MEDICAL HISTORY: Past Medical History  Diagnosis Date  . COPD (chronic obstructive pulmonary disease)   . Lung cancer     lung ca dx 12/10  . Lung cancer 09/01/2011    bone mets    ALLERGIES:  is allergic to sulfonamide derivatives.  MEDICATIONS:  Current Outpatient Prescriptions    Medication Sig Dispense Refill  . ALPRAZolam (XANAX) 0.5 MG tablet Take 0.5 mg by mouth as needed.        . calcium-vitamin D (OSCAL WITH D) 500-200 MG-UNIT per tablet Take 1 tablet by mouth daily.        . Cholecalciferol (VITAMIN D-3 PO) Take 1,000 Units by mouth 2 (two) times daily.        . clindamycin (CLEOCIN T) 1 % lotion Apply topically 2 (two) times daily.        Marland Kitchen denosumab (XGEVA) 120 MG/1.7ML SOLN Inject 120 mg into the skin once.      . diphenhydramine-acetaminophen (TYLENOL PM) 25-500 MG TABS Take 1 tablet by mouth at bedtime as needed.      . docusate sodium (COLACE) 100 MG capsule Take 100 mg by mouth as needed.        . erlotinib (TARCEVA) 150 MG tablet Take 1 tablet (150 mg total) by mouth daily. Take on an empty stomach 1 hour before meals or 2 hours after.  30 tablet  0  . erlotinib (TARCEVA) 150 MG tablet Take 1 tablet (150 mg total) by mouth daily. Take on an empty stomach 1 hour before meals or 2 hours after.  30 tablet  2  . ferrous sulfate 325 (65 FE) MG tablet Take 325 mg by mouth 2 (two) times daily.        Marland Kitchen HYDROcodone-acetaminophen (VICODIN) 5-500 MG per tablet TAKE 1 TABLET BY MOUTH EVERY 4-6 HOURS AS  NEEDED FOR BREAKTHROUGH PAIN  60 tablet  0  . Magnesium 250 MG TABS Take 1 tablet by mouth daily.      . methocarbamol (ROBAXIN) 500 MG tablet Take 500 mg by mouth 4 (four) times daily.        Marland Kitchen morphine (MS CONTIN) 15 MG 12 hr tablet Take 1 tablet (15 mg total) by mouth 2 (two) times daily.  60 tablet  0  . prochlorperazine (COMPAZINE) 10 MG tablet       . DISCONTD: warfarin (COUMADIN) 5 MG tablet Take 5 mg by mouth daily.         No current facility-administered medications for this visit.   Facility-Administered Medications Ordered in Other Visits  Medication Dose Route Frequency Provider Last Rate Last Dose  . denosumab (XGEVA) injection 120 mg  120 mg Subcutaneous Once [name], MD        SURGICAL HISTORY:  Past Surgical History  Procedure Date  .  Abdominal hysterectomy   . Surgery right leg, rod and pin     REVIEW OF SYSTEMS:  A comprehensive review of systems was negative except for: Respiratory: positive for dyspnea on exertion Musculoskeletal: positive for bone pain   PHYSICAL EXAMINATION: General appearance: alert, cooperative and no distress Head: Normocephalic, without obvious abnormality, atraumatic Neck: no adenopathy Lymph nodes: Cervical, supraclavicular, and axillary nodes normal. Resp: clear to auscultation bilaterally Cardio: regular rate and rhythm, S1, S2 normal, no murmur, click, rub or gallop GI: soft, non-tender; bowel sounds normal; no masses,  no organomegaly Extremities: extremities normal, atraumatic, no cyanosis or edema  ECOG PERFORMANCE STATUS: 1 - Symptomatic but completely ambulatory  Blood pressure 130/81, pulse 106, temperature 96.7 F (35.9 C), resp. rate 20, height 5\' 4"  (1.626 m), weight 158 lb (71.668 kg).  LABORATORY DATA: Lab Results  Component Value Date   WBC 3.8* 08/02/2012   HGB 12.2 08/02/2012   HCT 35.7 08/02/2012   MCV 90.7 08/02/2012   PLT 224 08/02/2012      Chemistry      Component Value Date/Time   NA 138 07/05/2012 1016   NA 136 05/09/2012 1321   NA 137 09/21/2011 0902   K 4.2 07/05/2012 1016   K 4.3 05/09/2012 1321   K 4.2 09/21/2011 0902   CL 105 07/05/2012 1016   CL 101 05/09/2012 1321   CL 101 09/21/2011 0902   CO2 22 07/05/2012 1016   CO2 24 05/09/2012 1321   CO2 28 09/21/2011 0902   BUN 15.0 07/05/2012 1016   BUN 12 05/09/2012 1321   BUN 16 09/21/2011 0902   CREATININE 0.9 07/05/2012 1016   CREATININE 0.72 05/09/2012 1321   CREATININE 1.0 09/21/2011 0902      Component Value Date/Time   CALCIUM 8.8 07/05/2012 1016   CALCIUM 9.0 05/09/2012 1321   CALCIUM 8.7 09/21/2011 0902   ALKPHOS 136 07/05/2012 1016   ALKPHOS 144* 05/09/2012 1321   ALKPHOS 130* 09/21/2011 0902   AST 24 07/05/2012 1016   AST 22 05/09/2012 1321   AST 27 09/21/2011 0902   ALT 19 07/05/2012 1016   ALT 16  05/09/2012 1321   BILITOT 0.50 07/05/2012 1016   BILITOT 0.5 05/09/2012 1321   BILITOT 0.60 09/21/2011 0902       RADIOGRAPHIC STUDIES: Ct Chest W Contrast  06/01/2012  *RADIOLOGY REPORT*  Clinical Data:  Restaging lung cancer.  Bone metastasis.  CT CHEST, ABDOMEN AND PELVIS WITH CONTRAST  Technique:  Multidetector CT imaging of the chest, abdomen  and pelvis was performed following the standard protocol during bolus administration of intravenous contrast.  Contrast:  100 ml Omnipaque 300  Comparison:  CT 03/09/2012  CT CHEST  Findings:  No axillary lymphadenopathy.  No supraclavicular lymphadenopathy.  There is a high a right paratracheal lymph node measuring 7 mm (image #12) which may be increased from 6 mm on prior.  Post partial right pneumonectomy.  In the atelectatic remaining right lower lobe there is an enhancing lesion measuring 21 x 19 mm (image 27) which is increased in size from 17 x 13 mm on prior.  This is concerning for lung cancer recurrence.  Mediastinum shifted into the  right hemithorax.  No aerated right lung is remaining.  In the hyperexpanded left lung, there is no suspicious pulmonary nodules.  IMPRESSION: 1.  Concern for lung cancer recurrence within the atelectatic right lower lobe. 2.  Small right paratracheal lymph node is more prominent than prior.  Recommend attention on follow-up versus FDG PET scan.  CT ABDOMEN AND PELVIS  Findings:  No focal hepatic lesion.  The gallbladder, pancreas, spleen, adrenal glands, kidneys are normal.  The stomach, small bowel, and colon are normal.  Abdominal aorta normal caliber.  No retroperitoneal periportal lymphadenopathy.  No free fluid the pelvis.  The bladder is normal.  Post hysterectomy.  Internal fixation of right hip is noted.  There is a dense sclerotic lesion within the right femur, pelvis, lower spine not changed from prior.  Sclerotic lesions are also present within the cervical spine and left humerus.  IMPRESSION:  1.  No evidence  metastasis in the abdomen or pelvis.  2.   Stable sclerotic bone metastasis.   Original Report Authenticated By: Genevive Bi, M.D.    Ct Abdomen Pelvis W Contrast  06/01/2012  *RADIOLOGY REPORT*  Clinical Data:  Restaging lung cancer.  Bone metastasis.  CT CHEST, ABDOMEN AND PELVIS WITH CONTRAST  Technique:  Multidetector CT imaging of the chest, abdomen and pelvis was performed following the standard protocol during bolus administration of intravenous contrast.  Contrast:  100 ml Omnipaque 300  Comparison:  CT 03/09/2012  CT CHEST  Findings:  No axillary lymphadenopathy.  No supraclavicular lymphadenopathy.  There is a high a right paratracheal lymph node measuring 7 mm (image #12) which may be increased from 6 mm on prior.  Post partial right pneumonectomy.  In the atelectatic remaining right lower lobe there is an enhancing lesion measuring 21 x 19 mm (image 27) which is increased in size from 17 x 13 mm on prior.  This is concerning for lung cancer recurrence.  Mediastinum shifted into the  right hemithorax.  No aerated right lung is remaining.  In the hyperexpanded left lung, there is no suspicious pulmonary nodules.  IMPRESSION: 1.  Concern for lung cancer recurrence within the atelectatic right lower lobe. 2.  Small right paratracheal lymph node is more prominent than prior.  Recommend attention on follow-up versus FDG PET scan.  CT ABDOMEN AND PELVIS  Findings:  No focal hepatic lesion.  The gallbladder, pancreas, spleen, adrenal glands, kidneys are normal.  The stomach, small bowel, and colon are normal.  Abdominal aorta normal caliber.  No retroperitoneal periportal lymphadenopathy.  No free fluid the pelvis.  The bladder is normal.  Post hysterectomy.  Internal fixation of right hip is noted.  There is a dense sclerotic lesion within the right femur, pelvis, lower spine not changed from prior.  Sclerotic lesions are also present within the cervical spine and  left humerus.  IMPRESSION:  1.  No  evidence metastasis in the abdomen or pelvis.  2.   Stable sclerotic bone metastasis.   Original Report Authenticated By: Genevive Bi, M.D.     ASSESSMENT/PLAN: This is a very pleasant 61 years old white female with metastatic non-small cell lung cancer, adenocarcinoma with positive EGFR mutation. She is currently on treatment with Tarceva 150 mg by mouth daily for the last 34 months with no significant evidence for disease progression except for questionable lung cancer recurrence in the atelectatic right lower lobe but this lung is nonfunctional at this point. The patient was discussed with [name]. She will continue on Tarceva at the current dose. She is give a prescription for MS Contin 15 mg, a total of 60 tablets with no refill. She will receive her Xgeva injection today. To further evaluate her complaints of increased left upper arm pain, we will obtain a plain film of the left humerus. Should this fail reveal a metastatic lesion in this area, she will be referred back to Dr. Michell Heinrich for palliative radiotherapy to this area. She'll followup with [name] in one month with repeat CBC differential, C. met, and CT of the chest abdomen and pelvis with contrast to reevaluate her disease. She'll also be due for her next Xgeva injection.   Makayla [name], Makayla Costello E, PA-C   All questions were answered. The patient knows to call the clinic with any problems, questions or concerns. We can certainly see the patient much sooner if necessary.  I spent 20 minutes counseling the patient face to face. The total time spent in the appointment was 30 minutes.

## 2012-08-02 NOTE — Telephone Encounter (Signed)
appts made and printed for pt aom °

## 2012-08-06 ENCOUNTER — Other Ambulatory Visit: Payer: Self-pay | Admitting: Internal Medicine

## 2012-08-06 DIAGNOSIS — C3490 Malignant neoplasm of unspecified part of unspecified bronchus or lung: Secondary | ICD-10-CM

## 2012-08-15 ENCOUNTER — Encounter: Payer: Self-pay | Admitting: Radiation Oncology

## 2012-08-15 NOTE — Progress Notes (Signed)
Referred back to Dr. 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   CT body scheduled 08/24/2012 Mohamed 08/29/2012  Long hx of radiation therapy No indication of a pacemaker AX: sulfa

## 2012-08-16 ENCOUNTER — Ambulatory Visit
Admission: RE | Admit: 2012-08-16 | Discharge: 2012-08-16 | Disposition: A | Discharge status: Home / Self Care | Payer: Managed Care, Other (non HMO) | Source: Ambulatory Visit | Attending: Radiation Oncology | Admitting: Radiation Oncology

## 2012-08-16 ENCOUNTER — Encounter: Payer: Self-pay | Admitting: Radiation Oncology

## 2012-08-16 VITALS — BP 122/82 | HR 100 | Temp 97.5°F | Wt 159.7 lb

## 2012-08-16 DIAGNOSIS — C7952 Secondary malignant neoplasm of bone marrow: Secondary | ICD-10-CM | POA: Insufficient documentation

## 2012-08-16 DIAGNOSIS — C7951 Secondary malignant neoplasm of bone: Secondary | ICD-10-CM | POA: Insufficient documentation

## 2012-08-16 DIAGNOSIS — C3490 Malignant neoplasm of unspecified part of unspecified bronchus or lung: Secondary | ICD-10-CM | POA: Insufficient documentation

## 2012-08-16 DIAGNOSIS — Z510 Encounter for antineoplastic radiation therapy: Secondary | ICD-10-CM | POA: Insufficient documentation

## 2012-08-16 DIAGNOSIS — Z79899 Other long term (current) drug therapy: Secondary | ICD-10-CM | POA: Insufficient documentation

## 2012-08-16 NOTE — Progress Notes (Signed)
Visit today for assessment of Left Proximal Humerus.   Pain level 0 presently since she took 1 Vicodin prior to visit.  She also takes MS Contin 15 g BID.    October 5  last year she had surgical screw implanted and a rod in her femur and she reorts a pain level of 2 in this area.  Walks with a limp.  Reports good appetite.

## 2012-08-16 NOTE — Addendum Note (Signed)
Encounter addended by: Delynn Flavin, RN on: 08/16/2012  5:37 PM<BR>     Documentation filed: Charges VN

## 2012-08-16 NOTE — Patient Instructions (Signed)
   Department of Radiation Oncology  Phone:  [phone] Fax:        [phone]   What to Expect on Simulation Day  "Simulation" is the planning day. The goal for this day is to get everything set up to start your radiation treatments.  You will be getting a CT scan.  Here is a picture of the machine so we can better prepare you for what to expect during your visit: .   You may have had previous scans but this scan is different.  The doctor will use this scan to plan your treatments.   You will be with us for approximately 1 hour.  Upon arrival, register and get a pager; once the pager goes off, take the elevator to the ground level.  Someone will meet you and walk you to the CT simulator.  You will need to change into a gown.  You may also need to remove any jewelry.    We will be asking your name and birthday to verify your identity and ensure your safety.  We apologize in advance that the table that you will be laying on for your CT scan will be a hard surface.  This table surface is necessary to ensure the best treatment possible.    You will be lying on the table in the position that you will be treated in daily. This includes using different items such as molds or arm holders that will keep you in the same position every day.   It is very important while you are on the table to try to relax and hold as still as possible, during and after the CT scan.  Some scans require contrast.  Contrast is a tool that we use to highlight different areas that the doctor may want to see better on the scan.  We will let you know if you will need contrast before your simulation appointment.    After your scan while you are still on the table, the doctor will determine where to place tattoos on your skin. Tattoos are permanent marks that we use to ensure that you are in the right position for your treatment. We place the tattoos by using tattoo ink and a needle to make a small stick just  underneath your skin. They are very small and look like freckles.  You will only receive these on simulation day, not every day.   We will be taking photographs of the tattoos and your position on the table for documentation. These photos are in your chart and are only used by your radiation oncology team.    We will give you your daily schedule for your radiation treatments along with an explanation of what to expect on treatment day.   Thank you for allowing us to be a part of your care!  Please let us know if you have any questions! [facility]:  [phone]  

## 2012-08-16 NOTE — Progress Notes (Signed)
Department of Radiation Oncology  Phone:  [phone] Fax:        [phone]   Name: Makayla [name]   DOB: 24-Feb-1951  MRN: [id number]    Date: 08/16/2012  Follow Up Visit Note  Diagnosis: Metastatic non-small cell lung cancer to the left humerus  Interval History: Makayla [name] presents today for routine followup.  She walked in are free to brief walk and had several supporters. She rates over thousand dollars. She's noticed increasing pain in her left humerus. This is especially worse at night when she is lying in bed and tries to push herself over with her left arm. She has felt that arm getting weaker. She is still taking morphine long-acting 50 mg twice a day and hydrocodone 3 times a day. She is a CT of the body scheduled for November 22 and a followup [name] the following week. She has no other complaints of pain except continued right hip pain which is been stable since her surgery.  Allergies:  Allergies  Allergen Reactions  . Sulfonamide Derivatives     REACTION: nausea    Medications:  Current Outpatient Prescriptions  Medication Sig Dispense Refill  . ALPRAZolam (XANAX) 0.5 MG tablet Take 0.5 mg by mouth as needed.        . calcium-vitamin D (OSCAL WITH D) 500-200 MG-UNIT per tablet Take 1 tablet by mouth daily.        . clindamycin (CLEOCIN T) 1 % lotion Apply topically 2 (two) times daily.        Marland Kitchen denosumab (XGEVA) 120 MG/1.7ML SOLN Inject 120 mg into the skin once.      . diphenhydramine-acetaminophen (TYLENOL PM) 25-500 MG TABS Take 1 tablet by mouth at bedtime as needed.      . docusate sodium (COLACE) 100 MG capsule Take 100 mg by mouth as needed.        . erlotinib (TARCEVA) 150 MG tablet Take 1 tablet (150 mg total) by mouth daily. Take on an empty stomach 1 hour before meals or 2 hours after.  30 tablet  0  . erlotinib (TARCEVA) 150 MG tablet Take 1 tablet (150 mg total) by mouth daily. Take on an empty stomach 1 hour before meals or 2 hours after.  30  tablet  2  . ferrous sulfate 325 (65 FE) MG tablet Take 325 mg by mouth 2 (two) times daily.        Marland Kitchen HYDROcodone-acetaminophen (VICODIN) 5-500 MG per tablet TAKE 1 TABLET BY MOUTH EVERY 4 TO 6 HOURS AS NEEDED FOR BREAKTHROUGH PAIN  60 tablet  0  . Magnesium 250 MG TABS Take 1 tablet by mouth daily.      Marland Kitchen morphine (MS CONTIN) 15 MG 12 hr tablet Take 1 tablet (15 mg total) by mouth 2 (two) times daily.  60 tablet  0  . [DISCONTINUED] warfarin (COUMADIN) 5 MG tablet Take 5 mg by mouth daily.          Physical Exam:   weight is 159 lb 11.2 oz (72.439 kg). Her temperature is 97.5 F (36.4 C). Her blood pressure is 122/82 and her pulse is 100.  She is a pleasant female in no distress sitting comfortably examining table. She is 5 out of 5 strength in her bilateral upper extremities. She has pain with flexion of her left arm.  IMPRESSION: Makayla [name] is a 61 y.o. female with metastatic lung cancer and a new left humeral lesion with pain  PLAN:  I went over  the patient's imaging with her and her family. We agreed to proceed with palliative radiation to this area. We discussed 10 treatments as an outpatient. We discussed hair loss fatigue and skin redness as possible side effects. We'll proceed with simulation today and start her treatment next week. She has signed informed consent and agree to proceed forward.    Lurline Hare, MD

## 2012-08-20 ENCOUNTER — Other Ambulatory Visit: Payer: Self-pay | Admitting: Physician Assistant

## 2012-08-20 DIAGNOSIS — C3490 Malignant neoplasm of unspecified part of unspecified bronchus or lung: Secondary | ICD-10-CM

## 2012-08-22 ENCOUNTER — Other Ambulatory Visit: Payer: Self-pay

## 2012-08-23 ENCOUNTER — Ambulatory Visit: Payer: Managed Care, Other (non HMO)

## 2012-08-23 ENCOUNTER — Encounter: Payer: Self-pay | Admitting: Radiation Oncology

## 2012-08-23 ENCOUNTER — Ambulatory Visit
Admission: RE | Admit: 2012-08-23 | Discharge: 2012-08-23 | Disposition: A | Discharge status: Home / Self Care | Payer: Managed Care, Other (non HMO) | Source: Ambulatory Visit | Attending: Radiation Oncology | Admitting: Radiation Oncology

## 2012-08-24 ENCOUNTER — Other Ambulatory Visit: Payer: Self-pay | Admitting: Internal Medicine

## 2012-08-24 ENCOUNTER — Ambulatory Visit (HOSPITAL_COMMUNITY)
Admission: RE | Admit: 2012-08-24 | Discharge: 2012-08-24 | Disposition: A | Discharge status: Home / Self Care | Payer: Managed Care, Other (non HMO) | Source: Ambulatory Visit | Attending: Physician Assistant | Admitting: Physician Assistant

## 2012-08-24 DIAGNOSIS — C3490 Malignant neoplasm of unspecified part of unspecified bronchus or lung: Secondary | ICD-10-CM | POA: Insufficient documentation

## 2012-08-24 DIAGNOSIS — Z923 Personal history of irradiation: Secondary | ICD-10-CM | POA: Insufficient documentation

## 2012-08-24 DIAGNOSIS — C7951 Secondary malignant neoplasm of bone: Secondary | ICD-10-CM | POA: Insufficient documentation

## 2012-08-24 MED ORDER — IOHEXOL 300 MG/ML  SOLN
100.0000 mL | Freq: Once | INTRAMUSCULAR | Status: AC | PRN
  Administered 2012-08-24: 100 mL via INTRAVENOUS

## 2012-08-27 ENCOUNTER — Ambulatory Visit
Admission: RE | Admit: 2012-08-27 | Discharge: 2012-08-27 | Disposition: A | Discharge status: Home / Self Care | Payer: Managed Care, Other (non HMO) | Source: Ambulatory Visit | Attending: Radiation Oncology | Admitting: Radiation Oncology

## 2012-08-28 ENCOUNTER — Ambulatory Visit (HOSPITAL_BASED_OUTPATIENT_CLINIC_OR_DEPARTMENT_OTHER)
Admission: RE | Admit: 2012-08-28 | Discharge: 2012-08-28 | Disposition: A | Discharge status: Home / Self Care | Payer: Managed Care, Other (non HMO) | Source: Ambulatory Visit | Attending: Radiation Oncology | Admitting: Radiation Oncology

## 2012-08-28 ENCOUNTER — Encounter: Payer: Self-pay | Admitting: Radiation Oncology

## 2012-08-28 ENCOUNTER — Ambulatory Visit
Admission: RE | Admit: 2012-08-28 | Discharge: 2012-08-28 | Disposition: A | Discharge status: Home / Self Care | Payer: Managed Care, Other (non HMO) | Source: Ambulatory Visit | Attending: Radiation Oncology | Admitting: Radiation Oncology

## 2012-08-28 VITALS — BP 114/81 | HR 96 | Resp 18 | Wt 158.4 lb

## 2012-08-28 DIAGNOSIS — C7952 Secondary malignant neoplasm of bone marrow: Secondary | ICD-10-CM

## 2012-08-28 NOTE — Progress Notes (Signed)
Weekly Management Note Current Dose: 3 Gy  Projected Dose: 30 Gy   Narrative:  The patient presents for routine under treatment assessment.  CBCT/MVCT images/Port film x-rays were reviewed.  The chart was checked. Doing well. Pain well controlled. Seeing [facility] tomorrow to discuss results of CT.   Physical Findings: Weight: 158 lb 6.4 oz (71.85 kg). Unchanged  Impression:  The patient is tolerating radiation.  Plan:  Continue treatment as planned. Continue current pain medications.

## 2012-08-28 NOTE — Progress Notes (Addendum)
Patient presents to the clinic today for PUT with Dr. Michell Heinrich prior to radiation treatment. Patient is alert and oriented to person, place, and time. No distress noted. Steady gait noted. Pleasant affect noted. Patient reports constant left arm and right hip pain 9 on a scale of 0-10. Patient reports taking morphine 15 mg bid and vicodin tid which help to make this pain manageable. Patient concerned about sore throat and the fact that her voice seems to "go in and out." Patient to see Dr. Sofie Hartigan tomorrow to review CT scan results. Patient denies numbness or tingling of left arm and right leg. Patient has no complaints. Reported all findings to Dr. Michell Heinrich.

## 2012-08-29 ENCOUNTER — Ambulatory Visit (HOSPITAL_BASED_OUTPATIENT_CLINIC_OR_DEPARTMENT_OTHER): Payer: Managed Care, Other (non HMO)

## 2012-08-29 ENCOUNTER — Telehealth: Payer: Self-pay | Admitting: Internal Medicine

## 2012-08-29 ENCOUNTER — Ambulatory Visit
Admission: RE | Admit: 2012-08-29 | Discharge: 2012-08-29 | Disposition: A | Discharge status: Home / Self Care | Payer: Managed Care, Other (non HMO) | Source: Ambulatory Visit | Attending: Radiation Oncology | Admitting: Radiation Oncology

## 2012-08-29 ENCOUNTER — Ambulatory Visit (HOSPITAL_BASED_OUTPATIENT_CLINIC_OR_DEPARTMENT_OTHER): Payer: Managed Care, Other (non HMO) | Admitting: Internal Medicine

## 2012-08-29 ENCOUNTER — Other Ambulatory Visit: Payer: Self-pay | Admitting: Medical Oncology

## 2012-08-29 ENCOUNTER — Other Ambulatory Visit: Payer: Managed Care, Other (non HMO) | Admitting: Lab

## 2012-08-29 VITALS — BP 128/82 | HR 95 | Temp 97.4°F | Resp 20 | Ht 67.0 in | Wt 158.2 lb

## 2012-08-29 DIAGNOSIS — C7952 Secondary malignant neoplasm of bone marrow: Secondary | ICD-10-CM

## 2012-08-29 DIAGNOSIS — R52 Pain, unspecified: Secondary | ICD-10-CM

## 2012-08-29 DIAGNOSIS — C3430 Malignant neoplasm of lower lobe, unspecified bronchus or lung: Secondary | ICD-10-CM

## 2012-08-29 DIAGNOSIS — C3490 Malignant neoplasm of unspecified part of unspecified bronchus or lung: Secondary | ICD-10-CM

## 2012-08-29 DIAGNOSIS — M549 Dorsalgia, unspecified: Secondary | ICD-10-CM

## 2012-08-29 LAB — CBC WITH DIFFERENTIAL/PLATELET
BASO%: 0.3 % (ref 0.0–2.0)
HCT: 36.5 % (ref 34.8–46.6)
LYMPH%: 20.5 % (ref 14.0–49.7)
MCH: 30.6 pg (ref 25.1–34.0)
MCHC: 33.7 g/dL (ref 31.5–36.0)
MCV: 91 fL (ref 79.5–101.0)
MONO#: 0.4 10*3/uL (ref 0.1–0.9)
NEUT%: 67 % (ref 38.4–76.8)
Platelets: 222 10*3/uL (ref 145–400)
WBC: 4.1 10*3/uL (ref 3.9–10.3)

## 2012-08-29 LAB — COMPREHENSIVE METABOLIC PANEL (CC13)
ALT: 16 U/L (ref 0–55)
Albumin: 3.6 g/dL (ref 3.5–5.0)
CO2: 27 mEq/L (ref 22–29)
Calcium: 9.1 mg/dL (ref 8.4–10.4)
Chloride: 104 mEq/L (ref 98–107)
Potassium: 4.5 mEq/L (ref 3.5–5.1)
Sodium: 139 mEq/L (ref 136–145)
Total Bilirubin: 0.55 mg/dL (ref 0.20–1.20)
Total Protein: 6.9 g/dL (ref 6.4–8.3)

## 2012-08-29 MED ORDER — DENOSUMAB 120 MG/1.7ML ~~LOC~~ SOLN
120.0000 mg | Freq: Once | SUBCUTANEOUS | Status: AC
  Administered 2012-08-29: 120 mg via SUBCUTANEOUS
  Filled 2012-08-29: qty 1.7

## 2012-08-29 MED ORDER — MORPHINE SULFATE ER 15 MG PO TBCR: 15.0000 mg | EXTENDED_RELEASE_TABLET | Freq: Two times a day (BID) | ORAL | Status: DC

## 2012-08-29 NOTE — Telephone Encounter (Signed)
gv and printed pt appt schedule for Dec °

## 2012-08-29 NOTE — Addendum Note (Signed)
Encounter addended by: Agnes Lawrence, RN on: 08/29/2012 10:27 AM<BR>     Documentation filed: Inpatient Patient Education, Inpatient Document Flowsheet, Notes Section, Chief Complaint Section

## 2012-08-29 NOTE — Progress Notes (Signed)
[facility] [phone]   [phone]  OFFICE PROGRESS NOTE  [name], MD [address] [facility], [place]. [place] [address]  DIAGNOSIS: Metastatic non-small cell lung cancer adenocarcinoma, diagnosed in December 2010 in a patient with a remote history of smoking and positive EGFR mutation.   PRIOR THERAPY:  1. Status post palliative radiotherapy to the right hilum as well as anterior inferior ribs and lumbar spine from L2-L5 under the care Dr. Shon Baton.  2. status post right hip prophylactic trochanteric femoral nail and under the care of Dr. Eulas Post  3. palliative radiotherapy to his lumbar spine under the care of Dr.Wentworth   CURRENT THERAPY:  1. Tarceva at 150 mg by mouth daily status post 35 months of treatment  2. Xgeva 120 mg subcutaneously given on a monthly basis for bone metastasis status post 13 treatments    INTERVAL HISTORY: Makayla [name] 61 y.o. female returns to the clinic today for followup visit accompanied by HER-2 sons. The patient is feeling fine today with no specific complaints except for mild back pain. She is tolerating her treatment with palliative radiotherapy to the shoulder under the care of Dr. Michell Heinrich fairly well. The patient has occasional shortness breath with exertion. She denied having any significant weight loss or night sweats. She denied having any cough or hemoptysis. She had repeat CT scan of the chest, abdomen and pelvis performed recently and she is here today for evaluation and discussion of her scan results.  MEDICAL HISTORY: Past Medical History  Diagnosis Date  . COPD (chronic obstructive pulmonary disease)   . Lung cancer     lung ca dx 12/10  . Lung cancer 09/01/2011    bone mets  . Hx of radiation therapy   . Hx antineoplastic chemotherapy     ALLERGIES:  is allergic to sulfonamide derivatives.  MEDICATIONS:  Current Outpatient Prescriptions  Medication  Sig Dispense Refill  . ALPRAZolam (XANAX) 0.5 MG tablet Take 0.5 mg by mouth as needed.        . calcium-vitamin D (OSCAL WITH D) 500-200 MG-UNIT per tablet Take 1 tablet by mouth daily.        . clindamycin (CLEOCIN T) 1 % external solution APPLY TO AFFECTED AREA TWICE A DAY  60 mL  1  . clindamycin (CLEOCIN T) 1 % lotion Apply topically 2 (two) times daily.        Marland Kitchen denosumab (XGEVA) 120 MG/1.7ML SOLN Inject 120 mg into the skin once.      . diphenhydramine-acetaminophen (TYLENOL PM) 25-500 MG TABS Take 1 tablet by mouth at bedtime as needed.      . docusate sodium (COLACE) 100 MG capsule Take 100 mg by mouth as needed.        . erlotinib (TARCEVA) 150 MG tablet Take 1 tablet (150 mg total) by mouth daily. Take on an empty stomach 1 hour before meals or 2 hours after.  30 tablet  0  . erlotinib (TARCEVA) 150 MG tablet Take 1 tablet (150 mg total) by mouth daily. Take on an empty stomach 1 hour before meals or 2 hours after.  30 tablet  2  . ferrous sulfate 325 (65 FE) MG tablet Take 325 mg by mouth 2 (two) times daily.        Marland Kitchen HYDROcodone-acetaminophen (VICODIN) 5-500 MG per tablet TAKE 1 TABLET BY MOUTH EVERY 4 TO 6 HOURS AS NEEDED FOR BREAKTHROUGH PAIN  60 tablet  0  . Magnesium 250 MG TABS Take 1 tablet by mouth daily.      Marland Kitchen morphine (MS CONTIN) 15 MG 12 hr tablet Take 1 tablet (15 mg total) by mouth 2 (two) times daily.  60 tablet  0  . [DISCONTINUED] warfarin (COUMADIN) 5 MG tablet Take 5 mg by mouth daily.          SURGICAL HISTORY:  Past Surgical History  Procedure Date  . Abdominal hysterectomy   . Surgery right leg, rod and pin     REVIEW OF SYSTEMS:  A comprehensive review of systems was negative except for: Constitutional: positive for fatigue Respiratory: positive for dyspnea on exertion Musculoskeletal: positive for bone pain   PHYSICAL EXAMINATION: General appearance: alert, cooperative and no distress Head: Normocephalic, without obvious abnormality, atraumatic Neck:  no adenopathy Lymph nodes: Cervical, supraclavicular, and axillary nodes normal. Resp: clear to auscultation bilaterally Cardio: regular rate and rhythm, S1, S2 normal, no murmur, click, rub or gallop GI: soft, non-tender; bowel sounds normal; no masses,  no organomegaly Extremities: extremities normal, atraumatic, no cyanosis or edema Neurologic: Alert and oriented X 3, normal strength and tone. Normal symmetric reflexes. Normal coordination and gait  ECOG PERFORMANCE STATUS: 1 - Symptomatic but completely ambulatory  There were no vitals taken for this visit.  LABORATORY DATA: Lab Results  Component Value Date   WBC 4.1 08/29/2012   HGB 12.3 08/29/2012   HCT 36.5 08/29/2012   MCV 91.0 08/29/2012   PLT 222 08/29/2012      Chemistry      Component Value Date/Time   NA 134* 08/02/2012 1402   NA 136 05/09/2012 1321   NA 137 09/21/2011 0902   K 4.1 08/02/2012 1402   K 4.3 05/09/2012 1321   K 4.2 09/21/2011 0902   CL 103 08/02/2012 1402   CL 101 05/09/2012 1321   CL 101 09/21/2011 0902   CO2 24 08/02/2012 1402   CO2 24 05/09/2012 1321   CO2 28 09/21/2011 0902   BUN 12.0 08/02/2012 1402   BUN 12 05/09/2012 1321   BUN 16 09/21/2011 0902   CREATININE 0.8 08/02/2012 1402   CREATININE 0.72 05/09/2012 1321   CREATININE 1.0 09/21/2011 0902      Component Value Date/Time   CALCIUM 9.1 08/02/2012 1402   CALCIUM 9.0 05/09/2012 1321   CALCIUM 8.7 09/21/2011 0902   ALKPHOS 150 08/02/2012 1402   ALKPHOS 144* 05/09/2012 1321   ALKPHOS 130* 09/21/2011 0902   AST 26 08/02/2012 1402   AST 22 05/09/2012 1321   AST 27 09/21/2011 0902   ALT 21 08/02/2012 1402   ALT 16 05/09/2012 1321   BILITOT 0.53 08/02/2012 1402   BILITOT 0.5 05/09/2012 1321   BILITOT 0.60 09/21/2011 0902       RADIOGRAPHIC STUDIES: Ct Chest W Contrast  08/24/2012  *RADIOLOGY REPORT*  Clinical Data:  Restaging for lung carcinoma.  Previous radiation therapy.  CT CHEST, ABDOMEN AND PELVIS WITH CONTRAST  Technique:  Multidetector  CT imaging of the chest, abdomen and pelvis was performed following the standard protocol during bolus administration of intravenous contrast.  Contrast: OMNIPAQUE IOHEXOL 300 MG/ML  SOLN  Comparison:  06/01/2012   CT CHEST  Findings:  There is persistent collapse of the right lower lobe.  A rim enhancing low attenuation mass is again seen centrally in the right infrahilar region which measures approximately 2.0 x 2.4 cm and is mildly increased size of this previously measured 1.9 x 2.1 cm.  A 7 mm right paratracheal mediastinal lymph node remains stable.  No new or increased areas of mediastinal lymphadenopathy are seen.  Compensatory hyperinflation of the left lung is again seen which remains clear.  No suspicious left lung nodules or masses are identified.  No evidence of pleural effusion.  No evidence of chest wall mass. Sclerotic bone lesions are again seen involving the left humeral neck and upper thoracic spine, consistent with sclerotic metastasis.  No new sclerotic bone metastases are identified within the thorax.  IMPRESSION:  1.  Mild increase in size of 2.4 cm mass in the right infrahilar region, with persistent right lower lobe collapse. 2.  Stable 7 mm right paratracheal mediastinal lymph node. 3.  Stable sclerotic bone metastases.   CT ABDOMEN AND PELVIS  Findings:  The abdominal parenchymal organs including both adrenal glands are normal in appearance.  Gallbladder is unremarkable.  No evidence of hydronephrosis.  No soft tissue masses or lymphadenopathy identified elsewhere within the abdomen or pelvis.  Previous hysterectomy noted.  No evidence of inflammatory process or abnormal fluid collections.  No evidence of bowel wall thickening or dilatation.  Multiple sclerotic bone metastases are seen in the lumbar spine and pelvis, which show no significant change.  IMPRESSION:  1.  No evidence of soft tissue metastatic disease within the abdomen or pelvis.  2.  Stable appearance of sclerotic  bone metastases.   Original Report Authenticated By: Myles Rosenthal, M.D.    ASSESSMENT: This is a very pleasant 61 years old white female with metastatic non-small cell lung cancer, adenocarcinoma with positive EGFR mutation, currently on treatment with Tarceva and 50 mg by mouth daily for the last 3 years with no significant evidence for disease progression except for mildly increased right infrahilar nodule which is in the region of the collapsed right lower lobe.   PLAN: I discussed the scan results with the patient and her family and showed the images of the scan. I recommended for her to continue on the same treatment regimen was Tarceva 150 mg by mouth daily. I will continue to monitor the infrahilar nodule closely but it is not contributing to any symptoms in this patient of this point. The patient would also continue on monthly Xgeva as scheduled. She is currently on calcium, vitamin D and magnesium supplements. She would come back for followup visit in one month's for reevaluation and management any adverse effect of her treatment. The patient was advised to call immediately if she has any concerning symptoms in the interval. She was given a refill of her pain medication today.  All questions were answered. The patient knows to call the clinic with any problems, questions or concerns. We can certainly see the patient much sooner if necessary.  I spent 15 minutes counseling the patient face to face. The total time spent in the appointment was 25 minutes.

## 2012-08-29 NOTE — Progress Notes (Signed)
Late entry from 08/28/2012 1649. Oriented patient to routine of the clinic and staff. Provided patient with RADIATION THERAPY AND YOU handbook then, reviewed pertinent information. Educated patient on potential side effects and management such as, skin changes. Provided patient with this writer's business card and encouraged to call with future needs. All questions answered. Patient verbalized understanding of all reviewed.

## 2012-08-31 NOTE — Patient Instructions (Signed)
Your scan showed no evidence for disease progression except for mild increase in the right infrahilar lesion. Continue Tarceva as scheduled. Followup in one month

## 2012-09-03 ENCOUNTER — Ambulatory Visit
Admission: RE | Admit: 2012-09-03 | Discharge: 2012-09-03 | Disposition: A | Discharge status: Home / Self Care | Payer: Managed Care, Other (non HMO) | Source: Ambulatory Visit | Attending: Radiation Oncology | Admitting: Radiation Oncology

## 2012-09-04 ENCOUNTER — Ambulatory Visit
Admission: RE | Admit: 2012-09-04 | Discharge: 2012-09-04 | Disposition: A | Discharge status: Home / Self Care | Payer: Managed Care, Other (non HMO) | Source: Ambulatory Visit | Attending: Radiation Oncology | Admitting: Radiation Oncology

## 2012-09-04 ENCOUNTER — Ambulatory Visit
Admission: RE | Admit: 2012-09-04 | Payer: Managed Care, Other (non HMO) | Source: Ambulatory Visit | Admitting: Radiation Oncology

## 2012-09-04 VITALS — BP 124/73 | HR 90 | Temp 98.6°F | Wt 156.4 lb

## 2012-09-04 DIAGNOSIS — C7951 Secondary malignant neoplasm of bone: Secondary | ICD-10-CM

## 2012-09-04 NOTE — Progress Notes (Signed)
Patient here for weekly under treat visit for radiation treatment of left humerus.Denies pain but states she has already taken pain med this morning.Reviewed routine and side effects of fatigue.Patient had no skin issues with previous radiation treatments.Fatigue relieved with rest periods.

## 2012-09-04 NOTE — Progress Notes (Signed)
Weekly Management Note Current Dose: 15  Gy  Projected Dose: 30 Gy   Narrative:  The patient presents for routine under treatment assessment.  CBCT/MVCT images/Port film x-rays were reviewed.  The chart was checked. Pain possibly slightly worse but controlled with treatment. Scan ok per [facility] so no further chemo at this time.   Physical Findings: Weight: 156 lb 6.4 oz (70.943 kg). Unchanged  Impression:  The patient is tolerating radiation.  Plan:  Continue treatment as planned.

## 2012-09-05 ENCOUNTER — Ambulatory Visit
Admission: RE | Admit: 2012-09-05 | Discharge: 2012-09-05 | Disposition: A | Discharge status: Home / Self Care | Payer: Managed Care, Other (non HMO) | Source: Ambulatory Visit | Attending: Radiation Oncology | Admitting: Radiation Oncology

## 2012-09-06 ENCOUNTER — Ambulatory Visit
Admission: RE | Admit: 2012-09-06 | Discharge: 2012-09-06 | Disposition: A | Discharge status: Home / Self Care | Payer: Managed Care, Other (non HMO) | Source: Ambulatory Visit | Attending: Radiation Oncology | Admitting: Radiation Oncology

## 2012-09-07 ENCOUNTER — Ambulatory Visit
Admission: RE | Admit: 2012-09-07 | Discharge: 2012-09-07 | Disposition: A | Discharge status: Home / Self Care | Payer: Managed Care, Other (non HMO) | Source: Ambulatory Visit | Attending: Radiation Oncology | Admitting: Radiation Oncology

## 2012-09-10 ENCOUNTER — Ambulatory Visit
Admission: RE | Admit: 2012-09-10 | Discharge: 2012-09-10 | Disposition: A | Discharge status: Home / Self Care | Payer: Managed Care, Other (non HMO) | Source: Ambulatory Visit | Attending: Radiation Oncology | Admitting: Radiation Oncology

## 2012-09-11 ENCOUNTER — Other Ambulatory Visit: Payer: Self-pay | Admitting: Internal Medicine

## 2012-09-11 ENCOUNTER — Encounter: Payer: Self-pay | Admitting: Radiation Oncology

## 2012-09-11 ENCOUNTER — Other Ambulatory Visit: Payer: Self-pay | Admitting: Medical Oncology

## 2012-09-11 ENCOUNTER — Ambulatory Visit
Admission: RE | Admit: 2012-09-11 | Discharge: 2012-09-11 | Disposition: A | Discharge status: Home / Self Care | Payer: Managed Care, Other (non HMO) | Source: Ambulatory Visit | Attending: Radiation Oncology | Admitting: Radiation Oncology

## 2012-09-11 VITALS — BP 128/84 | HR 102 | Resp 18 | Wt 155.0 lb

## 2012-09-11 DIAGNOSIS — R52 Pain, unspecified: Secondary | ICD-10-CM

## 2012-09-11 DIAGNOSIS — C7951 Secondary malignant neoplasm of bone: Secondary | ICD-10-CM

## 2012-09-11 MED ORDER — HYDROCODONE-ACETAMINOPHEN 5-500 MG PO TABS: 1.0000 | ORAL_TABLET | ORAL | Status: DC | PRN

## 2012-09-11 NOTE — Progress Notes (Signed)
Patient presents to the clinic today unaccompanied for PUT following final treatment with Dr. Michell Heinrich. Patient alert and oriented to person, place, and time. No distress noted. Slow steady gait noted. Pleasant affect noted. Patient denies pain at this time. Patient has no complaints. Provided patient with a one month follow up appointment card. Encouraged patient to contact our staff with needs and she verbalized understanding.

## 2012-09-11 NOTE — Progress Notes (Signed)
Weekly Management Note Current Dose:  30 Gy  Projected Dose:  30Gy   Narrative:  The patient presents for routine under treatment assessment.  CBCT/MVCT images/Port film x-rays were reviewed.  The chart was checked. Doing well. Pain better. Still on long acting narcotics.   Physical Findings: Weight: 155 lb (70.308 kg). Unchanged  Impression:  Finished RT today. Satisfactory response.  Plan:  F/u in 1 month

## 2012-09-11 NOTE — Telephone Encounter (Signed)
rx given to pt

## 2012-09-12 ENCOUNTER — Ambulatory Visit: Payer: Managed Care, Other (non HMO)

## 2012-09-13 ENCOUNTER — Ambulatory Visit: Payer: Managed Care, Other (non HMO)

## 2012-09-14 ENCOUNTER — Ambulatory Visit: Payer: Managed Care, Other (non HMO)

## 2012-09-17 ENCOUNTER — Ambulatory Visit: Payer: Managed Care, Other (non HMO)

## 2012-09-18 ENCOUNTER — Ambulatory Visit: Payer: Managed Care, Other (non HMO)

## 2012-09-20 NOTE — Progress Notes (Signed)
  Radiation Oncology         [phone] ________________________________  Name: Makayla [name] MRN: [id number]  Date: 08/23/2012  DOB: November 14, 1950  Simulation Verification Note  Status: outpatient  NARRATIVE: The patient was brought to the treatment unit and placed in the planned treatment position. The clinical setup was verified. Then port films were obtained and uploaded to the radiation oncology medical record software.  The treatment beams were carefully compared against the planned radiation fields. The position location and shape of the radiation fields was reviewed. The targeted volume of tissue appears appropriately covered by the radiation beams. Organs at risk appear to be excluded as planned.  Based on my personal review, I approved the simulation verification. The patient's treatment will proceed as planned.  ------------------------------------------------  Lurline Hare, MD

## 2012-09-20 NOTE — Progress Notes (Signed)
  Radiation Oncology         [phone] ________________________________  Name: Makayla [name] MRN: [id number]  Date: 09/11/2012  DOB: [birth year]  End of Treatment Note  Diagnosis:  Metastatic non-small cell lung cancer to the left humerus  Indication for treatment:  Palliative      Radiation treatment dates:   11/25/203-09/11/2012  Site/dose:   Left humerus / 30 Gy at 3 Gy    Beams/energy:   AP/PA / 6 MV photons  Narrative: The patient tolerated radiation treatment relatively well.   Her pain was slightly improved.  Plan: The patient has completed radiation treatment. The patient will return to radiation oncology clinic for routine followup in one month. I advised them to call or return sooner if they have any questions or concerns related to their recovery or treatment.  ------------------------------------------------  Lurline Hare, MD

## 2012-09-24 ENCOUNTER — Other Ambulatory Visit: Payer: Self-pay | Admitting: Internal Medicine

## 2012-09-24 DIAGNOSIS — C3490 Malignant neoplasm of unspecified part of unspecified bronchus or lung: Secondary | ICD-10-CM

## 2012-09-28 ENCOUNTER — Ambulatory Visit (HOSPITAL_BASED_OUTPATIENT_CLINIC_OR_DEPARTMENT_OTHER): Payer: Managed Care, Other (non HMO)

## 2012-09-28 ENCOUNTER — Telehealth: Payer: Self-pay | Admitting: Internal Medicine

## 2012-09-28 ENCOUNTER — Ambulatory Visit (HOSPITAL_BASED_OUTPATIENT_CLINIC_OR_DEPARTMENT_OTHER): Payer: Managed Care, Other (non HMO) | Admitting: Physician Assistant

## 2012-09-28 ENCOUNTER — Encounter: Payer: Self-pay | Admitting: Physician Assistant

## 2012-09-28 ENCOUNTER — Other Ambulatory Visit (HOSPITAL_BASED_OUTPATIENT_CLINIC_OR_DEPARTMENT_OTHER): Payer: Managed Care, Other (non HMO)

## 2012-09-28 VITALS — BP 119/84 | HR 93 | Temp 96.8°F | Resp 18 | Ht 67.0 in | Wt 155.1 lb

## 2012-09-28 VITALS — BP 138/83 | HR 103 | Temp 97.5°F

## 2012-09-28 DIAGNOSIS — C3490 Malignant neoplasm of unspecified part of unspecified bronchus or lung: Secondary | ICD-10-CM

## 2012-09-28 DIAGNOSIS — C7951 Secondary malignant neoplasm of bone: Secondary | ICD-10-CM

## 2012-09-28 DIAGNOSIS — C3430 Malignant neoplasm of lower lobe, unspecified bronchus or lung: Secondary | ICD-10-CM

## 2012-09-28 DIAGNOSIS — C7952 Secondary malignant neoplasm of bone marrow: Secondary | ICD-10-CM

## 2012-09-28 LAB — CBC WITH DIFFERENTIAL/PLATELET
BASO%: 0.1 % (ref 0.0–2.0)
LYMPH%: 14.6 % (ref 14.0–49.7)
MCHC: 33.2 g/dL (ref 31.5–36.0)
MONO#: 0.4 10*3/uL (ref 0.1–0.9)
Platelets: 208 10*3/uL (ref 145–400)
RBC: 3.86 10*6/uL (ref 3.70–5.45)
RDW: 13.3 % (ref 11.2–14.5)
WBC: 4.6 10*3/uL (ref 3.9–10.3)

## 2012-09-28 LAB — COMPREHENSIVE METABOLIC PANEL (CC13)
ALT: 9 U/L (ref 0–55)
Alkaline Phosphatase: 121 U/L (ref 40–150)
CO2: 25 mEq/L (ref 22–29)
Sodium: 139 mEq/L (ref 136–145)
Total Bilirubin: 0.44 mg/dL (ref 0.20–1.20)
Total Protein: 6.6 g/dL (ref 6.4–8.3)

## 2012-09-28 MED ORDER — MORPHINE SULFATE ER 15 MG PO TBCR: 15.0000 mg | EXTENDED_RELEASE_TABLET | Freq: Two times a day (BID) | ORAL | Status: DC

## 2012-09-28 MED ORDER — DENOSUMAB 120 MG/1.7ML ~~LOC~~ SOLN
120.0000 mg | Freq: Once | SUBCUTANEOUS | Status: AC
  Administered 2012-09-28: 120 mg via SUBCUTANEOUS
  Filled 2012-09-28: qty 1.7

## 2012-09-28 NOTE — Telephone Encounter (Signed)
appts made  And printed for pt aom 

## 2012-09-28 NOTE — Patient Instructions (Addendum)
Continue Tarceva 150 mg by mouth daily Follow up in 1 month for another symptom management visit and your next Xgeva injection

## 2012-09-28 NOTE — Progress Notes (Signed)
[facility] [phone]   [phone]  OFFICE PROGRESS NOTE  [name], MD [address] [facility], [place]. [place] [address]  DIAGNOSIS: Metastatic non-small cell lung cancer adenocarcinoma, diagnosed in December 2010 in a patient with a remote history of smoking and positive EGFR mutation.   PRIOR THERAPY:  1. Status post palliative radiotherapy to the right hilum as well as anterior inferior ribs and lumbar spine from L2-L5 under the care Dr. Shon Baton.  2. status post right hip prophylactic trochanteric femoral nail and under the care of Dr. Eulas Post  3. palliative radiotherapy to his lumbar spine under the care of Dr.Wentworth   CURRENT THERAPY:  1. Tarceva at 150 mg by mouth daily status post 36 months of treatment  2. Xgeva 120 mg subcutaneously given on a monthly basis for bone metastasis status post 14 treatments    INTERVAL HISTORY: VENICIA VANDALL 61 y.o. female returns to the clinic today for followup visit accompanied by her son Arlys John. Overall she is doing well. She continues to maintain good pain control on her current pain medications of MS Contin at 15 mg by mouth every 12 hours and Vicodin as needed for breakthrough pain. She requests a refill for her MS Contin. She completed the palliative radiation therapy on her left upper arm with good pain control there as well. She reports that she has a followup appointment with Dr. Michell Heinrich on 10/11/2012. She has only occasional episodes of diarrhea related to the Tarceva which are manageable. She reports primarily dry skin related to the Tarceva is supposed to the acneform rash. She voiced no other specific complaints today. The patient has occasional shortness breath with exertion. She denied having any significant weight loss or night sweats. She denied having any cough or hemoptysis. She does report she had a recent upper respiratory illness with any sore  throat.  MEDICAL HISTORY: Past Medical History  Diagnosis Date  . COPD (chronic obstructive pulmonary disease)   . Lung cancer     lung ca dx 12/10  . Lung cancer 09/01/2011    bone mets  . Hx of radiation therapy   . Hx antineoplastic chemotherapy     ALLERGIES:  is allergic to sulfonamide derivatives.  MEDICATIONS:  Current Outpatient Prescriptions  Medication Sig Dispense Refill  . ALPRAZolam (XANAX) 0.5 MG tablet Take 0.5 mg by mouth as needed.        . calcium-vitamin D (OSCAL WITH D) 500-200 MG-UNIT per tablet Take 1 tablet by mouth daily.        . clindamycin (CLEOCIN T) 1 % external solution APPLY TO AFFECTED AREA TWICE A DAY  60 mL  1  . denosumab (XGEVA) 120 MG/1.7ML SOLN Inject 120 mg into the skin once.      . diphenhydramine-acetaminophen (TYLENOL PM) 25-500 MG TABS Take 1 tablet by mouth at bedtime as needed.      . docusate sodium (COLACE) 100 MG capsule Take 100 mg by mouth as needed.        . erlotinib (TARCEVA) 150 MG tablet Take 1 tablet (150 mg total) by mouth daily. Take on an empty stomach 1 hour before meals or 2 hours after.  30 tablet  2  . ferrous sulfate 325 (65 FE) MG tablet Take 325 mg by mouth 2 (two) times daily.        Marland Kitchen HYDROcodone-acetaminophen (VICODIN) 5-500 MG per tablet TAKE 1 TABLET BY MOUTH EVERY 4  HOURS AS NEEDED FOR PAIN  60 tablet  0  . Magnesium 250 MG TABS Take 1 tablet by mouth daily.      Marland Kitchen morphine (MS CONTIN) 15 MG 12 hr tablet Take 1 tablet (15 mg total) by mouth 2 (two) times daily.  60 tablet  0  . [DISCONTINUED] warfarin (COUMADIN) 5 MG tablet Take 5 mg by mouth daily.          SURGICAL HISTORY:  Past Surgical History  Procedure Date  . Abdominal hysterectomy   . Surgery right leg, rod and pin     REVIEW OF SYSTEMS:  Pertinent items are noted in HPI.   PHYSICAL EXAMINATION: General appearance: alert, cooperative and no distress Head: Normocephalic, without obvious abnormality, atraumatic Neck: There is a 1+ left mid  anterior cervical palpable lymph node it is nontender, no other palpable cervical lymphadenopathy present Lymph nodes: Cervical adenopathy: Left mid cervical chain 1+ nontender node Resp: clear to auscultation bilaterally Cardio: regular rate and rhythm, S1, S2 normal, no murmur, click, rub or gallop GI: soft, non-tender; bowel sounds normal; no masses,  no organomegaly Extremities: extremities normal, atraumatic, no cyanosis or edema Neurologic: Alert and oriented X 3, normal strength and tone. Normal symmetric reflexes. Normal coordination and gait  ECOG PERFORMANCE STATUS: 1 - Symptomatic but completely ambulatory  Blood pressure 119/84, pulse 93, temperature 96.8 F (36 C), temperature source Oral, resp. rate 18, height 5\' 7"  (1.702 m), weight 155 lb 1.6 oz (70.353 kg).  LABORATORY DATA: Lab Results  Component Value Date   WBC 4.6 09/28/2012   HGB 11.9 09/28/2012   HCT 35.8 09/28/2012   MCV 92.8 09/28/2012   PLT 208 09/28/2012      Chemistry      Component Value Date/Time   NA 139 09/28/2012 1323   NA 136 05/09/2012 1321   NA 137 09/21/2011 0902   K 4.0 09/28/2012 1323   K 4.3 05/09/2012 1321   K 4.2 09/21/2011 0902   CL 109* 09/28/2012 1323   CL 101 05/09/2012 1321   CL 101 09/21/2011 0902   CO2 25 09/28/2012 1323   CO2 24 05/09/2012 1321   CO2 28 09/21/2011 0902   BUN 14.0 09/28/2012 1323   BUN 12 05/09/2012 1321   BUN 16 09/21/2011 0902   CREATININE 0.8 09/28/2012 1323   CREATININE 0.72 05/09/2012 1321   CREATININE 1.0 09/21/2011 0902      Component Value Date/Time   CALCIUM 8.9 09/28/2012 1323   CALCIUM 9.0 05/09/2012 1321   CALCIUM 8.7 09/21/2011 0902   ALKPHOS 121 09/28/2012 1323   ALKPHOS 144* 05/09/2012 1321   ALKPHOS 130* 09/21/2011 0902   AST 19 09/28/2012 1323   AST 22 05/09/2012 1321   AST 27 09/21/2011 0902   ALT 9 09/28/2012 1323   ALT 16 05/09/2012 1321   BILITOT 0.44 09/28/2012 1323   BILITOT 0.5 05/09/2012 1321   BILITOT 0.60 09/21/2011 0902        RADIOGRAPHIC STUDIES: Ct Chest W Contrast  08/24/2012  *RADIOLOGY REPORT*  Clinical Data:  Restaging for lung carcinoma.  Previous radiation therapy.  CT CHEST, ABDOMEN AND PELVIS WITH CONTRAST  Technique:  Multidetector CT imaging of the chest, abdomen and pelvis was performed following the standard protocol during bolus administration of intravenous contrast.  Contrast: OMNIPAQUE IOHEXOL 300 MG/ML  SOLN  Comparison:  06/01/2012   CT CHEST  Findings:  There is persistent collapse of the right lower lobe.  A rim enhancing low  attenuation mass is again seen centrally in the right infrahilar region which measures approximately 2.0 x 2.4 cm and is mildly increased size of this previously measured 1.9 x 2.1 cm.  A 7 mm right paratracheal mediastinal lymph node remains stable.  No new or increased areas of mediastinal lymphadenopathy are seen.  Compensatory hyperinflation of the left lung is again seen which remains clear.  No suspicious left lung nodules or masses are identified.  No evidence of pleural effusion.  No evidence of chest wall mass. Sclerotic bone lesions are again seen involving the left humeral neck and upper thoracic spine, consistent with sclerotic metastasis.  No new sclerotic bone metastases are identified within the thorax.  IMPRESSION:  1.  Mild increase in size of 2.4 cm mass in the right infrahilar region, with persistent right lower lobe collapse. 2.  Stable 7 mm right paratracheal mediastinal lymph node. 3.  Stable sclerotic bone metastases.   CT ABDOMEN AND PELVIS  Findings:  The abdominal parenchymal organs including both adrenal glands are normal in appearance.  Gallbladder is unremarkable.  No evidence of hydronephrosis.  No soft tissue masses or lymphadenopathy identified elsewhere within the abdomen or pelvis.  Previous hysterectomy noted.  No evidence of inflammatory process or abnormal fluid collections.  No evidence of bowel wall thickening or dilatation.  Multiple  sclerotic bone metastases are seen in the lumbar spine and pelvis, which show no significant change.  IMPRESSION:  1.  No evidence of soft tissue metastatic disease within the abdomen or pelvis.  2.  Stable appearance of sclerotic bone metastases.   Original Report Authenticated By: Myles Rosenthal, M.D.    ASSESSMENT/PLAN: This is a very pleasant 61 years old white female with metastatic non-small cell lung cancer, adenocarcinoma with positive EGFR mutation, currently on treatment with Tarceva and 50 mg by mouth daily for the last 3 years with no significant evidence for disease progression except for mildly increased right infrahilar nodule which is in the region of the collapsed right lower lobe. The patient was discussed with Dr. Darrold Span in Dr. Asa Lente absence. She will continue on Tarceva at 150 mg by mouth daily as well as monthly Xgeva injections. She will also continue on calcium and vitamin D and magnesium supplements. She was given a prescription for MS Contin 15 mg tablets, one by mouth every 12 hours daily for pain a total of 60 tablets with no refill. She'll followup in one month for another symptom management visit with a repeat CBC differential, C. met as well as an injection appointment for her next Xgeva injection. We will monitor the left mid anterior cervical lymph node on her next followup visit. I suspect that this is reactive from her recent upper respiratory illness but we will continue to monitor this area.   Laural Benes, Kimarion Chery E, PA-C   All questions were answered. The patient knows to call the clinic with any problems, questions or concerns. We can certainly see the patient much sooner if necessary.  I spent 20 minutes counseling the patient face to face. The total time spent in the appointment was 30 minutes.

## 2012-10-01 ENCOUNTER — Telehealth: Payer: Self-pay | Admitting: *Deleted

## 2012-10-01 NOTE — Telephone Encounter (Signed)
mammogram results given to Dr Donnald Garre to review.  SLJ

## 2012-10-09 ENCOUNTER — Other Ambulatory Visit: Payer: Self-pay | Admitting: Medical Oncology

## 2012-10-09 ENCOUNTER — Telehealth: Payer: Self-pay | Admitting: Medical Oncology

## 2012-10-09 NOTE — Telephone Encounter (Signed)
Confirmed recent narcotic rx

## 2012-10-10 ENCOUNTER — Other Ambulatory Visit: Payer: Self-pay | Admitting: Internal Medicine

## 2012-10-11 ENCOUNTER — Other Ambulatory Visit: Payer: Self-pay | Admitting: Internal Medicine

## 2012-10-11 ENCOUNTER — Ambulatory Visit
Admission: RE | Admit: 2012-10-11 | Discharge: 2012-10-11 | Disposition: A | Discharge status: Home / Self Care | Payer: Managed Care, Other (non HMO) | Source: Ambulatory Visit | Attending: Radiation Oncology | Admitting: Radiation Oncology

## 2012-10-11 ENCOUNTER — Encounter: Payer: Self-pay | Admitting: Radiation Oncology

## 2012-10-11 VITALS — BP 122/67 | HR 105 | Temp 98.2°F | Resp 20 | Wt 155.3 lb

## 2012-10-11 DIAGNOSIS — C7952 Secondary malignant neoplasm of bone marrow: Secondary | ICD-10-CM

## 2012-10-11 NOTE — Progress Notes (Signed)
Patient here follow up s/p radiation to right humerous on 08/27/12-09/11/12  Patient alert,oriented x3, slight limp, stated pain in right shoulder much better, still pain in right hip from surgery,  Xgeva injections monthly last one 10/25/12, on Tarceva 150 mg po daily, no c/o pain at present, short of breath with activity, room air sats=100%  3:10 PM

## 2012-10-11 NOTE — Progress Notes (Signed)
Department of Radiation Oncology  Phone:  [phone] Fax:        [phone]   Name: Makayla [name]   DOB: 1950/10/15  MRN: [id number]    Date: 10/11/2012  Follow Up Visit Note  Diagnosis: Metastatic non small cell lung cancer  Interval since last radiation: 1 month  Interval History: Makayla [name] presents today for routine followup.  Her arm pain is much improved. She is slightly concerned about a neck node that was found on exam by [name] PA last month. She is scheduled to see her later this month with scans. She says is neck node is about the same size. It's only slightly painful. She still taking her morphine twice a day and her when necessary medications every 6 hours. She would like to go down on her pain medication possible. Is not having any difficulties in swallowing hasn't noticed any change in the quality of her voice.  Allergies:  Allergies  Allergen Reactions  . Sulfonamide Derivatives     REACTION: nausea    Medications:  Current Outpatient Prescriptions  Medication Sig Dispense Refill  . ALPRAZolam (XANAX) 0.5 MG tablet Take 0.5 mg by mouth as needed.        . calcium-vitamin D (OSCAL WITH D) 500-200 MG-UNIT per tablet Take 1 tablet by mouth daily.        . clindamycin (CLEOCIN T) 1 % external solution APPLY TO AFFECTED AREA TWICE A DAY  60 mL  1  . diphenhydramine-acetaminophen (TYLENOL PM) 25-500 MG TABS Take 1 tablet by mouth at bedtime as needed.      . docusate sodium (COLACE) 100 MG capsule Take 100 mg by mouth as needed.        . erlotinib (TARCEVA) 150 MG tablet Take 1 tablet (150 mg total) by mouth daily. Take on an empty stomach 1 hour before meals or 2 hours after.  30 tablet  2  . ferrous sulfate 325 (65 FE) MG tablet Take 325 mg by mouth 2 (two) times daily.        Marland Kitchen HYDROcodone-acetaminophen (VICODIN) 5-500 MG per tablet TAKE 1 TABLET BY MOUTH EVERY 4 HOURS AS NEEDED  60 tablet  0  . Magnesium 250 MG TABS Take 1 tablet by mouth daily.      Marland Kitchen  morphine (MS CONTIN) 15 MG 12 hr tablet Take 1 tablet (15 mg total) by mouth 2 (two) times daily.  60 tablet  0  . denosumab (XGEVA) 120 MG/1.7ML SOLN Inject 120 mg into the skin every 30 (thirty) days.       . [DISCONTINUED] warfarin (COUMADIN) 5 MG tablet Take 5 mg by mouth daily.          Physical Exam:   weight is 155 lb 4.8 oz (70.444 kg). Her oral temperature is 98.2 F (36.8 C). Her blood pressure is 122/67 and her pulse is 105. Her respiration is 20 and oxygen saturation is 100%.  She is a pleasant female in no distress sitting comfortably examining table. She is significantly less pain when moving her arm above her head and she did prior to treatment  IMPRESSION: Makayla [name] is a 62 y.o. female is post palliative radiation to the left humerus with good pain response  PLAN:  I will contact [name] in regards to imaging her neck with her next scans. This is likely metastatic disease or cervical lymph node. I discussed that with her and her son. I much is doing so well from a pain  standpoint.    Lurline Hare, MD

## 2012-10-11 NOTE — Progress Notes (Signed)
patients pager was [phone], in epic read as 247, we paged patient on pager [phone], after a few minutes waiting at elevator  I went up and got patient on 1st floor, gave pager and information to [facility] and informed Dr.Wentworth as well, apologized to the patient for the miscommunication on the pager 3:16 PM

## 2012-10-12 ENCOUNTER — Telehealth: Payer: Self-pay | Admitting: Medical Oncology

## 2012-10-12 NOTE — Telephone Encounter (Signed)
Pt called and said pharmacy did not have refiill auth for vicodin. I called in hydrocodone

## 2012-10-25 ENCOUNTER — Ambulatory Visit (HOSPITAL_BASED_OUTPATIENT_CLINIC_OR_DEPARTMENT_OTHER): Payer: Managed Care, Other (non HMO)

## 2012-10-25 ENCOUNTER — Encounter: Payer: Self-pay | Admitting: Physician Assistant

## 2012-10-25 ENCOUNTER — Ambulatory Visit (HOSPITAL_BASED_OUTPATIENT_CLINIC_OR_DEPARTMENT_OTHER): Payer: Managed Care, Other (non HMO) | Admitting: Physician Assistant

## 2012-10-25 ENCOUNTER — Other Ambulatory Visit (HOSPITAL_BASED_OUTPATIENT_CLINIC_OR_DEPARTMENT_OTHER): Payer: Managed Care, Other (non HMO) | Admitting: Lab

## 2012-10-25 ENCOUNTER — Telehealth: Payer: Self-pay | Admitting: Internal Medicine

## 2012-10-25 VITALS — BP 126/83 | HR 110 | Temp 97.5°F | Resp 18 | Ht 67.0 in | Wt 153.5 lb

## 2012-10-25 DIAGNOSIS — R220 Localized swelling, mass and lump, head: Secondary | ICD-10-CM

## 2012-10-25 DIAGNOSIS — C7952 Secondary malignant neoplasm of bone marrow: Secondary | ICD-10-CM

## 2012-10-25 DIAGNOSIS — C7951 Secondary malignant neoplasm of bone: Secondary | ICD-10-CM

## 2012-10-25 DIAGNOSIS — R221 Localized swelling, mass and lump, neck: Secondary | ICD-10-CM

## 2012-10-25 DIAGNOSIS — C3430 Malignant neoplasm of lower lobe, unspecified bronchus or lung: Secondary | ICD-10-CM

## 2012-10-25 DIAGNOSIS — C3490 Malignant neoplasm of unspecified part of unspecified bronchus or lung: Secondary | ICD-10-CM

## 2012-10-25 LAB — COMPREHENSIVE METABOLIC PANEL (CC13)
ALT: 15 U/L (ref 0–55)
Alkaline Phosphatase: 143 U/L (ref 40–150)
Sodium: 137 mEq/L (ref 136–145)
Total Bilirubin: 0.53 mg/dL (ref 0.20–1.20)
Total Protein: 7.2 g/dL (ref 6.4–8.3)

## 2012-10-25 LAB — CBC WITH DIFFERENTIAL/PLATELET
BASO%: 0.3 % (ref 0.0–2.0)
EOS%: 0.5 % (ref 0.0–7.0)
LYMPH%: 12.5 % — ABNORMAL LOW (ref 14.0–49.7)
MCHC: 34.6 g/dL (ref 31.5–36.0)
MCV: 91.3 fL (ref 79.5–101.0)
MONO%: 8.5 % (ref 0.0–14.0)
Platelets: 236 10*3/uL (ref 145–400)
RBC: 3.73 10*6/uL (ref 3.70–5.45)
WBC: 5.6 10*3/uL (ref 3.9–10.3)

## 2012-10-25 MED ORDER — HYDROCODONE-ACETAMINOPHEN 5-300 MG PO TABS: 1.0000 | ORAL_TABLET | Freq: Four times a day (QID) | ORAL | Status: DC | PRN

## 2012-10-25 MED ORDER — MORPHINE SULFATE ER 15 MG PO TBCR: EXTENDED_RELEASE_TABLET | ORAL | Status: DC

## 2012-10-25 MED ORDER — DENOSUMAB 120 MG/1.7ML ~~LOC~~ SOLN
120.0000 mg | Freq: Once | SUBCUTANEOUS | Status: AC
  Administered 2012-10-25: 120 mg via SUBCUTANEOUS
  Filled 2012-10-25: qty 1.7

## 2012-10-25 NOTE — Patient Instructions (Addendum)
You will be scheduled for a CT of your neck to evaluate your complaints of pain and swelling in this area Continue Tarceva 150 mg by mouth daily Follow up with [name] in 1 month with a restaging CT scan of your chest, abdomen and pelvis to re-evaluate your disease

## 2012-10-25 NOTE — Telephone Encounter (Signed)
gv and pronted appt schedule for Feb...the patient aware...gv pt barium and advised pt central scheduling will contact with d/t for ct.

## 2012-10-26 ENCOUNTER — Ambulatory Visit: Payer: Managed Care, Other (non HMO)

## 2012-10-26 NOTE — Progress Notes (Signed)
[facility] [phone]   [phone]  OFFICE PROGRESS NOTE  [name], MD [address] [facility], [place]. [place] [address]  DIAGNOSIS: Metastatic non-small cell lung cancer adenocarcinoma, diagnosed in December 2010 in a patient with a remote history of smoking and positive EGFR mutation.   PRIOR THERAPY:  1. Status post palliative radiotherapy to the right hilum as well as anterior inferior ribs and lumbar spine from L2-L5 under the care Dr. Shon Baton.  2. status post right hip prophylactic trochanteric femoral nail and under the care of Dr. Eulas Post  3. palliative radiotherapy to his lumbar spine under the care of Dr.Wentworth   CURRENT THERAPY:  1. Tarceva at 150 mg by mouth daily status post 37 months of treatment  2. Xgeva 120 mg subcutaneously given on a monthly basis for bone metastasis status post 15 treatments    INTERVAL HISTORY: Makayla [name] 62 y.o. female returns to the clinic today for followup visit accompanied by her sister. Today she complains of increased swelling and now associated with pain and left-sided her neck. The pain radiates into the left anterior chest and under the left arm. She's also noted some difficulty with swallowing stating that her throat feels like it's swelling. She does report a past history of arthritis in her neck and is wondering if this is playing a role as well. The patient has been significant enough that she has had to increase her MS Contin to 30 mg in the morning due to the left neck pain she still takes 15 mg in the evening. She continues to take Vicodin for breakthrough pain and will need a new prescription for both the MS Contin and Vicodin. She has only occasional episodes of diarrhea related to the Tarceva which are manageable. She reports primarily dry skin related to the Tarceva is supposed to the acneform rash. She voiced no other specific complaints today. The  patient has occasional shortness breath with exertion. She denied having any significant weight loss or night sweats. She denied having any cough or hemoptysis. The upper respiratory illness that she had at her last visit has resolved however the left neck swelling has progressed.  MEDICAL HISTORY: Past Medical History  Diagnosis Date  . COPD (chronic obstructive pulmonary disease)   . Lung cancer     lung ca dx 12/10  . Lung cancer 09/01/2011    bone mets  . Hx of radiation therapy   . Hx antineoplastic chemotherapy     ALLERGIES:  is allergic to sulfonamide derivatives.  MEDICATIONS:  Current Outpatient Prescriptions  Medication Sig Dispense Refill  . ALPRAZolam (XANAX) 0.5 MG tablet Take 0.5 mg by mouth as needed.        . calcium-vitamin D (OSCAL WITH D) 500-200 MG-UNIT per tablet Take 1 tablet by mouth daily.        . clindamycin (CLEOCIN T) 1 % external solution APPLY TO AFFECTED AREA TWICE A DAY  60 mL  1  . denosumab (XGEVA) 120 MG/1.7ML SOLN Inject 120 mg into the skin every 30 (thirty) days.       . diphenhydramine-acetaminophen (TYLENOL PM) 25-500 MG TABS Take 1 tablet by mouth at bedtime as needed.      . docusate sodium (COLACE) 100 MG capsule Take 100 mg by mouth as needed.        . erlotinib (TARCEVA) 150 MG tablet Take 1 tablet (150 mg total) by mouth daily. Take  on an empty stomach 1 hour before meals or 2 hours after.  30 tablet  2  . ferrous sulfate 325 (65 FE) MG tablet Take 325 mg by mouth 2 (two) times daily.        Marland Kitchen HYDROcodone-acetaminophen (VICODIN) 5-500 MG per tablet TAKE 1 TABLET BY MOUTH EVERY 4 HOURS AS NEEDED  60 tablet  0  . Hydrocodone-Acetaminophen 5-300 MG TABS Take 1 tablet by mouth every 6 (six) hours as needed.  60 each  0  . Magnesium 250 MG TABS Take 1 tablet by mouth daily.      Marland Kitchen morphine (MS CONTIN) 15 MG 12 hr tablet Take 1 tablet by mouth every 8 hours  90 tablet  0  . [DISCONTINUED] warfarin (COUMADIN) 5 MG tablet Take 5 mg by mouth daily.           SURGICAL HISTORY:  Past Surgical History  Procedure Date  . Abdominal hysterectomy   . Surgery right leg, rod and pin     REVIEW OF SYSTEMS:  Pertinent items are noted in HPI.   PHYSICAL EXAMINATION: General appearance: alert, cooperative and no distress Head: Normocephalic, without obvious abnormality, atraumatic Neck:  There is now an elongated mass in the left anterior cervical chain area palpable beneath the neck musculature. It is somewhat firm and tender there is no overlying erythema or warmth. The mass is causing some asymmetry in the neck. Lymph nodes: Cervical adenopathy: Left mid cervical chain, now increased in size and questionably matted versus distinct mass Resp: clear to auscultation bilaterally Cardio: regular rate and rhythm, S1, S2 normal, no murmur, click, rub or gallop GI: soft, non-tender; bowel sounds normal; no masses,  no organomegaly Extremities: extremities normal, atraumatic, no cyanosis or edema Neurologic: Alert and oriented X 3, normal strength and tone. Normal symmetric reflexes. Normal coordination and gait  ECOG PERFORMANCE STATUS: 1 - Symptomatic but completely ambulatory  Blood pressure 126/83, pulse 110, temperature 97.5 F (36.4 C), temperature source Oral, resp. rate 18, height 5\' 7"  (1.702 m), weight 153 lb 8 oz (69.627 kg).  LABORATORY DATA: Lab Results  Component Value Date   WBC 5.6 10/25/2012   HGB 11.8 10/25/2012   HCT 34.0* 10/25/2012   MCV 91.3 10/25/2012   PLT 236 10/25/2012      Chemistry      Component Value Date/Time   NA 137 10/25/2012 1357   NA 136 05/09/2012 1321   NA 137 09/21/2011 0902   K 4.0 10/25/2012 1357   K 4.3 05/09/2012 1321   K 4.2 09/21/2011 0902   CL 103 10/25/2012 1357   CL 101 05/09/2012 1321   CL 101 09/21/2011 0902   CO2 25 10/25/2012 1357   CO2 24 05/09/2012 1321   CO2 28 09/21/2011 0902   BUN 12.0 10/25/2012 1357   BUN 12 05/09/2012 1321   BUN 16 09/21/2011 0902   CREATININE 0.8 10/25/2012 1357    CREATININE 0.72 05/09/2012 1321   CREATININE 1.0 09/21/2011 0902      Component Value Date/Time   CALCIUM 8.8 10/25/2012 1357   CALCIUM 9.0 05/09/2012 1321   CALCIUM 8.7 09/21/2011 0902   ALKPHOS 143 10/25/2012 1357   ALKPHOS 144* 05/09/2012 1321   ALKPHOS 130* 09/21/2011 0902   AST 19 10/25/2012 1357   AST 22 05/09/2012 1321   AST 27 09/21/2011 0902   ALT 15 10/25/2012 1357   ALT 16 05/09/2012 1321   BILITOT 0.53 10/25/2012 1357   BILITOT 0.5 05/09/2012 1321  BILITOT 0.60 09/21/2011 0902       RADIOGRAPHIC STUDIES: Ct Chest W Contrast  08/24/2012  *RADIOLOGY REPORT*  Clinical Data:  Restaging for lung carcinoma.  Previous radiation therapy.  CT CHEST, ABDOMEN AND PELVIS WITH CONTRAST  Technique:  Multidetector CT imaging of the chest, abdomen and pelvis was performed following the standard protocol during bolus administration of intravenous contrast.  Contrast: OMNIPAQUE IOHEXOL 300 MG/ML  SOLN  Comparison:  06/01/2012   CT CHEST  Findings:  There is persistent collapse of the right lower lobe.  A rim enhancing low attenuation mass is again seen centrally in the right infrahilar region which measures approximately 2.0 x 2.4 cm and is mildly increased size of this previously measured 1.9 x 2.1 cm.  A 7 mm right paratracheal mediastinal lymph node remains stable.  No new or increased areas of mediastinal lymphadenopathy are seen.  Compensatory hyperinflation of the left lung is again seen which remains clear.  No suspicious left lung nodules or masses are identified.  No evidence of pleural effusion.  No evidence of chest wall mass. Sclerotic bone lesions are again seen involving the left humeral neck and upper thoracic spine, consistent with sclerotic metastasis.  No new sclerotic bone metastases are identified within the thorax.  IMPRESSION:  1.  Mild increase in size of 2.4 cm mass in the right infrahilar region, with persistent right lower lobe collapse. 2.  Stable 7 mm right paratracheal  mediastinal lymph node. 3.  Stable sclerotic bone metastases.   CT ABDOMEN AND PELVIS  Findings:  The abdominal parenchymal organs including both adrenal glands are normal in appearance.  Gallbladder is unremarkable.  No evidence of hydronephrosis.  No soft tissue masses or lymphadenopathy identified elsewhere within the abdomen or pelvis.  Previous hysterectomy noted.  No evidence of inflammatory process or abnormal fluid collections.  No evidence of bowel wall thickening or dilatation.  Multiple sclerotic bone metastases are seen in the lumbar spine and pelvis, which show no significant change.  IMPRESSION:  1.  No evidence of soft tissue metastatic disease within the abdomen or pelvis.  2.  Stable appearance of sclerotic bone metastases.   Original Report Authenticated By: Myles Rosenthal, M.D.    ASSESSMENT/PLAN: This is a very pleasant 62 years old white female with metastatic non-small cell lung cancer, adenocarcinoma with positive EGFR mutation, currently on treatment with Tarceva and 50 mg by mouth daily for the last 3 years with no significant evidence for disease progression except for mildly increased right infrahilar nodule which is in the region of the collapsed right lower lobe. The patient was discussed with Dr. Truett Perna in Dr. Asa Lente absence. She will continue on Tarceva at 150 mg by mouth daily as well as monthly Xgeva injections. She will also continue on calcium and vitamin D and magnesium supplements. She was given a prescription for MS Contin 15 mg tablets, however we will increase this to one tablet every 8 hours or alternatively she can take 30 mg in the morning and 50 mg in the evening and she is been doing, total of 90 tablets with no refill. She was also given a prescription for Vicodin 5 301 every 6 hours as needed for breakthrough pain a total of 60 with no refill. To further evaluate the left neck swelling and pain the patient will be sent for a CT of the neck in the next few days and  we will arrange followup once we have the results of that study to  see what further treatment and management decisions need to be made. She will followup with [name] in one month for another symptom management visit with a restaging CT scan of her chest abdomen and pelvis with contrast to reevaluate her disease. She will have an injection appointment at that time as well for her Xgeva injection.    Makayla [name], Uriyah Massimo E, PA-C   All questions were answered. The patient knows to call the clinic with any problems, questions or concerns. We can certainly see the patient much sooner if necessary.  I spent 20 minutes counseling the patient face to face. The total time spent in the appointment was 30 minutes.

## 2012-11-02 ENCOUNTER — Ambulatory Visit (HOSPITAL_COMMUNITY)
Admission: RE | Admit: 2012-11-02 | Discharge: 2012-11-02 | Disposition: A | Discharge status: Home / Self Care | Payer: Managed Care, Other (non HMO) | Source: Ambulatory Visit | Attending: Physician Assistant | Admitting: Physician Assistant

## 2012-11-02 ENCOUNTER — Encounter (HOSPITAL_COMMUNITY): Payer: Self-pay

## 2012-11-02 DIAGNOSIS — J9819 Other pulmonary collapse: Secondary | ICD-10-CM | POA: Insufficient documentation

## 2012-11-02 DIAGNOSIS — C7951 Secondary malignant neoplasm of bone: Secondary | ICD-10-CM | POA: Insufficient documentation

## 2012-11-02 DIAGNOSIS — C7952 Secondary malignant neoplasm of bone marrow: Secondary | ICD-10-CM | POA: Insufficient documentation

## 2012-11-02 DIAGNOSIS — R221 Localized swelling, mass and lump, neck: Secondary | ICD-10-CM

## 2012-11-02 DIAGNOSIS — R599 Enlarged lymph nodes, unspecified: Secondary | ICD-10-CM | POA: Insufficient documentation

## 2012-11-02 DIAGNOSIS — C3490 Malignant neoplasm of unspecified part of unspecified bronchus or lung: Secondary | ICD-10-CM | POA: Insufficient documentation

## 2012-11-02 DIAGNOSIS — Z923 Personal history of irradiation: Secondary | ICD-10-CM | POA: Insufficient documentation

## 2012-11-02 DIAGNOSIS — R222 Localized swelling, mass and lump, trunk: Secondary | ICD-10-CM | POA: Insufficient documentation

## 2012-11-02 MED ORDER — IOHEXOL 300 MG/ML  SOLN
100.0000 mL | Freq: Once | INTRAMUSCULAR | Status: AC | PRN
  Administered 2012-11-02: 100 mL via INTRAVENOUS

## 2012-11-03 DIAGNOSIS — R490 Dysphonia: Secondary | ICD-10-CM

## 2012-11-03 HISTORY — DX: Dysphonia: R49.0

## 2012-11-06 ENCOUNTER — Encounter: Payer: Self-pay | Admitting: Physician Assistant

## 2012-11-06 ENCOUNTER — Other Ambulatory Visit: Payer: Self-pay | Admitting: Physician Assistant

## 2012-11-06 DIAGNOSIS — C3490 Malignant neoplasm of unspecified part of unspecified bronchus or lung: Secondary | ICD-10-CM

## 2012-11-06 NOTE — Progress Notes (Signed)
Phone patient and informed her of the positive CT of the neck showing 2 lymph nodes. She will be set up for a core biopsy of one of the lymph nodes with the tissue being sent for pathology/cytology as well as EGFR mutation and ALK gene translocation. The patient will be contacted by the radiology Department as to the specific date and time for this procedure. Patient voiced understanding. She has a followup appointment RD scheduled with [name] on 11/22/2012.  Laural Benes, Ferman Basilio E, PA-C

## 2012-11-07 ENCOUNTER — Other Ambulatory Visit: Payer: Self-pay | Admitting: Physician Assistant

## 2012-11-07 NOTE — Telephone Encounter (Signed)
Called patient to follow up on her pain control. Currently on MS Contin bid-tid and prn vicodin taking 2-3/day and staying comfortable. Due biopsy on 11/14/12. Has enough to last till Friday.

## 2012-11-08 ENCOUNTER — Encounter (HOSPITAL_COMMUNITY): Payer: Self-pay | Admitting: Pharmacy Technician

## 2012-11-12 ENCOUNTER — Other Ambulatory Visit: Payer: Self-pay | Admitting: Radiology

## 2012-11-14 ENCOUNTER — Encounter (HOSPITAL_COMMUNITY): Payer: Self-pay

## 2012-11-14 ENCOUNTER — Ambulatory Visit (HOSPITAL_COMMUNITY)
Admission: RE | Admit: 2012-11-14 | Discharge: 2012-11-14 | Disposition: A | Discharge status: Home / Self Care | Payer: Managed Care, Other (non HMO) | Source: Ambulatory Visit | Attending: Physician Assistant | Admitting: Physician Assistant

## 2012-11-14 ENCOUNTER — Other Ambulatory Visit: Payer: Self-pay | Admitting: Physician Assistant

## 2012-11-14 ENCOUNTER — Ambulatory Visit (HOSPITAL_COMMUNITY)
Admission: RE | Admit: 2012-11-14 | Discharge: 2012-11-14 | Disposition: A | Discharge status: Home / Self Care | Payer: Managed Care, Other (non HMO) | Source: Ambulatory Visit | Attending: Internal Medicine | Admitting: Internal Medicine

## 2012-11-14 DIAGNOSIS — C770 Secondary and unspecified malignant neoplasm of lymph nodes of head, face and neck: Secondary | ICD-10-CM | POA: Insufficient documentation

## 2012-11-14 DIAGNOSIS — C3490 Malignant neoplasm of unspecified part of unspecified bronchus or lung: Secondary | ICD-10-CM

## 2012-11-14 HISTORY — DX: Gastro-esophageal reflux disease without esophagitis: K21.9

## 2012-11-14 HISTORY — DX: Shortness of breath: R06.02

## 2012-11-14 LAB — CBC
HCT: 37 % (ref 36.0–46.0)
Hemoglobin: 12.3 g/dL (ref 12.0–15.0)
MCH: 30.2 pg (ref 26.0–34.0)
MCHC: 33.2 g/dL (ref 30.0–36.0)
MCV: 90.9 fL (ref 78.0–100.0)
Platelets: 281 10*3/uL (ref 150–400)
RBC: 4.07 MIL/uL (ref 3.87–5.11)
RDW: 12.3 % (ref 11.5–15.5)
WBC: 4.9 10*3/uL (ref 4.0–10.5)

## 2012-11-14 MED ORDER — MIDAZOLAM HCL 2 MG/2ML IJ SOLN
INTRAMUSCULAR | Status: AC | PRN
  Administered 2012-11-14 (×2): 1 mg via INTRAVENOUS

## 2012-11-14 MED ORDER — FENTANYL CITRATE 0.05 MG/ML IJ SOLN
INTRAMUSCULAR | Status: AC
  Filled 2012-11-14: qty 2

## 2012-11-14 MED ORDER — MIDAZOLAM HCL 2 MG/2ML IJ SOLN
INTRAMUSCULAR | Status: AC
  Filled 2012-11-14: qty 2

## 2012-11-14 MED ORDER — SODIUM CHLORIDE 0.9 % IV SOLN
INTRAVENOUS | Status: DC
  Administered 2012-11-14: 12:00:00 via INTRAVENOUS

## 2012-11-14 MED ORDER — FENTANYL CITRATE 0.05 MG/ML IJ SOLN
INTRAMUSCULAR | Status: AC | PRN
  Administered 2012-11-14: 100 ug via INTRAVENOUS

## 2012-11-14 NOTE — Procedures (Signed)
Technically successful US guided biopsy of enlarged left Cx lymph node.  No immediate complications.

## 2012-11-14 NOTE — H&P (Signed)
Makayla [name] is an 62 y.o. female.   Chief Complaint: Hx Lung Ca Neck pain Left jugular chain lymphadenopathy on CT Pt scheduled for lymph node biopsy today HPI: Lung Ca; COPD; GERD  Past Medical History  Diagnosis Date  . COPD (chronic obstructive pulmonary disease)   . Lung cancer     lung ca dx 12/10  . Lung cancer 09/01/2011    bone mets  . Hx of radiation therapy   . Hx antineoplastic chemotherapy   . Shortness of breath   . GERD (gastroesophageal reflux disease)     Past Surgical History  Procedure Laterality Date  . Abdominal hysterectomy    . Surgery right leg, rod and pin      Family History  Problem Relation Age of Onset  . Cancer Neg Hx    Social History:  reports that she has quit smoking. Her smoking use included Cigarettes. She smoked 0.00 packs per day for 4 years. She does not have any smokeless tobacco history on file. She reports that she does not drink alcohol or use illicit drugs.  Allergies:  Allergies  Allergen Reactions  . Sulfonamide Derivatives Nausea And Vomiting     (Not in a hospital admission)  Results for orders placed during the hospital encounter of 11/14/12 (from the past 48 hour(s))  CBC     Status: None   Collection Time    11/14/12 11:05 AM      Result Value Range   WBC 4.9  4.0 - 10.5 K/uL   RBC 4.07  3.87 - 5.11 MIL/uL   Hemoglobin 12.3  12.0 - 15.0 g/dL   HCT 96.0  45.4 - 09.8 %   MCV 90.9  78.0 - 100.0 fL   MCH 30.2  26.0 - 34.0 pg   MCHC 33.2  30.0 - 36.0 g/dL   RDW 11.9  14.7 - 82.9 %   Platelets 281  150 - 400 K/uL   No results found.  Review of Systems  Constitutional: Negative for fever and chills.  HENT: Positive for neck pain.   Respiratory: Negative for cough.   Cardiovascular: Negative for chest pain.  Gastrointestinal: Negative for nausea and vomiting.  Neurological: Negative for weakness and headaches.    There were no vitals taken for this visit. Physical Exam  Constitutional: She is oriented to  person, place, and time.  Cardiovascular: Normal rate, regular rhythm and normal heart sounds.   No murmur heard. Respiratory: Effort normal and breath sounds normal. She has no wheezes.  Rt atelectatic lung x 3 yrs after scarring from lung surgery  GI: Soft. Bowel sounds are normal. There is no tenderness.  Musculoskeletal: Normal range of motion.  Neurological: She is alert and oriented to person, place, and time.  Skin: Skin is warm and dry.  Psychiatric: She has a normal mood and affect. Her behavior is normal. Judgment and thought content normal.     Assessment/Plan Hx lung ca Neck pain CT reveals L jugular chain LAN Scheduled for bx now Pt aware of procedure benefits and risks and agreeable to proceed Consent signed and in chart  Tymel Conely A 11/14/2012, 11:47 AM

## 2012-11-17 ENCOUNTER — Other Ambulatory Visit: Payer: Self-pay

## 2012-11-20 ENCOUNTER — Telehealth: Payer: Self-pay | Admitting: Medical Oncology

## 2012-11-20 NOTE — Telephone Encounter (Signed)
Had L lymph node biopsy last wed . Nose started hurting on Sunday and yesterday her nose started swelling with redness and tenderness. Last night her temp  went up to 101 . I told her to call Dr Timothy Lasso and call me back if she cannot get in to see him.

## 2012-11-22 ENCOUNTER — Ambulatory Visit (HOSPITAL_BASED_OUTPATIENT_CLINIC_OR_DEPARTMENT_OTHER): Payer: Managed Care, Other (non HMO) | Admitting: Internal Medicine

## 2012-11-22 ENCOUNTER — Other Ambulatory Visit: Payer: Managed Care, Other (non HMO) | Admitting: Lab

## 2012-11-22 ENCOUNTER — Ambulatory Visit (HOSPITAL_BASED_OUTPATIENT_CLINIC_OR_DEPARTMENT_OTHER): Payer: Managed Care, Other (non HMO)

## 2012-11-22 ENCOUNTER — Other Ambulatory Visit: Payer: Self-pay | Admitting: Medical Oncology

## 2012-11-22 ENCOUNTER — Telehealth: Payer: Self-pay | Admitting: Internal Medicine

## 2012-11-22 VITALS — BP 119/79 | HR 120 | Temp 97.1°F | Resp 20 | Ht 67.0 in | Wt 147.3 lb

## 2012-11-22 DIAGNOSIS — C770 Secondary and unspecified malignant neoplasm of lymph nodes of head, face and neck: Secondary | ICD-10-CM

## 2012-11-22 DIAGNOSIS — C3490 Malignant neoplasm of unspecified part of unspecified bronchus or lung: Secondary | ICD-10-CM

## 2012-11-22 DIAGNOSIS — C7951 Secondary malignant neoplasm of bone: Secondary | ICD-10-CM

## 2012-11-22 DIAGNOSIS — C7952 Secondary malignant neoplasm of bone marrow: Secondary | ICD-10-CM

## 2012-11-22 DIAGNOSIS — C3430 Malignant neoplasm of lower lobe, unspecified bronchus or lung: Secondary | ICD-10-CM

## 2012-11-22 LAB — CBC WITH DIFFERENTIAL/PLATELET
BASO%: 0.1 % (ref 0.0–2.0)
Basophils Absolute: 0 10*3/uL (ref 0.0–0.1)
EOS%: 0.2 % (ref 0.0–7.0)
HCT: 34.1 % — ABNORMAL LOW (ref 34.8–46.6)
HGB: 11.4 g/dL — ABNORMAL LOW (ref 11.6–15.9)
LYMPH%: 5.7 % — ABNORMAL LOW (ref 14.0–49.7)
MCH: 30.1 pg (ref 25.1–34.0)
MCHC: 33.3 g/dL (ref 31.5–36.0)
MONO#: 0.6 10*3/uL (ref 0.1–0.9)
NEUT%: 88.5 % — ABNORMAL HIGH (ref 38.4–76.8)
Platelets: 255 10*3/uL (ref 145–400)

## 2012-11-22 LAB — COMPREHENSIVE METABOLIC PANEL (CC13)
ALT: 13 U/L (ref 0–55)
BUN: 13.3 mg/dL (ref 7.0–26.0)
CO2: 21 mEq/L — ABNORMAL LOW (ref 22–29)
Calcium: 9 mg/dL (ref 8.4–10.4)
Chloride: 103 mEq/L (ref 98–107)
Creatinine: 0.8 mg/dL (ref 0.6–1.1)
Potassium: 3.7 mEq/L (ref 3.5–5.1)
Total Bilirubin: 0.49 mg/dL (ref 0.20–1.20)

## 2012-11-22 MED ORDER — DENOSUMAB 120 MG/1.7ML ~~LOC~~ SOLN
120.0000 mg | Freq: Once | SUBCUTANEOUS | Status: AC
  Administered 2012-11-22: 120 mg via SUBCUTANEOUS
  Filled 2012-11-22: qty 1.7

## 2012-11-22 NOTE — Telephone Encounter (Signed)
Gave pt appt for lab, ML and injection on March 2014

## 2012-11-22 NOTE — Progress Notes (Signed)
[facility] [phone]   [phone]  OFFICE PROGRESS NOTE  [name], MD [address] [facility], [place]. 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  Marland Kitchen   PRIOR THERAPY:  1. Status post palliative radiotherapy to the right hilum as well as anterior inferior ribs and lumbar spine from L2-L5 under the care Dr. Shon Baton.  2. status post right hip prophylactic trochanteric femoral nail and under the care of Dr. Eulas Post  3. palliative radiotherapy to his lumbar spine under the care of Dr.Wentworth   CURRENT THERAPY:  1. Tarceva at 150 mg by mouth daily status post 37 months of treatment  2. Xgeva 120 mg subcutaneously given on a monthly basis for bone metastasis status post 15 treatments    INTERVAL HISTORY: Makayla [name] 62 y.o. female returns to the clinic today for followup visit accompanied by her son and sister. The patient is feeling fine today with no specific complaints except for redness in her nose and occasional back pain in addition to the swelling in a  lower left cervical lymph node of the neck. She underwent ultrasound-guided left cervical lymph node biopsy by interventional radiology on 11/14/2012 and the final pathology was consistent with metastatic adenocarcinoma. The tissue blocks were sent for EGFR mutation as well as ALK gene translocation. The ALK test was reported to be negative but the EGFR mutation is still pending. She had repeat CT scan of the neck, chest, abdomen and pelvis performed recently and she is here for evaluation and discussion of her scan results and recommendation regarding treatment of her condition. She continues to have mild fatigue and shortness breath with exertion.  MEDICAL HISTORY: Past Medical History    Diagnosis Date  . COPD (chronic obstructive pulmonary disease)   . Lung cancer     lung ca dx 12/10  . Lung cancer 09/01/2011    bone mets  . Hx of radiation therapy   . Hx antineoplastic chemotherapy   . Shortness of breath   . GERD (gastroesophageal reflux disease)     ALLERGIES:  is allergic to sulfonamide derivatives.  MEDICATIONS:  Current Outpatient Prescriptions  Medication Sig Dispense Refill  . ALPRAZolam (XANAX) 0.5 MG tablet Take 0.5 mg by mouth at bedtime as needed. For sleep      . calcium-vitamin D (OSCAL WITH D) 500-200 MG-UNIT per tablet Take 1 tablet by mouth daily.        . clindamycin (CLEOCIN T) 1 % external solution Apply 1 application topically 2 (two) times daily as needed. Apply as needed to scalp      . denosumab (XGEVA) 120 MG/1.7ML SOLN Inject 120 mg into the skin every 30 (thirty) days.       . diphenhydramine-acetaminophen (TYLENOL PM) 25-500 MG TABS Take 1 tablet by mouth at bedtime as needed.      . docusate sodium (COLACE) 100 MG capsule Take 200 mg by mouth at bedtime.       . erlotinib (TARCEVA) 150 MG tablet Take 1 tablet (150 mg total) by mouth daily. Take on an empty stomach 1 hour before meals or 2 hours after.  30 tablet  2  . ferrous sulfate 325 (65 FE) MG tablet Take 325 mg by mouth 2 (two) times daily.        Marland Kitchen  Hydrocodone-Acetaminophen (VICODIN) 5-300 MG TABS Take 1 tablet by mouth every 4 (four) hours as needed. For pain      . Magnesium 250 MG TABS Take 1 tablet by mouth daily.      Marland Kitchen morphine (MS CONTIN) 15 MG 12 hr tablet Take 1 tablet by mouth every 8 hours  90 tablet  0  . [DISCONTINUED] warfarin (COUMADIN) 5 MG tablet Take 5 mg by mouth daily.         No current facility-administered medications for this visit.    SURGICAL HISTORY:  Past Surgical History  Procedure Laterality Date  . Abdominal hysterectomy    . Surgery right leg, rod and pin      REVIEW OF SYSTEMS:  A comprehensive review of systems was negative except for:  Constitutional: positive for fatigue Respiratory: positive for dyspnea on exertion Musculoskeletal: positive for back pain   PHYSICAL EXAMINATION: General appearance: alert, cooperative and no distress Head: Normocephalic, without obvious abnormality, atraumatic Neck: Palpable a small left cervical lymph node around 2 CM in size. Lymph nodes: Cervical, supraclavicular, and axillary nodes normal. and Palpable left cervical lymph node Resp: Absent breath sounds and dullness to percussion in the right lung field Cardio: regular rate and rhythm, S1, S2 normal, no murmur, click, rub or gallop GI: soft, non-tender; bowel sounds normal; no masses,  no organomegaly Extremities: extremities normal, atraumatic, no cyanosis or edema Neurologic: Alert and oriented X 3, normal strength and tone. Normal symmetric reflexes. Normal coordination and gait  ECOG PERFORMANCE STATUS: 1 - Symptomatic but completely ambulatory  There were no vitals taken for this visit.  LABORATORY DATA: Lab Results  Component Value Date   WBC 10.2 11/22/2012   HGB 11.4* 11/22/2012   HCT 34.1* 11/22/2012   MCV 90.4 11/22/2012   PLT 255 11/22/2012      Chemistry      Component Value Date/Time   NA 137 10/25/2012 1357   NA 136 05/09/2012 1321   NA 137 09/21/2011 0902   K 4.0 10/25/2012 1357   K 4.3 05/09/2012 1321   K 4.2 09/21/2011 0902   CL 103 10/25/2012 1357   CL 101 05/09/2012 1321   CL 101 09/21/2011 0902   CO2 25 10/25/2012 1357   CO2 24 05/09/2012 1321   CO2 28 09/21/2011 0902   BUN 12.0 10/25/2012 1357   BUN 12 05/09/2012 1321   BUN 16 09/21/2011 0902   CREATININE 0.8 10/25/2012 1357   CREATININE 0.72 05/09/2012 1321   CREATININE 1.0 09/21/2011 0902      Component Value Date/Time   CALCIUM 8.8 10/25/2012 1357   CALCIUM 9.0 05/09/2012 1321   CALCIUM 8.7 09/21/2011 0902   ALKPHOS 143 10/25/2012 1357   ALKPHOS 144* 05/09/2012 1321   ALKPHOS 130* 09/21/2011 0902   AST 19 10/25/2012 1357   AST 22 05/09/2012 1321   AST 27  09/21/2011 0902   ALT 15 10/25/2012 1357   ALT 16 05/09/2012 1321   BILITOT 0.53 10/25/2012 1357   BILITOT 0.5 05/09/2012 1321   BILITOT 0.60 09/21/2011 0902       RADIOGRAPHIC STUDIES: Ct Soft Tissue Neck W Contrast  11/02/2012  *RADIOLOGY REPORT*  Clinical Data: Painful left neck mass.  Lung cancer.  CT NECK WITH CONTRAST  Technique:  Multidetector CT imaging of the neck was performed with intravenous contrast.  Contrast: OMNIPAQUE IOHEXOL 300 MG/ML  SOLN  Comparison: CT chest abdomen pelvis 09/21/2011.  Findings: Limited imaging of the brain is unremarkable.  A left level III peripherally enhancing lymph node measures 14 x 15 x 16 mm.  The left jugulodigastric lymph node appears be within normal limits, measuring 20 mm in maximal length.  No other enlarged cervical lymph nodes are present.  No significant right- sided adenopathy is present.  A soft tissue nodule below the thyroid is increased in size since the prior study.  This lesion now measures 13 x 13 x 13 mm.  The mediastinum shifted to the right, compatible with the patient's pneumonectomy.  The C5 vertebral body demonstrates diffuse sclerosis compatible with metastasis.  There is sclerosis in the posterior elements at T2.  Sclerotic changes are noted in the posterior elements at C6 and C4.  Degenerative changes are present at C3-4 with right greater than left osseous foraminal narrowing.  IMPRESSION:  1. 16 mm left level III peripherally enhancing and centrally necrotic lymph node is most compatible with metastatic disease. 2.  Osseous metastases at C4, C5, C6, and T2. 3.  A prominent left sided jugulodigastric lymph node is not clearly represent metastatic disease. 4.  Enlarging soft tissue nodule just below the thyroid likely represents a second metastatic node, measuring 13 mm.   Original Report Authenticated By: Marin Roberts, M.D.    Ct Chest W Contrast  11/02/2012  *RADIOLOGY REPORT*  Clinical Data:  Restaging lung carcinoma.   Metastatic non-small cell lung cancer status post radiation to the right hilum as well as the lumbar spine.  CT CHEST, ABDOMEN AND PELVIS WITH CONTRAST  Technique:  Multidetector CT imaging of the chest, abdomen and pelvis was performed following the standard protocol during bolus administration of intravenous contrast.  Contrast: OMNIPAQUE IOHEXOL 300 MG/ML  SOLN  Comparison:  CT 08/24/2012   CT CHEST  Findings:  Interval increase in size of the right lower lobe mass imbedded within the atelectatic right lower lobe.  Mass has ill- defined margins  but measures approximately 3.2 x 3.5 cm increased from 2.4 x 2.0 cm on prior.  The right lower lobe bronchus is obliterated.  No mediastinal lymphadenopathy.  There is a right paratracheal lymph node measuring 7 mm which is unchanged from prior.  No evidence of pulmonary nodule within the expanded left lung. Fluid the esophagus is similar to prior.  No axillary or supraclavicular adenopathy.  There is a lymph node in the upper mediastinum just inferior to the thyroid gland measuring 14 mm (image #11).  This is not significant changed from 14 mm on prior although the lesion does appear slightly more rounded.  IMPRESSION:  1.  Interval increase size of mass within the atelectatic right lower lobe. 2.   Stable enlarged high paratracheal lymph node.   CT ABDOMEN AND PELVIS  Findings:  There is no focal hepatic lesion.  Gallbladder, pancreas, spleen, adrenal glands, and kidneys are normal.  The stomach, small bowel, and colon are normal.  Abdominal aorta is normal caliber.  No retroperitoneal periportal lymphadenopathy.  No free fluid the pelvis.  The uterus is been removed.  The bladder is normal.  No pelvic lymphadenopathy.  Review of the bone windows demonstrate extensive sclerotic change within the pelvis, sacrum and lumbar spine consistent with treated metastasis.  Similar sclerotic lesions in the left humeral head and upper thoracic/cervical spine.  IMPRESSION:   1.  No evidence of metastasis in the soft tissues of the abdomen or  pelvis. 2.  Stable sclerotic metastasis within the pelvis, spine, and left shoulder.   Original Report Authenticated By: Genevive Bi,  M.D.    ASSESSMENT: this is a very pleasant 62 years old white female with metastatic non-small cell lung cancer with positive EGFR mutation and has been on treatment with Tarceva since December of 2010 but unfortunately the patient has some evidence for disease progression in the right lower lobe lung mass as well as the left cervical lymphadenopathy.  PLAN: I had a lengthy discussion with the patient and her family today about her current disease status and treatment options. I discussed with the patient several options for her treatment including continuing treatment with Tarceva but consideration of palliative radiotherapy to the left cervical lymph node versus switching treatment to the new tyrosine kinase inhibitor Gilotrif which is indicated for patients with positive EGFR mutation. The patient would like to consider treatment with Gilotrif. I discussed with her the adverse effect of this treatment is specifically skin rash and diarrhea. She was also given him handouts and starter kit for Gilotrif. The patient still has 2 weeks of Tarceva tablets at home. I recommended for her to finish the Tarceva tablets at home and then we'll switch her to Gilotrif. She would come back for followup visit in one month's for reevaluation and management any adverse effect of the new therapy  All questions were answered. The patient knows to call the clinic with any problems, questions or concerns. We can certainly see the patient much sooner if necessary.  I spent 20 minutes counseling the patient face to face. The total time spent in the appointment was 30 minutes.

## 2012-11-23 ENCOUNTER — Other Ambulatory Visit: Payer: Self-pay | Admitting: Internal Medicine

## 2012-11-23 ENCOUNTER — Encounter: Payer: Self-pay | Admitting: Internal Medicine

## 2012-11-23 ENCOUNTER — Other Ambulatory Visit: Payer: Self-pay | Admitting: Medical Oncology

## 2012-11-23 ENCOUNTER — Ambulatory Visit: Payer: Managed Care, Other (non HMO)

## 2012-11-23 DIAGNOSIS — C3490 Malignant neoplasm of unspecified part of unspecified bronchus or lung: Secondary | ICD-10-CM

## 2012-11-23 MED ORDER — MORPHINE SULFATE ER 15 MG PO TBCR: EXTENDED_RELEASE_TABLET | ORAL | Status: DC

## 2012-11-23 NOTE — Patient Instructions (Signed)
Your scan showed some evidence for disease progression.  We discussed treatment options including continuing treatment with Tarceva in addition to palliative radiotherapy versus switched to Gilotrif. You prefer to switch to Gilotrif at this point. Followup in one month

## 2012-11-23 NOTE — Telephone Encounter (Signed)
Pt needs refill for morphine .Authorizized and locked in injection room for pickup

## 2012-11-26 ENCOUNTER — Telehealth: Payer: Self-pay | Admitting: *Deleted

## 2012-11-26 NOTE — Telephone Encounter (Signed)
EGFR mutation detected in bopsy of the lymph node.  Results given to Dr Donnald Garre to review.  SLJ

## 2012-12-05 ENCOUNTER — Telehealth: Payer: Self-pay | Admitting: Medical Oncology

## 2012-12-05 ENCOUNTER — Other Ambulatory Visit: Payer: Self-pay | Admitting: Internal Medicine

## 2012-12-05 MED ORDER — AFATINIB DIMALEATE 40 MG PO TABS: 40.0000 mg | ORAL_TABLET | Freq: Every day | ORAL | Status: DC

## 2012-12-05 MED ORDER — MORPHINE SULFATE ER 30 MG PO TBCR: EXTENDED_RELEASE_TABLET | ORAL | Status: DC

## 2012-12-05 NOTE — Telephone Encounter (Signed)
Reports increased pain . Per Dr Arbutus Ped he increased her morphine and ordered gilotrif-pt notifed and rx locked up in injection room.

## 2012-12-06 ENCOUNTER — Other Ambulatory Visit: Payer: Self-pay | Admitting: Internal Medicine

## 2012-12-07 ENCOUNTER — Telehealth: Payer: Self-pay | Admitting: *Deleted

## 2012-12-07 ENCOUNTER — Other Ambulatory Visit: Payer: Self-pay | Admitting: *Deleted

## 2012-12-07 NOTE — Telephone Encounter (Signed)
Received call from pt wanting to know if rx for Hydrocodone was called to [facility] pharmacy.  RN noted that rx was written on 12/06/12 by md.   Pt also has rx for MS Contin left with injection nurse for pick up.  Called pt back and informed pt of above info.  Pt voiced understanding. Pt's  Phone    [phone].

## 2012-12-10 ENCOUNTER — Other Ambulatory Visit: Payer: Self-pay | Admitting: Medical Oncology

## 2012-12-10 NOTE — Telephone Encounter (Signed)
Pt did not get hydrocodone refill -last week pharmacy did not have authorization . I called in refill and told her MS contin was written for every 8 hours

## 2012-12-11 ENCOUNTER — Telehealth: Payer: Self-pay | Admitting: Medical Oncology

## 2012-12-11 MED ORDER — AFATINIB DIMALEATE 40 MG PO TABS: 40.0000 mg | ORAL_TABLET | Freq: Every day | ORAL | Status: DC

## 2012-12-11 NOTE — Telephone Encounter (Signed)
Pt stated her gilotrif rx has to go to [facility] pharmacy. Sent to Dr Arbutus Ped for authorization

## 2012-12-12 NOTE — Telephone Encounter (Signed)
Faxed order and confirmed receipt of gilotrif to [facility]

## 2012-12-20 ENCOUNTER — Ambulatory Visit (HOSPITAL_BASED_OUTPATIENT_CLINIC_OR_DEPARTMENT_OTHER): Payer: Managed Care, Other (non HMO)

## 2012-12-20 ENCOUNTER — Other Ambulatory Visit (HOSPITAL_BASED_OUTPATIENT_CLINIC_OR_DEPARTMENT_OTHER): Payer: Managed Care, Other (non HMO) | Admitting: Lab

## 2012-12-20 ENCOUNTER — Telehealth: Payer: Self-pay | Admitting: Internal Medicine

## 2012-12-20 ENCOUNTER — Encounter: Payer: Self-pay | Admitting: Physician Assistant

## 2012-12-20 ENCOUNTER — Ambulatory Visit (HOSPITAL_BASED_OUTPATIENT_CLINIC_OR_DEPARTMENT_OTHER): Payer: Managed Care, Other (non HMO) | Admitting: Physician Assistant

## 2012-12-20 DIAGNOSIS — C7952 Secondary malignant neoplasm of bone marrow: Secondary | ICD-10-CM

## 2012-12-20 DIAGNOSIS — C3430 Malignant neoplasm of lower lobe, unspecified bronchus or lung: Secondary | ICD-10-CM

## 2012-12-20 DIAGNOSIS — C7951 Secondary malignant neoplasm of bone: Secondary | ICD-10-CM

## 2012-12-20 DIAGNOSIS — C3490 Malignant neoplasm of unspecified part of unspecified bronchus or lung: Secondary | ICD-10-CM

## 2012-12-20 DIAGNOSIS — R599 Enlarged lymph nodes, unspecified: Secondary | ICD-10-CM

## 2012-12-20 LAB — CBC WITH DIFFERENTIAL/PLATELET
BASO%: 0.3 % (ref 0.0–2.0)
Basophils Absolute: 0 10*3/uL (ref 0.0–0.1)
Eosinophils Absolute: 0.1 10*3/uL (ref 0.0–0.5)
HCT: 32.6 % — ABNORMAL LOW (ref 34.8–46.6)
HGB: 11.1 g/dL — ABNORMAL LOW (ref 11.6–15.9)
LYMPH%: 19 % (ref 14.0–49.7)
MCHC: 33.9 g/dL (ref 31.5–36.0)
MONO#: 0.4 10*3/uL (ref 0.1–0.9)
NEUT%: 69.6 % (ref 38.4–76.8)
Platelets: 214 10*3/uL (ref 145–400)
WBC: 4.3 10*3/uL (ref 3.9–10.3)
lymph#: 0.8 10*3/uL — ABNORMAL LOW (ref 0.9–3.3)

## 2012-12-20 LAB — COMPREHENSIVE METABOLIC PANEL (CC13)
ALT: 13 U/L (ref 0–55)
BUN: 16.9 mg/dL (ref 7.0–26.0)
CO2: 26 mEq/L (ref 22–29)
Calcium: 9 mg/dL (ref 8.4–10.4)
Chloride: 102 mEq/L (ref 98–107)
Creatinine: 0.8 mg/dL (ref 0.6–1.1)
Glucose: 111 mg/dl — ABNORMAL HIGH (ref 70–99)
Total Bilirubin: 0.24 mg/dL (ref 0.20–1.20)

## 2012-12-20 MED ORDER — DENOSUMAB 120 MG/1.7ML ~~LOC~~ SOLN
120.0000 mg | Freq: Once | SUBCUTANEOUS | Status: AC
Start: 2012-12-20 — End: 2012-12-20
  Administered 2012-12-20: 120 mg via SUBCUTANEOUS
  Filled 2012-12-20: qty 1.7

## 2012-12-20 NOTE — Patient Instructions (Addendum)
Continue taking Gilotrif 40 mg by mouth daily Followup in 2 weeks for another symptom management visit

## 2012-12-20 NOTE — Patient Instructions (Addendum)
Denosumab injection What is this medicine? DENOSUMAB slows bone breakdown. It is used to treat osteoporosis in women after menopause and in men. This medicine is also used to prevent bone fractures and other bone problems caused by cancer bone metastases. This medicine may be used for other purposes; ask your health care provider or pharmacist if you have questions. What should I tell my health care provider before I take this medicine? They need to know if you have any of these conditions: -dental disease -eczema -infection or history of infections -kidney disease or on dialysis -low blood calcium or vitamin D -malabsorption syndrome -scheduled to have surgery or tooth extraction -taking medicine that contains denosumab -thyroid or parathyroid disease -an unusual reaction to denosumab, other medicines, foods, dyes, or preservatives -pregnant or trying to get pregnant -breast-feeding How should I use this medicine? This medicine is for injection under the skin. It is given by a health care professional in a hospital or clinic setting. If you are getting Prolia, a special MedGuide will be given to you by the pharmacist with each prescription and refill. Be sure to read this information carefully each time. Talk to your pediatrician regarding the use of this medicine in children. Special care may be needed. Overdosage: If you think you've taken too much of this medicine contact a poison control center or emergency room at once. Overdosage: If you think you have taken too much of this medicine contact a poison control center or emergency room at once. NOTE: This medicine is only for you. Do not share this medicine with others. What if I miss a dose? It is important not to miss your dose. Call your doctor or health care professional if you are unable to keep an appointment. What may interact with this medicine? Do not take this medicine with any of the following medications: -other medicines  containing denosumab This medicine may also interact with the following medications: -medicines that suppress the immune system -medicines that treat cancer -steroid medicines like prednisone or cortisone This list may not describe all possible interactions. Give your health care provider a list of all the medicines, herbs, non-prescription drugs, or dietary supplements you use. Also tell them if you smoke, drink alcohol, or use illegal drugs. Some items may interact with your medicine. What should I watch for while using this medicine? Visit your doctor or health care professional for regular checks on your progress. Your doctor or health care professional may order blood tests and other tests to see how you are doing. Call your doctor or health care professional if you get a cold or other infection while receiving this medicine. Do not treat yourself. This medicine may decrease your body's ability to fight infection. You should make sure you get enough calcium and vitamin D while you are taking this medicine, unless your doctor tells you not to. Discuss the foods you eat and the vitamins you take with your health care professional. See your dentist regularly. Brush and floss your teeth as directed. Before you have any dental work done, tell your dentist you are receiving this medicine. What side effects may I notice from receiving this medicine? Side effects that you should report to your doctor or health care professional as soon as possible: -allergic reactions like skin rash, itching or hives, swelling of the face, lips, or tongue -breathing problems -chest pain -fast, irregular heartbeat -feeling faint or lightheaded, falls -fever, chills, or any other sign of infection -muscle spasms, tightening, or twitches -numbness   or tingling -skin blisters or bumps, or is dry, peels, or red -slow healing or unexplained pain in the mouth or jaw -unusual bleeding or bruising Side effects that  usually do not require medical attention (Report these to your doctor or health care professional if they continue or are bothersome.): -muscle pain -stomach upset, gas This list may not describe all possible side effects. Call your doctor for medical advice about side effects. You may report side effects to FDA at 1-800-FDA-1088. Where should I keep my medicine? This medicine is only given in a clinic, doctor's office, or other health care setting and will not be stored at home. NOTE: This sheet is a summary. It may not cover all possible information. If you have questions about this medicine, talk to your doctor, pharmacist, or health care provider.  2013, [employer]. (06/28/2011 3:40:41 PM)  

## 2012-12-20 NOTE — Progress Notes (Signed)
[facility] [phone]   [phone]  OFFICE PROGRESS NOTE  [name], MD [address] [facility] [place], [place]. 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  Marland Kitchen   PRIOR THERAPY:  1. Status post palliative radiotherapy to the right hilum as well as anterior inferior ribs and lumbar spine from L2-L5 under the care Dr. Shon Baton.  2. status post right hip prophylactic trochanteric femoral nail and under the care of Dr. Eulas Post  3. palliative radiotherapy to his lumbar spine under the care of Dr.Wentworth  4. Tarceva at 150 mg by mouth daily status post 37 months of treatment   CURRENT THERAPY:  1. Gilotrif 40 mg by mouth daily. Therapy started 12/17/2012 2. Xgeva 120 mg subcutaneously given on a monthly basis for bone metastasis status post 16 treatments    INTERVAL HISTORY: Makayla [name] 62 y.o. female returns to the clinic today for followup visit accompanied by her son and sister. She continues to have discomfort due to the swelling in the left lower cervical lymph node of the neck. She's also noticed progressively worsening hoarseness of voice. This is problematic if she uses her voice at work. She'll sometimes have coughing fits  That are called with throat lozenges however it does not effect the level of hoarseness. She's also tried hot tea with honey and lemon or hot ginger tea without relief of hoarseness.She underwent ultrasound-guided left cervical lymph node biopsy by interventional radiology on 11/14/2012 and the final pathology was consistent with metastatic adenocarcinoma. The tissue blocks were sent for EGFR mutation as well as ALK gene translocation. The ALK test was reported to be negative t the EGFR mutation was positive on exon 21 [id number].  She  continues to have mild fatigue and shortness breath with exertion. Her pain is currently well-controlled with MS Contin 30 mg by mouth every 8 hours and Vicodin 2-3 tablets throughout the day as needed for breakthrough pain.  MEDICAL HISTORY: Past Medical History  Diagnosis Date  . COPD (chronic obstructive pulmonary disease)   . Lung cancer     lung ca dx 12/10  . Lung cancer 09/01/2011    bone mets  . Hx of radiation therapy   . Hx antineoplastic chemotherapy   . Shortness of breath   . GERD (gastroesophageal reflux disease)     ALLERGIES:  is allergic to sulfonamide derivatives.  MEDICATIONS:  Current Outpatient Prescriptions  Medication Sig Dispense Refill  . afatinib dimaleate (GILOTRIF) 40 MG tablet Take 1 tablet (40 mg total) by mouth daily. Take on an empty stomach 1hr before or 2 hrs after meals.  30 tablet  1  . ALPRAZolam (XANAX) 0.5 MG tablet Take 0.5 mg by mouth at bedtime as needed. For sleep      . calcium-vitamin D (OSCAL WITH D) 500-200 MG-UNIT per tablet Take 1 tablet by mouth daily.        . clindamycin (CLEOCIN T) 1 % external solution Apply 1 application topically 2 (two) times daily as needed. Apply as needed to scalp      . denosumab (XGEVA) 120 MG/1.7ML SOLN Inject 120 mg into the skin every 30 (thirty) days.       . diphenhydramine-acetaminophen (TYLENOL PM) 25-500 MG TABS Take 1 tablet by mouth at bedtime as needed.      Marland Kitchen  docusate sodium (COLACE) 100 MG capsule Take 200 mg by mouth at bedtime.       . ferrous sulfate 325 (65 FE) MG tablet Take 325 mg by mouth 2 (two) times daily.        . Hydrocodone-Acetaminophen 5-300 MG TABS TAKE 1 TABLET BY MOUTH EVERY 6 HOURS AS NEEDED FOR PAIN  60 each  0  . Magnesium 250 MG TABS Take 1 tablet by mouth daily.      Marland Kitchen morphine (MS CONTIN) 30 MG 12 hr tablet Take 1 tablet by mouth every 8 hours  90 tablet  0  . [DISCONTINUED] warfarin (COUMADIN) 5 MG tablet Take 5 mg by mouth daily.         No current  facility-administered medications for this visit.    SURGICAL HISTORY:  Past Surgical History  Procedure Laterality Date  . Abdominal hysterectomy    . Surgery right leg, rod and pin      REVIEW OF SYSTEMS:  A comprehensive review of systems was negative except for: Constitutional: positive for fatigue Ears, nose, mouth, throat, and face: positive for hoarseness and Left-sided neck pain related to the swelling of the left cervical lymph nodes Respiratory: positive for dyspnea on exertion Musculoskeletal: positive for back pain   PHYSICAL EXAMINATION: General appearance: alert, cooperative and no distress Head: Normocephalic, without obvious abnormality, atraumatic Neck: Palpable a small left cervical lymph node around 2 CM in size. Lymph nodes: Cervical, supraclavicular, and axillary nodes normal. and Palpable left cervical lymph node Resp: Absent breath sounds and dullness to percussion in the right lung field Cardio: regular rate and rhythm, S1, S2 normal, no murmur, click, rub or gallop GI: soft, non-tender; bowel sounds normal; no masses,  no organomegaly Extremities: extremities normal, atraumatic, no cyanosis or edema Neurologic: Alert and oriented X 3, normal strength and tone. Normal symmetric reflexes. Normal coordination and gait  ECOG PERFORMANCE STATUS: 1 - Symptomatic but completely ambulatory  Blood pressure 124/84, pulse 114, temperature 97.2 F (36.2 C), temperature source Oral, resp. rate 18, height 5\' 7"  (1.702 m), weight 152 lb 4.8 oz (69.083 kg).  LABORATORY DATA: Lab Results  Component Value Date   WBC 4.3 12/20/2012   HGB 11.1* 12/20/2012   HCT 32.6* 12/20/2012   MCV 89.4 12/20/2012   PLT 214 12/20/2012      Chemistry      Component Value Date/Time   NA 137 12/20/2012 1441   NA 136 05/09/2012 1321   NA 137 09/21/2011 0902   K 4.3 12/20/2012 1441   K 4.3 05/09/2012 1321   K 4.2 09/21/2011 0902   CL 102 12/20/2012 1441   CL 101 05/09/2012 1321   CL 101  09/21/2011 0902   CO2 26 12/20/2012 1441   CO2 24 05/09/2012 1321   CO2 28 09/21/2011 0902   BUN 16.9 12/20/2012 1441   BUN 12 05/09/2012 1321   BUN 16 09/21/2011 0902   CREATININE 0.8 12/20/2012 1441   CREATININE 0.72 05/09/2012 1321   CREATININE 1.0 09/21/2011 0902      Component Value Date/Time   CALCIUM 9.0 12/20/2012 1441   CALCIUM 9.0 05/09/2012 1321   CALCIUM 8.7 09/21/2011 0902   ALKPHOS 129 12/20/2012 1441   ALKPHOS 144* 05/09/2012 1321   ALKPHOS 130* 09/21/2011 0902   AST 20 12/20/2012 1441   AST 22 05/09/2012 1321   AST 27 09/21/2011 0902   ALT 13 12/20/2012 1441   ALT 16 05/09/2012 1321   BILITOT 0.24 12/20/2012 1441  BILITOT 0.5 05/09/2012 1321   BILITOT 0.60 09/21/2011 0902       RADIOGRAPHIC STUDIES: Ct Soft Tissue Neck W Contrast  11/02/2012  *RADIOLOGY REPORT*  Clinical Data: Painful left neck mass.  Lung cancer.  CT NECK WITH CONTRAST  Technique:  Multidetector CT imaging of the neck was performed with intravenous contrast.  Contrast: OMNIPAQUE IOHEXOL 300 MG/ML  SOLN  Comparison: CT chest abdomen pelvis 09/21/2011.  Findings: Limited imaging of the brain is unremarkable.  A left level III peripherally enhancing lymph node measures 14 x 15 x 16 mm.  The left jugulodigastric lymph node appears be within normal limits, measuring 20 mm in maximal length.  No other enlarged cervical lymph nodes are present.  No significant right- sided adenopathy is present.  A soft tissue nodule below the thyroid is increased in size since the prior study.  This lesion now measures 13 x 13 x 13 mm.  The mediastinum shifted to the right, compatible with the patient's pneumonectomy.  The C5 vertebral body demonstrates diffuse sclerosis compatible with metastasis.  There is sclerosis in the posterior elements at T2.  Sclerotic changes are noted in the posterior elements at C6 and C4.  Degenerative changes are present at C3-4 with right greater than left osseous foraminal narrowing.  IMPRESSION:  1. 16 mm  left level III peripherally enhancing and centrally necrotic lymph node is most compatible with metastatic disease. 2.  Osseous metastases at C4, C5, C6, and T2. 3.  A prominent left sided jugulodigastric lymph node is not clearly represent metastatic disease. 4.  Enlarging soft tissue nodule just below the thyroid likely represents a second metastatic node, measuring 13 mm.   Original Report Authenticated By: Marin Roberts, M.D.    Ct Chest W Contrast  11/02/2012  *RADIOLOGY REPORT*  Clinical Data:  Restaging lung carcinoma.  Metastatic non-small cell lung cancer status post radiation to the right hilum as well as the lumbar spine.  CT CHEST, ABDOMEN AND PELVIS WITH CONTRAST  Technique:  Multidetector CT imaging of the chest, abdomen and pelvis was performed following the standard protocol during bolus administration of intravenous contrast.  Contrast: OMNIPAQUE IOHEXOL 300 MG/ML  SOLN  Comparison:  CT 08/24/2012   CT CHEST  Findings:  Interval increase in size of the right lower lobe mass imbedded within the atelectatic right lower lobe.  Mass has ill- defined margins  but measures approximately 3.2 x 3.5 cm increased from 2.4 x 2.0 cm on prior.  The right lower lobe bronchus is obliterated.  No mediastinal lymphadenopathy.  There is a right paratracheal lymph node measuring 7 mm which is unchanged from prior.  No evidence of pulmonary nodule within the expanded left lung. Fluid the esophagus is similar to prior.  No axillary or supraclavicular adenopathy.  There is a lymph node in the upper mediastinum just inferior to the thyroid gland measuring 14 mm (image #11).  This is not significant changed from 14 mm on prior although the lesion does appear slightly more rounded.  IMPRESSION:  1.  Interval increase size of mass within the atelectatic right lower lobe. 2.   Stable enlarged high paratracheal lymph node.   CT ABDOMEN AND PELVIS  Findings:  There is no focal hepatic lesion.  Gallbladder,  pancreas, spleen, adrenal glands, and kidneys are normal.  The stomach, small bowel, and colon are normal.  Abdominal aorta is normal caliber.  No retroperitoneal periportal lymphadenopathy.  No free fluid the pelvis.  The uterus is  been removed.  The bladder is normal.  No pelvic lymphadenopathy.  Review of the bone windows demonstrate extensive sclerotic change within the pelvis, sacrum and lumbar spine consistent with treated metastasis.  Similar sclerotic lesions in the left humeral head and upper thoracic/cervical spine.  IMPRESSION:  1.  No evidence of metastasis in the soft tissues of the abdomen or  pelvis. 2.  Stable sclerotic metastasis within the pelvis, spine, and left shoulder.   Original Report Authenticated By: Genevive Bi, M.D.    ASSESSMENT/PLAN: this is a very pleasant 62 years old white female with metastatic non-small cell lung cancer with positive EGFR mutation and has been on treatment with Tarceva since December of 2010 but unfortunately the patient has some evidence for disease progression in the right lower lobe lung mass as well as the left cervical lymphadenopathy. The patient is now being treated with Gliotrif 40 mg by mouth daily however she is only been on this medication for the past 3 days. The patient was discussed with [name]. She'll continue on the Gliotrif 40 mg by mouth daily and she will return in 2 weeks for another symptom management visit with a CBC differential and C. met. It is possible that her hoarseness may be related to irritation of her vocal cords from the left cervical lymphadenopathy via mass effect. When she returns in 2 weeks for another symptom management visit we will see if 2 more weeks on the Gliotrif have had any impact on her level of hoarseness. If it persists we will refer her to an ear nose and throat specialist for further evaluation and management. She will continue on her Xgeva injections on a monthly basis and is due for her injection  today.  Laural Benes, Makayla Guimond E, PA-C   All questions were answered. The patient knows to call the clinic with any problems, questions or concerns. We can certainly see the patient much sooner if necessary.  I spent 20 minutes counseling the patient face to face. The total time spent in the appointment was 30 minutes.

## 2012-12-20 NOTE — Telephone Encounter (Signed)
Gave pt appt for lab and ML on 01/03/13

## 2012-12-27 ENCOUNTER — Other Ambulatory Visit: Payer: Self-pay | Admitting: Internal Medicine

## 2012-12-27 ENCOUNTER — Other Ambulatory Visit: Payer: Self-pay | Admitting: *Deleted

## 2012-12-27 ENCOUNTER — Encounter: Payer: Self-pay | Admitting: Internal Medicine

## 2012-12-27 NOTE — Progress Notes (Signed)
Enrolled pt in the Xgeva First Step program.  I will fax signed form and activate card today.

## 2013-01-03 ENCOUNTER — Telehealth: Payer: Self-pay | Admitting: Internal Medicine

## 2013-01-03 ENCOUNTER — Ambulatory Visit (HOSPITAL_BASED_OUTPATIENT_CLINIC_OR_DEPARTMENT_OTHER): Payer: Managed Care, Other (non HMO) | Admitting: Physician Assistant

## 2013-01-03 ENCOUNTER — Other Ambulatory Visit (HOSPITAL_BASED_OUTPATIENT_CLINIC_OR_DEPARTMENT_OTHER): Payer: Managed Care, Other (non HMO) | Admitting: Lab

## 2013-01-03 ENCOUNTER — Ambulatory Visit: Payer: Managed Care, Other (non HMO)

## 2013-01-03 DIAGNOSIS — C779 Secondary and unspecified malignant neoplasm of lymph node, unspecified: Secondary | ICD-10-CM

## 2013-01-03 DIAGNOSIS — C7951 Secondary malignant neoplasm of bone: Secondary | ICD-10-CM

## 2013-01-03 DIAGNOSIS — B029 Zoster without complications: Secondary | ICD-10-CM

## 2013-01-03 DIAGNOSIS — R490 Dysphonia: Secondary | ICD-10-CM

## 2013-01-03 DIAGNOSIS — C3490 Malignant neoplasm of unspecified part of unspecified bronchus or lung: Secondary | ICD-10-CM

## 2013-01-03 LAB — CBC WITH DIFFERENTIAL/PLATELET
Basophils Absolute: 0 10*3/uL (ref 0.0–0.1)
EOS%: 0.5 % (ref 0.0–7.0)
Eosinophils Absolute: 0 10*3/uL (ref 0.0–0.5)
HGB: 11 g/dL — ABNORMAL LOW (ref 11.6–15.9)
LYMPH%: 18.3 % (ref 14.0–49.7)
MCH: 29.9 pg (ref 25.1–34.0)
MCV: 89 fL (ref 79.5–101.0)
MONO%: 11.1 % (ref 0.0–14.0)
NEUT#: 3.8 10*3/uL (ref 1.5–6.5)
NEUT%: 69.9 % (ref 38.4–76.8)
Platelets: 230 10*3/uL (ref 145–400)

## 2013-01-03 LAB — COMPREHENSIVE METABOLIC PANEL (CC13)
Albumin: 2.8 g/dL — ABNORMAL LOW (ref 3.5–5.0)
Alkaline Phosphatase: 134 U/L (ref 40–150)
BUN: 10.6 mg/dL (ref 7.0–26.0)
Creatinine: 0.7 mg/dL (ref 0.6–1.1)
Glucose: 97 mg/dl (ref 70–99)
Potassium: 4.3 mEq/L (ref 3.5–5.1)
Total Bilirubin: 0.22 mg/dL (ref 0.20–1.20)

## 2013-01-03 MED ORDER — FAMCICLOVIR 500 MG PO TABS: 500.0000 mg | ORAL_TABLET | Freq: Three times a day (TID) | ORAL | Status: DC

## 2013-01-03 NOTE — Patient Instructions (Addendum)
Continue Gilotrif 40 mg by mouth daily Followup in 2 weeks for another symptom management visit You're being referred to an ENT specialist regarding your voice hoarseness

## 2013-01-10 ENCOUNTER — Telehealth: Payer: Self-pay | Admitting: *Deleted

## 2013-01-10 NOTE — Telephone Encounter (Signed)
ENT progress note dated 01/07/13 given to Dr Donnald Garre to review.  SLJ

## 2013-01-11 NOTE — Progress Notes (Signed)
[facility] [phone]   [phone]  OFFICE PROGRESS NOTE  Makayla Pounds, MD [address] [facility], [place]. 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  Marland Kitchen   PRIOR THERAPY:  1. Status post palliative radiotherapy to the right hilum as well as anterior inferior ribs and lumbar spine from L2-L5 under the care Dr. Shon Baton.  2. status post right hip prophylactic trochanteric femoral nail and under the care of Dr. Eulas Post  3. palliative radiotherapy to his lumbar spine under the care of Dr.Wentworth  4. Tarceva at 150 mg by mouth daily status post 37 months of treatment   CURRENT THERAPY:  1. Gilotrif 40 mg by mouth daily. Therapy started 12/17/2012, status post approximately 2 weeks of therapy 2. Xgeva 120 mg subcutaneously given on a monthly basis for bone metastasis status post 16 treatments    INTERVAL HISTORY: Makayla [name] 62 y.o. female returns to the clinic today for followup visit accompanied by her sister. Overall she's tolerating the Gilotrif without significant difficulty. She has had a few episodes of diarrhea as well as the mild skin rash associated with this medication and her skin is generally dry. She continues to have voice hoarseness and states that it tends to get worse as she is fatigued. She is interested in being evaluated by an ear nose and throat specialist. She also reports a rash/lesions on her buttocks that has been coming and going but does not seem to be healing. She has tried the clindamycin solution as well as Bactroban to this area without relief. She continues to have mild fatigue and shortness breath with exertion. Her pain is currently well-controlled with MS Contin 30 mg by mouth every 8 hours and Vicodin 2-3 tablets  throughout the day as needed for breakthrough pain.  MEDICAL HISTORY: Past Medical History  Diagnosis Date  . COPD (chronic obstructive pulmonary disease)   . Lung cancer     lung ca dx 12/10  . Lung cancer 09/01/2011    bone mets  . Hx of radiation therapy   . Hx antineoplastic chemotherapy   . Shortness of breath   . GERD (gastroesophageal reflux disease)     ALLERGIES:  is allergic to sulfonamide derivatives.  MEDICATIONS:  Current Outpatient Prescriptions  Medication Sig Dispense Refill  . afatinib dimaleate (GILOTRIF) 40 MG tablet Take 1 tablet (40 mg total) by mouth daily. Take on an empty stomach 1hr before or 2 hrs after meals.  30 tablet  1  . ALPRAZolam (XANAX) 0.5 MG tablet Take 0.5 mg by mouth at bedtime as needed. For sleep      . calcium-vitamin D (OSCAL WITH D) 500-200 MG-UNIT per tablet Take 1 tablet by mouth daily.        . clindamycin (CLEOCIN T) 1 % external solution Apply 1 application topically 2 (two) times daily as needed. Apply as needed to scalp      . denosumab (XGEVA) 120 MG/1.7ML SOLN Inject 120 mg into the skin every 30 (thirty) days.       . diphenhydramine-acetaminophen (TYLENOL PM) 25-500 MG TABS Take 1 tablet by mouth at bedtime as needed.      . docusate sodium (COLACE) 100 MG capsule Take 200 mg by mouth at bedtime.       Marland Kitchen  famciclovir (FAMVIR) 500 MG tablet Take 1 tablet (500 mg total) by mouth 3 (three) times daily.  21 tablet  0  . ferrous sulfate 325 (65 FE) MG tablet Take 325 mg by mouth 2 (two) times daily.        . Hydrocodone-Acetaminophen 5-300 MG TABS TAKE 1 TABLET BY MOUTH EVERY 6 HOURS AS NEEDED FOR PAIN  60 each  0  . Magnesium 250 MG TABS Take 1 tablet by mouth daily.      Marland Kitchen morphine (MS CONTIN) 30 MG 12 hr tablet Take 1 tablet by mouth every 8 hours  90 tablet  0  . [DISCONTINUED] warfarin (COUMADIN) 5 MG tablet Take 5 mg by mouth daily.         No current facility-administered medications for this visit.    SURGICAL HISTORY:   Past Surgical History  Procedure Laterality Date  . Abdominal hysterectomy    . Surgery right leg, rod and pin      REVIEW OF SYSTEMS:  A comprehensive review of systems was negative except for: Constitutional: positive for fatigue Ears, nose, mouth, throat, and face: positive for hoarseness Respiratory: positive for dyspnea on exertion   PHYSICAL EXAMINATION: General appearance: alert, cooperative and no distress Head: Normocephalic, without obvious abnormality, atraumatic Neck: Palpable a small left cervical lymph node around 2 CM in size. Lymph nodes: Cervical, supraclavicular, and axillary nodes normal. and Palpable left cervical lymph node Resp: Absent breath sounds and dullness to percussion in the right lung field Cardio: regular rate and rhythm, S1, S2 normal, no murmur, click, rub or gallop GI: soft, non-tender; bowel sounds normal; no masses,  no organomegaly Extremities: extremities normal, atraumatic, no cyanosis or edema Neurologic: Alert and oriented X 3, normal strength and tone. Normal symmetric reflexes. Normal coordination and gait Skin: There lesions on her right buttock with some mirrored lesions on the left buttock right where the "cheeks" meet. These appear to be at the same dermatome level and are suspicious for a herpetic/zoster type eruption. They look vesicular in nature  ECOG PERFORMANCE STATUS: 1 - Symptomatic but completely ambulatory  Blood pressure 129/87, pulse 108, temperature 97.9 F (36.6 C), temperature source Oral, resp. rate 20, height 5\' 7"  (1.702 m), weight 150 lb 14.4 oz (68.448 kg).  LABORATORY DATA: Lab Results  Component Value Date   WBC 5.4 01/03/2013   HGB 11.0* 01/03/2013   HCT 32.8* 01/03/2013   MCV 89.0 01/03/2013   PLT 230 01/03/2013      Chemistry      Component Value Date/Time   NA 134* 01/03/2013 1453   NA 136 05/09/2012 1321   NA 137 09/21/2011 0902   K 4.3 01/03/2013 1453   K 4.3 05/09/2012 1321   K 4.2 09/21/2011 0902   CL 102  01/03/2013 1453   CL 101 05/09/2012 1321   CL 101 09/21/2011 0902   CO2 24 01/03/2013 1453   CO2 24 05/09/2012 1321   CO2 28 09/21/2011 0902   BUN 10.6 01/03/2013 1453   BUN 12 05/09/2012 1321   BUN 16 09/21/2011 0902   CREATININE 0.7 01/03/2013 1453   CREATININE 0.72 05/09/2012 1321   CREATININE 1.0 09/21/2011 0902      Component Value Date/Time   CALCIUM 8.6 01/03/2013 1453   CALCIUM 9.0 05/09/2012 1321   CALCIUM 8.7 09/21/2011 0902   ALKPHOS 134 01/03/2013 1453   ALKPHOS 144* 05/09/2012 1321   ALKPHOS 130* 09/21/2011 0902   AST 20 01/03/2013 1453   AST  22 05/09/2012 1321   AST 27 09/21/2011 0902   ALT 11 01/03/2013 1453   ALT 16 05/09/2012 1321   BILITOT 0.22 01/03/2013 1453   BILITOT 0.5 05/09/2012 1321   BILITOT 0.60 09/21/2011 0902       RADIOGRAPHIC STUDIES: Ct Soft Tissue Neck W Contrast  11/02/2012  *RADIOLOGY REPORT*  Clinical Data: Painful left neck mass.  Lung cancer.  CT NECK WITH CONTRAST  Technique:  Multidetector CT imaging of the neck was performed with intravenous contrast.  Contrast: OMNIPAQUE IOHEXOL 300 MG/ML  SOLN  Comparison: CT chest abdomen pelvis 09/21/2011.  Findings: Limited imaging of the brain is unremarkable.  A left level III peripherally enhancing lymph node measures 14 x 15 x 16 mm.  The left jugulodigastric lymph node appears be within normal limits, measuring 20 mm in maximal length.  No other enlarged cervical lymph nodes are present.  No significant right- sided adenopathy is present.  A soft tissue nodule below the thyroid is increased in size since the prior study.  This lesion now measures 13 x 13 x 13 mm.  The mediastinum shifted to the right, compatible with the patient's pneumonectomy.  The C5 vertebral body demonstrates diffuse sclerosis compatible with metastasis.  There is sclerosis in the posterior elements at T2.  Sclerotic changes are noted in the posterior elements at C6 and C4.  Degenerative changes are present at C3-4 with right greater than left osseous  foraminal narrowing.  IMPRESSION:  1. 16 mm left level III peripherally enhancing and centrally necrotic lymph node is most compatible with metastatic disease. 2.  Osseous metastases at C4, C5, C6, and T2. 3.  A prominent left sided jugulodigastric lymph node is not clearly represent metastatic disease. 4.  Enlarging soft tissue nodule just below the thyroid likely represents a second metastatic node, measuring 13 mm.   Original Report Authenticated By: Marin Roberts, M.D.    Ct Chest W Contrast  11/02/2012  *RADIOLOGY REPORT*  Clinical Data:  Restaging lung carcinoma.  Metastatic non-small cell lung cancer status post radiation to the right hilum as well as the lumbar spine.  CT CHEST, ABDOMEN AND PELVIS WITH CONTRAST  Technique:  Multidetector CT imaging of the chest, abdomen and pelvis was performed following the standard protocol during bolus administration of intravenous contrast.  Contrast: OMNIPAQUE IOHEXOL 300 MG/ML  SOLN  Comparison:  CT 08/24/2012   CT CHEST  Findings:  Interval increase in size of the right lower lobe mass imbedded within the atelectatic right lower lobe.  Mass has ill- defined margins  but measures approximately 3.2 x 3.5 cm increased from 2.4 x 2.0 cm on prior.  The right lower lobe bronchus is obliterated.  No mediastinal lymphadenopathy.  There is a right paratracheal lymph node measuring 7 mm which is unchanged from prior.  No evidence of pulmonary nodule within the expanded left lung. Fluid the esophagus is similar to prior.  No axillary or supraclavicular adenopathy.  There is a lymph node in the upper mediastinum just inferior to the thyroid gland measuring 14 mm (image #11).  This is not significant changed from 14 mm on prior although the lesion does appear slightly more rounded.  IMPRESSION:  1.  Interval increase size of mass within the atelectatic right lower lobe. 2.   Stable enlarged high paratracheal lymph node.   CT ABDOMEN AND PELVIS  Findings:  There is  no focal hepatic lesion.  Gallbladder, pancreas, spleen, adrenal glands, and kidneys are normal.  The stomach, small bowel, and colon are normal.  Abdominal aorta is normal caliber.  No retroperitoneal periportal lymphadenopathy.  No free fluid the pelvis.  The uterus is been removed.  The bladder is normal.  No pelvic lymphadenopathy.  Review of the bone windows demonstrate extensive sclerotic change within the pelvis, sacrum and lumbar spine consistent with treated metastasis.  Similar sclerotic lesions in the left humeral head and upper thoracic/cervical spine.  IMPRESSION:  1.  No evidence of metastasis in the soft tissues of the abdomen or  pelvis. 2.  Stable sclerotic metastasis within the pelvis, spine, and left shoulder.   Original Report Authenticated By: Genevive Bi, M.D.    ASSESSMENT/PLAN: this is a very pleasant 62 years old white female with metastatic non-small cell lung cancer with positive EGFR mutation and has been on treatment with Tarceva since December of 2010 but unfortunately the patient has some evidence for disease progression in the right lower lobe lung mass as well as the left cervical lymphadenopathy. The patient is now being treated with Gliotrif 40 mg by mouth daily and is now status post approximately 2 weeks of therapy.  The patient was discussed with Dr. Truett Perna in Dr. Asa Lente absence. She'll continue on the Gliotrif 40 mg by mouth daily and she will return in 2 weeks for another symptom management visit with a CBC differential and C. met. It is possible that her hoarseness may be related to irritation of her vocal cords from the left cervical lymphadenopathy via mass effect. We will refer her to ENT specialist for evaluation of any other cause of her her hoarseness.she will be due for her next Xgeva injection on 01/17/2013. She'll be placed on a course of Famvir for her suspected herpes zoster affecting her sacral buttock area.  Makayla [name], Makayla Chauncey E, PA-C   All questions  were answered. The patient knows to call the clinic with any problems, questions or concerns. We can certainly see the patient much sooner if necessary.  I spent 20 minutes counseling the patient face to face. The total time spent in the appointment was 30 minutes.

## 2013-01-14 ENCOUNTER — Other Ambulatory Visit: Payer: Self-pay | Admitting: Medical Oncology

## 2013-01-14 MED ORDER — MORPHINE SULFATE ER 30 MG PO TBCR: EXTENDED_RELEASE_TABLET | ORAL | Status: DC

## 2013-01-14 NOTE — Telephone Encounter (Signed)
MS contin refill authorized and locked in injection room.

## 2013-01-17 ENCOUNTER — Other Ambulatory Visit (HOSPITAL_BASED_OUTPATIENT_CLINIC_OR_DEPARTMENT_OTHER): Payer: Managed Care, Other (non HMO) | Admitting: Lab

## 2013-01-17 ENCOUNTER — Ambulatory Visit (HOSPITAL_BASED_OUTPATIENT_CLINIC_OR_DEPARTMENT_OTHER): Payer: Managed Care, Other (non HMO) | Admitting: Physician Assistant

## 2013-01-17 ENCOUNTER — Ambulatory Visit (HOSPITAL_BASED_OUTPATIENT_CLINIC_OR_DEPARTMENT_OTHER): Payer: Managed Care, Other (non HMO)

## 2013-01-17 ENCOUNTER — Telehealth: Payer: Self-pay | Admitting: Internal Medicine

## 2013-01-17 DIAGNOSIS — C7951 Secondary malignant neoplasm of bone: Secondary | ICD-10-CM

## 2013-01-17 DIAGNOSIS — C3430 Malignant neoplasm of lower lobe, unspecified bronchus or lung: Secondary | ICD-10-CM

## 2013-01-17 DIAGNOSIS — C3490 Malignant neoplasm of unspecified part of unspecified bronchus or lung: Secondary | ICD-10-CM

## 2013-01-17 DIAGNOSIS — R599 Enlarged lymph nodes, unspecified: Secondary | ICD-10-CM

## 2013-01-17 DIAGNOSIS — C7952 Secondary malignant neoplasm of bone marrow: Secondary | ICD-10-CM

## 2013-01-17 LAB — CBC WITH DIFFERENTIAL/PLATELET
Basophils Absolute: 0 10*3/uL (ref 0.0–0.1)
Eosinophils Absolute: 0.1 10*3/uL (ref 0.0–0.5)
HCT: 30.5 % — ABNORMAL LOW (ref 34.8–46.6)
HGB: 10.4 g/dL — ABNORMAL LOW (ref 11.6–15.9)
MCV: 87.3 fL (ref 79.5–101.0)
MONO%: 10.3 % (ref 0.0–14.0)
NEUT#: 3 10*3/uL (ref 1.5–6.5)
NEUT%: 66.1 % (ref 38.4–76.8)
RDW: 12.8 % (ref 11.2–14.5)

## 2013-01-17 LAB — COMPREHENSIVE METABOLIC PANEL (CC13)
Albumin: 2.7 g/dL — ABNORMAL LOW (ref 3.5–5.0)
Alkaline Phosphatase: 144 U/L (ref 40–150)
BUN: 13.6 mg/dL (ref 7.0–26.0)
Calcium: 8.7 mg/dL (ref 8.4–10.4)
Chloride: 103 mEq/L (ref 98–107)
Glucose: 74 mg/dl (ref 70–99)
Potassium: 3.8 mEq/L (ref 3.5–5.1)

## 2013-01-17 MED ORDER — DENOSUMAB 120 MG/1.7ML ~~LOC~~ SOLN
120.0000 mg | Freq: Once | SUBCUTANEOUS | Status: AC
  Administered 2013-01-17: 120 mg via SUBCUTANEOUS
  Filled 2013-01-17: qty 1.7

## 2013-01-17 MED ORDER — HYDROCODONE-ACETAMINOPHEN 5-300 MG PO TABS: ORAL_TABLET | ORAL | Status: DC

## 2013-01-17 NOTE — Telephone Encounter (Signed)
gve the pt her may 2014 appt calendar 

## 2013-01-17 NOTE — Patient Instructions (Signed)
Continue Gilotrif 40 mg by mouth daily Continue monthly Xgeva injections as scheduled Followup with [name] in one month with a restaging CT scan of the chest, abdomen and pelvis to reevaluate your disease

## 2013-01-18 ENCOUNTER — Telehealth: Payer: Self-pay | Admitting: Internal Medicine

## 2013-01-18 NOTE — Telephone Encounter (Signed)
mvoed 5/14 appts to 5/13 due to MM PAL. S/w pt she is aware of new d/t.

## 2013-01-22 NOTE — Progress Notes (Signed)
[facility] [phone]   Fax:(336) [phone]  OFFICE PROGRESS NOTE  Makayla Pounds, MD [address] [facility], [place]. 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  Marland Kitchen   PRIOR THERAPY:  1. Status post palliative radiotherapy to the right hilum as well as anterior inferior ribs and lumbar spine from L2-L5 under the care Dr. Shon Baton.  2. status post right hip prophylactic trochanteric femoral nail and under the care of Dr. Eulas Post  3. palliative radiotherapy to his lumbar spine under the care of Dr.Wentworth  4. Tarceva at 150 mg by mouth daily status post 37 months of treatment   CURRENT THERAPY:  1. Gilotrif 40 mg by mouth daily. Therapy started 12/17/2012, status post approximately 2 weeks of therapy 2. Xgeva 120 mg subcutaneously given on a monthly basis for bone metastasis status post 16 treatments    INTERVAL HISTORY: Makayla [name] 62 y.o. female returns to the clinic today for followup visit accompanied by her sister. Overall she's tolerating the Gilotrif without significant difficulty. She has had a few episodes of diarrhea as well as the mild skin rash associated with this medication and her skin is generally dry. Regarding the diarrhea she states that she watches her diet better particularly as it pertains to the amount of roughage she ingests she is able to control the diarrhea better and continues to use loperamide 2 mg 2 tablets twice daily as needed for diarrhea with good results. Her pain management remains under good control with her current schedule of MS Contin 30 mg 3 times daily with Vicodin now only about twice a day. She does request a refill for the Vicodin. She reports that she did see Dr. 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 She continues to have voice hoarseness but notes that her voice is slightly stronger. She also notes less left neck swelling.  She also reports a rash/lesions on her buttocks that has been coming and going but does not seem to be healing. We prescribed a course of Famvir, as the lesions were suspicious for hepatic origin. Patient states that the Famvir did not seem to make any significant change in the lesions. She is currently using azinc based cream on the lesions and they are getting better. She voiced no other specific complaints today.   MEDICAL HISTORY: Past Medical History  Diagnosis Date  . COPD (chronic obstructive pulmonary disease)   . Lung cancer     lung ca dx 12/10  . Lung cancer 09/01/2011    bone mets  . Hx of radiation therapy   . Hx antineoplastic chemotherapy   . Shortness of breath   . GERD (gastroesophageal reflux disease)     ALLERGIES:  is allergic to sulfonamide derivatives.  MEDICATIONS:  Current Outpatient Prescriptions  Medication Sig Dispense Refill  . afatinib dimaleate (GILOTRIF) 40 MG tablet Take 1 tablet (40 mg total) by mouth daily. Take on an empty stomach 1hr before or 2 hrs after meals.  30 tablet  1  . ALPRAZolam (XANAX) 0.5 MG tablet Take 0.5 mg by mouth at bedtime as needed. For sleep      . calcium-vitamin D (OSCAL WITH D) 500-200 MG-UNIT per tablet Take 1 tablet by mouth daily.        Marland Kitchen  clindamycin (CLEOCIN T) 1 % external solution Apply 1 application topically 2 (two) times daily as needed. Apply as needed to scalp      . denosumab (XGEVA) 120 MG/1.7ML SOLN Inject 120 mg into the skin every 30 (thirty) days.       . diphenhydramine-acetaminophen (TYLENOL PM) 25-500 MG TABS Take 1 tablet by mouth at bedtime as needed.      . ferrous sulfate 325 (65 FE) MG tablet Take 325 mg by mouth 2 (two) times daily.        . Hydrocodone-Acetaminophen 5-300 MG TABS TAKE 1 TABLET BY MOUTH EVERY 6 HOURS AS NEEDED FOR PAIN  60 each  0   . Magnesium 250 MG TABS Take 1 tablet by mouth daily.      Marland Kitchen morphine (MS CONTIN) 30 MG 12 hr tablet Take 1 tablet by mouth every 8 hours  90 tablet  0  . [DISCONTINUED] warfarin (COUMADIN) 5 MG tablet Take 5 mg by mouth daily.         No current facility-administered medications for this visit.    SURGICAL HISTORY:  Past Surgical History  Procedure Laterality Date  . Abdominal hysterectomy    . Surgery right leg, rod and pin      REVIEW OF SYSTEMS:  A comprehensive review of systems was negative except for: Constitutional: positive for fatigue Ears, nose, mouth, throat, and face: positive for hoarseness Respiratory: positive for dyspnea on exertion   PHYSICAL EXAMINATION: General appearance: alert, cooperative and no distress Head: Normocephalic, without obvious abnormality, atraumatic Neck: Palpable a small left cervical lymph node around 2 CM in size. Lymph nodes: Cervical, supraclavicular, and axillary nodes normal. and Palpable left cervical lymph node Resp: Absent breath sounds and dullness to percussion in the right lung field Cardio: regular rate and rhythm, S1, S2 normal, no murmur, click, rub or gallop GI: soft, non-tender; bowel sounds normal; no masses,  no organomegaly Extremities: extremities normal, atraumatic, no cyanosis or edema Neurologic: Alert and oriented X 3, normal strength and tone. Normal symmetric reflexes. Normal coordination and gait Skin: There lesions on her right buttock with some mirrored lesions on the left buttock right where the "cheeks" meet. There is some improvement in there appearance of these lesions.  ECOG PERFORMANCE STATUS: 1 - Symptomatic but completely ambulatory  Blood pressure 124/78, pulse 101, temperature 96.6 F (35.9 C), temperature source Oral, resp. rate 20, height 5\' 7"  (1.702 m), weight 151 lb 9.6 oz (68.765 kg).  LABORATORY DATA: Lab Results  Component Value Date   WBC 4.6 01/17/2013   HGB 10.4* 01/17/2013   HCT 30.5*  01/17/2013   MCV 87.3 01/17/2013   PLT 257 01/17/2013      Chemistry      Component Value Date/Time   NA 137 01/17/2013 1441   NA 136 05/09/2012 1321   NA 137 09/21/2011 0902   K 3.8 01/17/2013 1441   K 4.3 05/09/2012 1321   K 4.2 09/21/2011 0902   CL 103 01/17/2013 1441   CL 101 05/09/2012 1321   CL 101 09/21/2011 0902   CO2 25 01/17/2013 1441   CO2 24 05/09/2012 1321   CO2 28 09/21/2011 0902   BUN 13.6 01/17/2013 1441   BUN 12 05/09/2012 1321   BUN 16 09/21/2011 0902   CREATININE 0.8 01/17/2013 1441   CREATININE 0.72 05/09/2012 1321   CREATININE 1.0 09/21/2011 0902      Component Value Date/Time   CALCIUM 8.7 01/17/2013 1441   CALCIUM 9.0  05/09/2012 1321   CALCIUM 8.7 09/21/2011 0902   ALKPHOS 144 01/17/2013 1441   ALKPHOS 144* 05/09/2012 1321   ALKPHOS 130* 09/21/2011 0902   AST 19 01/17/2013 1441   AST 22 05/09/2012 1321   AST 27 09/21/2011 0902   ALT 11 01/17/2013 1441   ALT 16 05/09/2012 1321   BILITOT 0.21 01/17/2013 1441   BILITOT 0.5 05/09/2012 1321   BILITOT 0.60 09/21/2011 0902       RADIOGRAPHIC STUDIES: Ct Soft Tissue Neck W Contrast  11/02/2012  *RADIOLOGY REPORT*  Clinical Data: Painful left neck mass.  Lung cancer.  CT NECK WITH CONTRAST  Technique:  Multidetector CT imaging of the neck was performed with intravenous contrast.  Contrast: OMNIPAQUE IOHEXOL 300 MG/ML  SOLN  Comparison: CT chest abdomen pelvis 09/21/2011.  Findings: Limited imaging of the brain is unremarkable.  A left level III peripherally enhancing lymph node measures 14 x 15 x 16 mm.  The left jugulodigastric lymph node appears be within normal limits, measuring 20 mm in maximal length.  No other enlarged cervical lymph nodes are present.  No significant right- sided adenopathy is present.  A soft tissue nodule below the thyroid is increased in size since the prior study.  This lesion now measures 13 x 13 x 13 mm.  The mediastinum shifted to the right, compatible with the patient's pneumonectomy.  The C5 vertebral  body demonstrates diffuse sclerosis compatible with metastasis.  There is sclerosis in the posterior elements at T2.  Sclerotic changes are noted in the posterior elements at C6 and C4.  Degenerative changes are present at C3-4 with right greater than left osseous foraminal narrowing.  IMPRESSION:  1. 16 mm left level III peripherally enhancing and centrally necrotic lymph node is most compatible with metastatic disease. 2.  Osseous metastases at C4, C5, C6, and T2. 3.  A prominent left sided jugulodigastric lymph node is not clearly represent metastatic disease. 4.  Enlarging soft tissue nodule just below the thyroid likely represents a second metastatic node, measuring 13 mm.   Original Report Authenticated By: Marin Roberts, M.D.    Ct Chest W Contrast  11/02/2012  *RADIOLOGY REPORT*  Clinical Data:  Restaging lung carcinoma.  Metastatic non-small cell lung cancer status post radiation to the right hilum as well as the lumbar spine.  CT CHEST, ABDOMEN AND PELVIS WITH CONTRAST  Technique:  Multidetector CT imaging of the chest, abdomen and pelvis was performed following the standard protocol during bolus administration of intravenous contrast.  Contrast: OMNIPAQUE IOHEXOL 300 MG/ML  SOLN  Comparison:  CT 08/24/2012   CT CHEST  Findings:  Interval increase in size of the right lower lobe mass imbedded within the atelectatic right lower lobe.  Mass has ill- defined margins  but measures approximately 3.2 x 3.5 cm increased from 2.4 x 2.0 cm on prior.  The right lower lobe bronchus is obliterated.  No mediastinal lymphadenopathy.  There is a right paratracheal lymph node measuring 7 mm which is unchanged from prior.  No evidence of pulmonary nodule within the expanded left lung. Fluid the esophagus is similar to prior.  No axillary or supraclavicular adenopathy.  There is a lymph node in the upper mediastinum just inferior to the thyroid gland measuring 14 mm (image #11).  This is not significant  changed from 14 mm on prior although the lesion does appear slightly more rounded.  IMPRESSION:  1.  Interval increase size of mass within the atelectatic right lower lobe.  2.   Stable enlarged high paratracheal lymph node.   CT ABDOMEN AND PELVIS  Findings:  There is no focal hepatic lesion.  Gallbladder, pancreas, spleen, adrenal glands, and kidneys are normal.  The stomach, small bowel, and colon are normal.  Abdominal aorta is normal caliber.  No retroperitoneal periportal lymphadenopathy.  No free fluid the pelvis.  The uterus is been removed.  The bladder is normal.  No pelvic lymphadenopathy.  Review of the bone windows demonstrate extensive sclerotic change within the pelvis, sacrum and lumbar spine consistent with treated metastasis.  Similar sclerotic lesions in the left humeral head and upper thoracic/cervical spine.  IMPRESSION:  1.  No evidence of metastasis in the soft tissues of the abdomen or  pelvis. 2.  Stable sclerotic metastasis within the pelvis, spine, and left shoulder.   Original Report Authenticated By: Genevive Bi, M.D.    ASSESSMENT/PLAN: this is a very pleasant 62 years old white female with metastatic non-small cell lung cancer with positive EGFR mutation and has been on treatment with Tarceva since December of 2010 but unfortunately the patient has some evidence for disease progression in the right lower lobe lung mass as well as the left cervical lymphadenopathy. The patient is now being treated with Gliotrif 40 mg by mouth daily and is now status post approximately 4 weeks of therapy.  The patient was discussed with [name]. She'll continue on the Gliotrif 40 mg by mouth daily. She will continue with her monthly Xgeva the injections as scheduled. She'll followup with [name] in one month with a restaging CT scan of the chest, abdomen and pelvis with contrast to reevaluate her disease  she will receive her Xgeva  injection today as scheduled.  Laural Benes, Reise Hietala E,  PA-C   All questions were answered. The patient knows to call the clinic with any problems, questions or concerns. We can certainly see the patient much sooner if necessary.  I spent 20 minutes counseling the patient face to face. The total time spent in the appointment was 30 minutes.

## 2013-02-07 ENCOUNTER — Other Ambulatory Visit: Payer: Self-pay | Admitting: *Deleted

## 2013-02-07 NOTE — Telephone Encounter (Signed)
THIS REFILL REQUEST FOR GILOTRIF WAS GIVEN TO DR.MOHAMED'S NURSE, DIANE BELL,RN. 

## 2013-02-08 ENCOUNTER — Encounter (HOSPITAL_COMMUNITY): Payer: Self-pay

## 2013-02-08 ENCOUNTER — Ambulatory Visit (HOSPITAL_COMMUNITY)
Admission: RE | Admit: 2013-02-08 | Discharge: 2013-02-08 | Disposition: A | Discharge status: Home / Self Care | Payer: Managed Care, Other (non HMO) | Source: Ambulatory Visit | Attending: Physician Assistant | Admitting: Physician Assistant

## 2013-02-08 ENCOUNTER — Other Ambulatory Visit: Payer: Self-pay | Admitting: Medical Oncology

## 2013-02-08 DIAGNOSIS — C3490 Malignant neoplasm of unspecified part of unspecified bronchus or lung: Secondary | ICD-10-CM | POA: Insufficient documentation

## 2013-02-08 DIAGNOSIS — Z90710 Acquired absence of both cervix and uterus: Secondary | ICD-10-CM | POA: Insufficient documentation

## 2013-02-08 DIAGNOSIS — Z79899 Other long term (current) drug therapy: Secondary | ICD-10-CM | POA: Insufficient documentation

## 2013-02-08 DIAGNOSIS — J984 Other disorders of lung: Secondary | ICD-10-CM | POA: Insufficient documentation

## 2013-02-08 DIAGNOSIS — I700 Atherosclerosis of aorta: Secondary | ICD-10-CM | POA: Insufficient documentation

## 2013-02-08 DIAGNOSIS — Z923 Personal history of irradiation: Secondary | ICD-10-CM | POA: Insufficient documentation

## 2013-02-08 DIAGNOSIS — K449 Diaphragmatic hernia without obstruction or gangrene: Secondary | ICD-10-CM | POA: Insufficient documentation

## 2013-02-08 DIAGNOSIS — C7951 Secondary malignant neoplasm of bone: Secondary | ICD-10-CM | POA: Insufficient documentation

## 2013-02-08 MED ORDER — AFATINIB DIMALEATE 40 MG PO TABS: 40.0000 mg | ORAL_TABLET | Freq: Every day | ORAL | Status: DC

## 2013-02-08 MED ORDER — IOHEXOL 300 MG/ML  SOLN
100.0000 mL | Freq: Once | INTRAMUSCULAR | Status: AC | PRN
  Administered 2013-02-08: 100 mL via INTRAVENOUS

## 2013-02-12 ENCOUNTER — Telehealth: Payer: Self-pay | Admitting: Internal Medicine

## 2013-02-12 ENCOUNTER — Ambulatory Visit (HOSPITAL_BASED_OUTPATIENT_CLINIC_OR_DEPARTMENT_OTHER): Payer: Managed Care, Other (non HMO)

## 2013-02-12 ENCOUNTER — Other Ambulatory Visit: Payer: Self-pay | Admitting: *Deleted

## 2013-02-12 ENCOUNTER — Encounter: Payer: Self-pay | Admitting: Internal Medicine

## 2013-02-12 ENCOUNTER — Other Ambulatory Visit (HOSPITAL_BASED_OUTPATIENT_CLINIC_OR_DEPARTMENT_OTHER): Payer: Managed Care, Other (non HMO) | Admitting: Lab

## 2013-02-12 ENCOUNTER — Ambulatory Visit (HOSPITAL_BASED_OUTPATIENT_CLINIC_OR_DEPARTMENT_OTHER): Payer: Managed Care, Other (non HMO) | Admitting: Internal Medicine

## 2013-02-12 DIAGNOSIS — C3490 Malignant neoplasm of unspecified part of unspecified bronchus or lung: Secondary | ICD-10-CM

## 2013-02-12 DIAGNOSIS — R21 Rash and other nonspecific skin eruption: Secondary | ICD-10-CM

## 2013-02-12 DIAGNOSIS — C3430 Malignant neoplasm of lower lobe, unspecified bronchus or lung: Secondary | ICD-10-CM

## 2013-02-12 DIAGNOSIS — C7951 Secondary malignant neoplasm of bone: Secondary | ICD-10-CM

## 2013-02-12 DIAGNOSIS — R599 Enlarged lymph nodes, unspecified: Secondary | ICD-10-CM

## 2013-02-12 LAB — CBC WITH DIFFERENTIAL/PLATELET
Basophils Absolute: 0 10*3/uL (ref 0.0–0.1)
Eosinophils Absolute: 0.1 10*3/uL (ref 0.0–0.5)
HCT: 31.8 % — ABNORMAL LOW (ref 34.8–46.6)
HGB: 10.6 g/dL — ABNORMAL LOW (ref 11.6–15.9)
MCH: 28.7 pg (ref 25.1–34.0)
MCV: 85.9 fL (ref 79.5–101.0)
MONO%: 9.3 % (ref 0.0–14.0)
NEUT#: 3 10*3/uL (ref 1.5–6.5)
NEUT%: 69.1 % (ref 38.4–76.8)
RDW: 13 % (ref 11.2–14.5)

## 2013-02-12 LAB — COMPREHENSIVE METABOLIC PANEL (CC13)
ALT: 10 U/L (ref 0–55)
AST: 16 U/L (ref 5–34)
Albumin: 2.7 g/dL — ABNORMAL LOW (ref 3.5–5.0)
Alkaline Phosphatase: 140 U/L (ref 40–150)
BUN: 11.1 mg/dL (ref 7.0–26.0)
Calcium: 8.6 mg/dL (ref 8.4–10.4)
Chloride: 105 mEq/L (ref 98–107)
Potassium: 3.9 mEq/L (ref 3.5–5.1)

## 2013-02-12 MED ORDER — DENOSUMAB 120 MG/1.7ML ~~LOC~~ SOLN
120.0000 mg | Freq: Once | SUBCUTANEOUS | Status: AC
  Administered 2013-02-12: 120 mg via SUBCUTANEOUS
  Filled 2013-02-12: qty 1.7

## 2013-02-12 MED ORDER — MORPHINE SULFATE ER 30 MG PO TBCR: EXTENDED_RELEASE_TABLET | ORAL | Status: DC

## 2013-02-12 NOTE — Progress Notes (Signed)
[facility] Telephone:(336) [phone]   Fax:(336) [phone]  OFFICE PROGRESS NOTE  [name], MD [address] [facility], [place]. 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   PRIOR THERAPY:  1. Status post palliative radiotherapy to the right hilum as well as anterior inferior ribs and lumbar spine from L2-L5 under the care Dr. Shon Baton.  2. status post right hip prophylactic trochanteric femoral nail and under the care of Dr. Eulas Post  3. palliative radiotherapy to his lumbar spine under the care of Dr.Wentworth  4. Tarceva at 150 mg by mouth daily status post 37 months of treatment   CURRENT THERAPY:  1. Gilotrif 40 mg by mouth daily. Therapy started 12/17/2012, status post approximately 6 weeks of therapy  2. Xgeva 120 mg subcutaneously given on a monthly basis for bone metastasis status post 16 treatments   INTERVAL HISTORY: Makayla [name] 62 y.o. female returns to the clinic today for followup visit accompanied by her sister and son. The patient is feeling fine today with no specific complaints except for grade 1 skin rash on the nose face as well as arms. She was clindamycin lotion with some improvement. The patient also had 2 episodes of diarrhea recently especially after drinking the contrast for the CT scan. But she denied having any regular diarrhea since she started Gilotrif. She is tolerating her treatment with Gilotrif fairly well except as mentioned above. The patient denied having any significant chest pain but continues to have shortness breath with exertion, no cough or hemoptysis. She had repeat CT scan of the chest, abdomen and pelvis performed recently and she is here today for evaluation and discussion of her scan results.  MEDICAL HISTORY: Past Medical  History  Diagnosis Date  . COPD (chronic obstructive pulmonary disease)   . Hx of radiation therapy   . Hx antineoplastic chemotherapy   . Shortness of breath   . GERD (gastroesophageal reflux disease)   . Lung cancer     lung ca dx 12/10  . Lung cancer 09/01/2011    bone mets    ALLERGIES:  is allergic to sulfonamide derivatives.  MEDICATIONS:  Current Outpatient Prescriptions  Medication Sig Dispense Refill  . afatinib dimaleate (GILOTRIF) 40 MG tablet Take 1 tablet (40 mg total) by mouth daily. Take on an empty stomach 1hr before or 2 hrs after meals.  30 tablet  0  . ALPRAZolam (XANAX) 0.5 MG tablet Take 0.5 mg by mouth at bedtime as needed. For sleep      . calcium-vitamin D (OSCAL WITH D) 500-200 MG-UNIT per tablet Take 1 tablet by mouth daily.        . clindamycin (CLEOCIN T) 1 % external solution Apply 1 application topically 2 (two) times daily as needed. Apply as needed to scalp      . denosumab (XGEVA) 120 MG/1.7ML SOLN Inject 120 mg into the skin every 30 (thirty) days.       . diphenhydramine-acetaminophen (TYLENOL PM) 25-500 MG TABS Take 1 tablet by mouth at bedtime as needed.      . ferrous sulfate 325 (65 FE) MG tablet Take 325 mg by mouth 2 (two) times daily.        . Hydrocodone-Acetaminophen 5-300 MG TABS TAKE 1 TABLET BY MOUTH EVERY 6 HOURS AS  NEEDED FOR PAIN  60 each  0  . Magnesium 250 MG TABS Take 1 tablet by mouth daily.      Marland Kitchen morphine (MS CONTIN) 30 MG 12 hr tablet Take 1 tablet by mouth every 8 hours  90 tablet  0  . [DISCONTINUED] warfarin (COUMADIN) 5 MG tablet Take 5 mg by mouth daily.         No current facility-administered medications for this visit.    SURGICAL HISTORY:  Past Surgical History  Procedure Laterality Date  . Abdominal hysterectomy    . Surgery right leg, rod and pin      REVIEW OF SYSTEMS:  A comprehensive review of systems was negative except for: Constitutional: positive for fatigue Respiratory: positive for dyspnea on  exertion Skin rash on the face and arm   PHYSICAL EXAMINATION: General appearance: alert, cooperative, fatigued and no distress Head: Normocephalic, without obvious abnormality, atraumatic Neck: no adenopathy Lymph nodes: Cervical, supraclavicular, and axillary nodes normal. Resp: diminished breath sounds RLL, RML and RUL and dullness to percussion RLL, RML and RUL Cardio: regular rate and rhythm, S1, S2 normal, no murmur, click, rub or gallop GI: soft, non-tender; bowel sounds normal; no masses,  no organomegaly Extremities: extremities normal, atraumatic, no cyanosis or edema Neurologic: Alert and oriented X 3, normal strength and tone. Normal symmetric reflexes. Normal coordination and gait  ECOG PERFORMANCE STATUS: 1 - Symptomatic but completely ambulatory  Blood pressure 132/88, pulse 103, temperature 97 F (36.1 C), temperature source Oral, resp. rate 18, height 5\' 7"  (1.702 m), weight 145 lb 6.4 oz (65.953 kg).  LABORATORY DATA: Lab Results  Component Value Date   WBC 4.3 02/12/2013   HGB 10.6* 02/12/2013   HCT 31.8* 02/12/2013   MCV 85.9 02/12/2013   PLT 235 02/12/2013      Chemistry      Component Value Date/Time   NA 137 01/17/2013 1441   NA 136 05/09/2012 1321   NA 137 09/21/2011 0902   K 3.8 01/17/2013 1441   K 4.3 05/09/2012 1321   K 4.2 09/21/2011 0902   CL 103 01/17/2013 1441   CL 101 05/09/2012 1321   CL 101 09/21/2011 0902   CO2 25 01/17/2013 1441   CO2 24 05/09/2012 1321   CO2 28 09/21/2011 0902   BUN 13.6 01/17/2013 1441   BUN 12 05/09/2012 1321   BUN 16 09/21/2011 0902   CREATININE 0.8 01/17/2013 1441   CREATININE 0.72 05/09/2012 1321   CREATININE 1.0 09/21/2011 0902      Component Value Date/Time   CALCIUM 8.7 01/17/2013 1441   CALCIUM 9.0 05/09/2012 1321   CALCIUM 8.7 09/21/2011 0902   ALKPHOS 144 01/17/2013 1441   ALKPHOS 144* 05/09/2012 1321   ALKPHOS 130* 09/21/2011 0902   AST 19 01/17/2013 1441   AST 22 05/09/2012 1321   AST 27 09/21/2011 0902   ALT 11 01/17/2013  1441   ALT 16 05/09/2012 1321   BILITOT 0.21 01/17/2013 1441   BILITOT 0.5 05/09/2012 1321   BILITOT 0.60 09/21/2011 0902       RADIOGRAPHIC STUDIES: Ct Chest W Contrast  02/08/2013  *RADIOLOGY REPORT*  Clinical Data:  Metastatic non-small cell lung cancer  CT CHEST, ABDOMEN AND PELVIS WITH CONTRAST  Technique:  Multidetector CT imaging of the chest, abdomen and pelvis was performed following the standard protocol during bolus administration of intravenous contrast.  Contrast: OMNIPAQUE IOHEXOL 300 MG/ML  SOLN  Comparison:  11/02/12    CT CHEST  Findings:  Lungs/pleura: Again noted is right lower lobe lung mass imbedded within atelectatic right lower lobe.  The mass has ill defined margins but measures approximately 3.2 x 3.6 cm, image 29/series 2.  This is compared with 3.2 x 3.5 cm previously.  The right lower lobe bronchus  remains completely occluded.  Heart/Mediastinum: There is no suspicious pulmonary nodule or mass identified within the left lung.  Rightward shift of the mediastinum identified.  Prevascular lymph node measures 1.7 cm, image 13/series 2.  Previously 1.4 cm.  The right paratracheal lymph node measures 0.8 cm, image 17/series 2.  This is compared with 0.7 cm previously.  Bones/Musculoskeletal:  Review of the visualized osseous structures again shows multifocal sclerotic bone metastases including the left humerus and upper thoracic spine  IMPRESSION:  1.  No significant change in size of lung mass which is imbedded in atelectatic right lower lobe. 2.  Slight increase in size of mediastinal lymph nodes. 3.  Similar appearance of sclerotic bone metastases.    CT ABDOMEN AND PELVIS  Findings:  Stable low attenuation structure within the posterior right hepatic lobe measuring 8 mm, image 42/series 2.  The gallbladder is normal.  No biliary dilatation.  Normal appearance of the pancreas.  The spleen is normal.  The adrenal glands are unremarkable.  The right kidney is normal.  The left  kidney is normal.  The urinary bladder is unremarkable.  Normal caliber of the abdominal aorta.  Calcified atherosclerotic changes involve the aorta.  There is no aneurysm.  No adenopathy identified within the upper abdomen.  There is no pelvic or inguinal adenopathy noted.  No free fluid or abnormal fluid collections identified within the upper abdomen or the pelvis.  Small hiatal hernia is noted.  The stomach is otherwise unremarkable.  The small bowel loops are normal.  Normal appearance of the colon.  Review of the visualized osseous structures shows multifocal sclerotic bone metastasis involving the lower lumbar spine, sacrum and right iliac bone.  Bilateral femurs are also involved.  When compared with previous exam there is been slight progression of sclerosis involving the right femoral head.  There has also been increase involvement of the proximal left femur at the level of the trochanters.  IMPRESSION:  1.  No evidence for solid organ or lymph node metastasis within the abdomen or pelvis. 2.  Stable to slight progression of sclerotic bone metastases.   Original Report Authenticated By: Signa Kell, M.D.    ASSESSMENT: this is a very pleasant 62 years old white female with metastatic non-small cell lung cancer, adenocarcinoma diagnosed in December of 2010 with positive EGFR mutation at exon 21. She status post 3 years treatment with Tarceva discontinued recently secondary to disease progression and the patient was switched to a treatment with Gilotrif. CT scan of the chest, abdomen and pelvis performed recently showed no significant evidence for disease progression except for slight increase in the size of mediastinal lymph node.   PLAN: I have a lengthy discussion with the patient and her family today about her condition. I recommended for her to continue with the same treatment with Gilotrif 40 mg by mouth daily. For the skin rash the patient will continue on clindamycin lotion. She was also  advised to take Imodium for any frequent episodes of diarrhea. For the bone metastasis, the patient will continue treatment with Xgeva 120 mcg subcutaneously every 4 weeks. She would come back for follow up visit in one month's for reevaluation and repeat blood work. She  was advised to call immediately if she has any concerning symptoms in the interval.  All questions were answered. The patient knows to call the clinic with any problems, questions or concerns. We can certainly see the patient much sooner if necessary.  I spent 15 minutes counseling the patient face to face. The total time spent in the appointment was 25 minutes.

## 2013-02-12 NOTE — Telephone Encounter (Signed)
gave pt appt for lab, MD injection for june 2014

## 2013-02-12 NOTE — Patient Instructions (Signed)
Continue treatment with Gilotrif. Followup visit in one month

## 2013-02-13 ENCOUNTER — Other Ambulatory Visit: Payer: Managed Care, Other (non HMO) | Admitting: Lab

## 2013-02-13 ENCOUNTER — Ambulatory Visit: Payer: Managed Care, Other (non HMO)

## 2013-02-13 ENCOUNTER — Other Ambulatory Visit: Payer: Self-pay | Admitting: Physician Assistant

## 2013-02-13 ENCOUNTER — Ambulatory Visit: Payer: Managed Care, Other (non HMO) | Admitting: Internal Medicine

## 2013-02-14 ENCOUNTER — Telehealth: Payer: Self-pay | Admitting: Medical Oncology

## 2013-02-14 NOTE — Telephone Encounter (Signed)
Confirmation of Gilotrif  shipment.

## 2013-03-06 ENCOUNTER — Other Ambulatory Visit: Payer: Self-pay | Admitting: Physician Assistant

## 2013-03-11 ENCOUNTER — Other Ambulatory Visit: Payer: Managed Care, Other (non HMO) | Admitting: Lab

## 2013-03-11 ENCOUNTER — Other Ambulatory Visit: Payer: Self-pay | Admitting: Medical Oncology

## 2013-03-11 MED ORDER — AFATINIB DIMALEATE 40 MG PO TABS: 40.0000 mg | ORAL_TABLET | Freq: Every day | ORAL | Status: DC

## 2013-03-11 NOTE — Telephone Encounter (Signed)
Called in gilotrif per Dr Arbutus Ped.

## 2013-03-12 ENCOUNTER — Other Ambulatory Visit: Payer: Self-pay | Admitting: Medical Oncology

## 2013-03-12 ENCOUNTER — Other Ambulatory Visit: Payer: Self-pay | Admitting: *Deleted

## 2013-03-12 NOTE — Telephone Encounter (Signed)
THIS REFILL REQUEST FOR GILOTRIF WAS GIVEN TO DR.MOHAMED'S NURSE, DIANE BELL,RN.

## 2013-03-12 NOTE — Telephone Encounter (Signed)
Gilotrif order sent to Dr Arbutus Ped for authorization.

## 2013-03-13 ENCOUNTER — Encounter: Payer: Self-pay | Admitting: *Deleted

## 2013-03-13 ENCOUNTER — Other Ambulatory Visit: Payer: Self-pay | Admitting: Medical Oncology

## 2013-03-13 ENCOUNTER — Other Ambulatory Visit (HOSPITAL_BASED_OUTPATIENT_CLINIC_OR_DEPARTMENT_OTHER): Payer: Managed Care, Other (non HMO) | Admitting: Lab

## 2013-03-13 ENCOUNTER — Encounter: Payer: Self-pay | Admitting: Internal Medicine

## 2013-03-13 ENCOUNTER — Ambulatory Visit (HOSPITAL_BASED_OUTPATIENT_CLINIC_OR_DEPARTMENT_OTHER): Payer: Managed Care, Other (non HMO) | Admitting: Internal Medicine

## 2013-03-13 ENCOUNTER — Ambulatory Visit (HOSPITAL_BASED_OUTPATIENT_CLINIC_OR_DEPARTMENT_OTHER): Payer: Managed Care, Other (non HMO)

## 2013-03-13 DIAGNOSIS — R21 Rash and other nonspecific skin eruption: Secondary | ICD-10-CM

## 2013-03-13 DIAGNOSIS — C3430 Malignant neoplasm of lower lobe, unspecified bronchus or lung: Secondary | ICD-10-CM

## 2013-03-13 DIAGNOSIS — C7952 Secondary malignant neoplasm of bone marrow: Secondary | ICD-10-CM

## 2013-03-13 DIAGNOSIS — R5381 Other malaise: Secondary | ICD-10-CM

## 2013-03-13 LAB — CBC WITH DIFFERENTIAL/PLATELET
BASO%: 0.3 % (ref 0.0–2.0)
Basophils Absolute: 0 10*3/uL (ref 0.0–0.1)
EOS%: 1.1 % (ref 0.0–7.0)
HCT: 31.8 % — ABNORMAL LOW (ref 34.8–46.6)
HGB: 11 g/dL — ABNORMAL LOW (ref 11.6–15.9)
MCH: 29.3 pg (ref 25.1–34.0)
MCHC: 34.6 g/dL (ref 31.5–36.0)
MCV: 84.8 fL (ref 79.5–101.0)
MONO%: 9 % (ref 0.0–14.0)
NEUT%: 72.6 % (ref 38.4–76.8)
RDW: 13 % (ref 11.2–14.5)
lymph#: 1.1 10*3/uL (ref 0.9–3.3)

## 2013-03-13 LAB — COMPREHENSIVE METABOLIC PANEL (CC13)
ALT: 6 U/L (ref 0–55)
AST: 14 U/L (ref 5–34)
CO2: 26 mEq/L (ref 22–29)
Chloride: 102 mEq/L (ref 98–107)
Creatinine: 0.9 mg/dL (ref 0.6–1.1)
Sodium: 135 mEq/L — ABNORMAL LOW (ref 136–145)
Total Bilirubin: 0.2 mg/dL (ref 0.20–1.20)
Total Protein: 7.1 g/dL (ref 6.4–8.3)

## 2013-03-13 MED ORDER — DOXYCYCLINE HYCLATE 100 MG PO TABS: 100.0000 mg | ORAL_TABLET | Freq: Two times a day (BID) | ORAL | Status: DC

## 2013-03-13 MED ORDER — MORPHINE SULFATE ER 30 MG PO TBCR
EXTENDED_RELEASE_TABLET | ORAL | Status: DC
Start: 2013-03-13 — End: 2013-04-09

## 2013-03-13 MED ORDER — DENOSUMAB 120 MG/1.7ML ~~LOC~~ SOLN
120.0000 mg | Freq: Once | SUBCUTANEOUS | Status: AC
  Administered 2013-03-13: 120 mg via SUBCUTANEOUS
  Filled 2013-03-13: qty 1.7

## 2013-03-13 NOTE — Telephone Encounter (Signed)
Fax from [facility] that Gilotrif has been scheduled to be shipped on 03/14/13.

## 2013-03-13 NOTE — Telephone Encounter (Signed)
Gave pt appt for lab and MD on July with injections

## 2013-03-13 NOTE — Telephone Encounter (Signed)
refilll sent to Dr Arbutus Ped for authorization.

## 2013-03-13 NOTE — Telephone Encounter (Signed)
Faxed written order for gilotrif to [facility].

## 2013-03-13 NOTE — Progress Notes (Signed)
[facility] [phone]   [phone]  OFFICE PROGRESS NOTE  [name], MD [address] [facility], [place]. 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   PRIOR THERAPY:  1. Status post palliative radiotherapy to the right hilum as well as anterior inferior ribs and lumbar spine from L2-L5 under the care Dr. Shon Baton.  2. status post right hip prophylactic trochanteric femoral nail and under the care of Dr. Eulas Post  3. palliative radiotherapy to his lumbar spine under the care of Dr.Wentworth  4. Tarceva at 150 mg by mouth daily status post 37 months of treatment   CURRENT THERAPY:  1. Gilotrif 40 mg by mouth daily. Therapy started 12/17/2012, status post approximately 6 weeks of therapy  2. Xgeva 120 mg subcutaneously given on a monthly basis for bone metastasis status post 16 treatments    INTERVAL HISTORY: Makayla [name] 62 y.o. female returns to the clinic today for followup visit accompanied her sister. The patient is feeling fine today with no specific complaints except for fatigue and she had infection in the right middle finger started as a small pimple and the patient removed the scabs and made it more inflamed. She has few episodes of diarrhea. Otherwise she is tolerating her treatment with Gilotrif fairly well. She lost a few pounds recently. She denied having any significant chest pain but continues to have shortness of breath with exertion, no cough or hemoptysis.  MEDICAL HISTORY: Past Medical History  Diagnosis Date  . COPD (chronic obstructive pulmonary disease)   . Hx of radiation therapy   . Hx antineoplastic chemotherapy   . Shortness of breath   . GERD (gastroesophageal reflux disease)   . Lung cancer     lung ca dx 12/10    . Lung cancer 09/01/2011    bone mets    ALLERGIES:  is allergic to sulfonamide derivatives.  MEDICATIONS:  Current Outpatient Prescriptions  Medication Sig Dispense Refill  . afatinib dimaleate (GILOTRIF) 40 MG tablet Take 1 tablet (40 mg total) by mouth daily. Take on an empty stomach 1hr before or 2 hrs after meals.  30 tablet  0  . ALPRAZolam (XANAX) 0.5 MG tablet Take 0.5 mg by mouth at bedtime as needed. For sleep      . calcium-vitamin D (OSCAL WITH D) 500-200 MG-UNIT per tablet Take 1 tablet by mouth daily.        . clindamycin (CLEOCIN T) 1 % external solution Apply 1 application topically 2 (two) times daily as needed. Apply as needed to scalp      . denosumab (XGEVA) 120 MG/1.7ML SOLN Inject 120 mg into the skin every 30 (thirty) days.       . diphenhydramine-acetaminophen (TYLENOL PM) 25-500 MG TABS Take 1 tablet by mouth at bedtime as needed.      . ferrous sulfate 325 (65 FE) MG tablet Take 325 mg by mouth 2 (two) times daily.        . Hydrocodone-Acetaminophen 5-300 MG TABS TAKE 1 TABLET BY MOUTH EVERY 6 HOURS AS NEEDED FOR PAIN  60 each  0  . Magnesium 250 MG TABS Take 1 tablet by mouth daily.      Marland Kitchen morphine (MS CONTIN) 30 MG 12 hr tablet Take 1 tablet by mouth every  8 hours  90 tablet  0  . [DISCONTINUED] warfarin (COUMADIN) 5 MG tablet Take 5 mg by mouth daily.         No current facility-administered medications for this visit.   Facility-Administered Medications Ordered in Other Visits  Medication Dose Route Frequency Provider Last Rate Last Dose  . denosumab (XGEVA) injection 120 mg  120 mg Subcutaneous Once [name], MD        SURGICAL HISTORY:  Past Surgical History  Procedure Laterality Date  . Abdominal hysterectomy    . Surgery right leg, rod and pin      REVIEW OF SYSTEMS:  A comprehensive review of systems was negative except for: Constitutional: positive for fatigue and weight loss Respiratory: positive for dyspnea on exertion Infection of  the right middle finger.   PHYSICAL EXAMINATION: General appearance: alert, cooperative, fatigued and no distress Head: Normocephalic, without obvious abnormality, atraumatic Neck: no adenopathy Lymph nodes: Cervical, supraclavicular, and axillary nodes normal. Resp: diminished breath sounds RLL, RML and RUL and dullness to percussion RLL, RML and RUL Cardio: regular rate and rhythm, S1, S2 normal, no murmur, click, rub or gallop GI: soft, non-tender; bowel sounds normal; no masses,  no organomegaly Extremities: extremities normal, atraumatic, no cyanosis or edema Neurologic: Alert and oriented X 3, normal strength and tone. Normal symmetric reflexes. Normal coordination and gait  ECOG PERFORMANCE STATUS: 1 - Symptomatic but completely ambulatory  Blood pressure 122/71, pulse 104, temperature 97.6 F (36.4 C), temperature source Oral, resp. rate 18, height 5\' 7"  (1.702 m), weight 141 lb 8 oz (64.184 kg).  LABORATORY DATA: Lab Results  Component Value Date   WBC 6.7 03/13/2013   HGB 11.0* 03/13/2013   HCT 31.8* 03/13/2013   MCV 84.8 03/13/2013   PLT 237 03/13/2013      Chemistry      Component Value Date/Time   NA 135* 03/13/2013 1023   NA 136 05/09/2012 1321   NA 137 09/21/2011 0902   K 4.4 03/13/2013 1023   K 4.3 05/09/2012 1321   K 4.2 09/21/2011 0902   CL 102 03/13/2013 1023   CL 101 05/09/2012 1321   CL 101 09/21/2011 0902   CO2 26 03/13/2013 1023   CO2 24 05/09/2012 1321   CO2 28 09/21/2011 0902   BUN 12.7 03/13/2013 1023   BUN 12 05/09/2012 1321   BUN 16 09/21/2011 0902   CREATININE 0.9 03/13/2013 1023   CREATININE 0.72 05/09/2012 1321   CREATININE 1.0 09/21/2011 0902      Component Value Date/Time   CALCIUM 9.2 03/13/2013 1023   CALCIUM 9.0 05/09/2012 1321   CALCIUM 8.7 09/21/2011 0902   ALKPHOS 149 03/13/2013 1023   ALKPHOS 144* 05/09/2012 1321   ALKPHOS 130* 09/21/2011 0902   AST 14 03/13/2013 1023   AST 22 05/09/2012 1321   AST 27 09/21/2011 0902   ALT 6 03/13/2013 1023   ALT 16  05/09/2012 1321   BILITOT 0.20 03/13/2013 1023   BILITOT 0.5 05/09/2012 1321   BILITOT 0.60 09/21/2011 0902       RADIOGRAPHIC STUDIES: No results found.  ASSESSMENT: This is a very pleasant 62 years old white female with metastatic non-small cell lung cancer, adenocarcinoma with positive EGFR mutation in exon 21 currently undergoing treatment with Gilotrif 40 mg by mouth daily status post 3 months   PLAN: The patient is tolerating her treatment fairly well except for mild skin rash and few episodes of diarrhea. I recommended for her to continue treatment  with Gilotrif at the same dose but she would have the dose for a few days until the infection of the right middle finger improved. For the right middle finger infection, I will start the patient on doxycycline 100 mg by mouth twice a day for 10 days. She will also apply clindamycin lotion to this area. She was advised to seek surgical evaluation if no improvement in the next few days. The patient would come back for followup visit in one month's for reevaluation. She was given a refill of MS Contin 30 mg by mouth every 12 hours for pain management. She was advised to call immediately if she has any concerning symptoms in the interval.  All questions were answered. The patient knows to call the clinic with any problems, questions or concerns. We can certainly see the patient much sooner if necessary.

## 2013-03-26 ENCOUNTER — Other Ambulatory Visit: Payer: Self-pay | Admitting: Internal Medicine

## 2013-04-01 ENCOUNTER — Telehealth: Payer: Self-pay | Admitting: *Deleted

## 2013-04-01 NOTE — Telephone Encounter (Signed)
Pt called stating that since this past Thursday she has been having issues with diarrhea.  She has been taking imodium AD as directed.  She said that today and yesterday she has only had 1 loose stool so she thinks it is getting better.  Advised she continue to take imodium if she needs it, also recommended she get the regular imodium so she can take more tablets in one day.  Encouraged increased fluid intake.  Asked her to call back by Thursday (before the holiday) if she does not see any improvement.  She verbalized understanding.  SLJ

## 2013-04-02 ENCOUNTER — Telehealth: Payer: Self-pay | Admitting: *Deleted

## 2013-04-02 NOTE — Telephone Encounter (Signed)
04/02/2013 Spoke with patient by phone today regarding follow up discussion for the BMS [id number] study. Patient states that she is interested in the study but that she had a few questions, pertaining to blood draws and concerns for privacy of her information if released to sources not covered by HIPAA regulations. Explanations given to patient regarding frequency and amount of blood draws, stressing that she will not be asked to have any additional blood sample collections, and noting that the frequency may vary depending on the treatment she is undergoing and/or routine visit frequency. Also explained to patient that information sent outside the health system for the purposes of the study will not include patient identifiers. Patient felt that the questions were answered to her satisfaction and she would like to proceed with study enrollment. Plans made for patient to arrive early for next week's appointments, at 2:45pm, to meet with research nurse prior to her lab appointment. Thanked patient for her interest in the trial. Cindy S. Clelia Croft BSN, RN, CCRP 04/02/2013 10:49 AM

## 2013-04-04 ENCOUNTER — Telehealth: Payer: Self-pay | Admitting: *Deleted

## 2013-04-04 NOTE — Telephone Encounter (Signed)
Pt called to say she had diarrhea at 7:00pm Wednesday and took 1 regular imodium, 3:00am Thursday and took 1 imodium, had a very small amount at 11:20. I instructed her to take another imodium and I will call her ~ 1:00 today to see how she is doing.

## 2013-04-06 ENCOUNTER — Other Ambulatory Visit: Payer: Self-pay | Admitting: Oncology

## 2013-04-06 DIAGNOSIS — R197 Diarrhea, unspecified: Secondary | ICD-10-CM

## 2013-04-06 MED ORDER — DIPHENOXYLATE-ATROPINE 2.5-0.025 MG PO TABS: 1.0000 | ORAL_TABLET | Freq: Four times a day (QID) | ORAL | Status: DC | PRN

## 2013-04-09 ENCOUNTER — Other Ambulatory Visit: Payer: Self-pay | Admitting: Medical Oncology

## 2013-04-09 ENCOUNTER — Telehealth: Payer: Self-pay | Admitting: Internal Medicine

## 2013-04-09 ENCOUNTER — Telehealth: Payer: Self-pay | Admitting: Medical Oncology

## 2013-04-09 MED ORDER — MORPHINE SULFATE ER 30 MG PO TBCR: EXTENDED_RELEASE_TABLET | ORAL | Status: DC

## 2013-04-09 NOTE — Telephone Encounter (Signed)
Per [name] pt may restart Gilotrif and to call us if it comes back . Pt also requested refill on pain med. Refill sent to [name]

## 2013-04-09 NOTE — Telephone Encounter (Signed)
Rx signed and locked in injection room. Pt will pick up rx thursday

## 2013-04-09 NOTE — Telephone Encounter (Signed)
s.w. pt and r/s apt to monday per Cindy shaw ok...due to Dr. Kerry Fort being on call

## 2013-04-10 ENCOUNTER — Ambulatory Visit: Payer: Managed Care, Other (non HMO) | Admitting: Internal Medicine

## 2013-04-10 ENCOUNTER — Ambulatory Visit: Payer: Managed Care, Other (non HMO)

## 2013-04-10 ENCOUNTER — Other Ambulatory Visit: Payer: Managed Care, Other (non HMO) | Admitting: Lab

## 2013-04-10 ENCOUNTER — Encounter: Payer: Managed Care, Other (non HMO) | Admitting: *Deleted

## 2013-04-12 ENCOUNTER — Other Ambulatory Visit: Payer: Self-pay | Admitting: Medical Oncology

## 2013-04-12 ENCOUNTER — Other Ambulatory Visit: Payer: Self-pay | Admitting: Internal Medicine

## 2013-04-12 NOTE — Telephone Encounter (Signed)
Called in refill for hydrocodone

## 2013-04-15 ENCOUNTER — Encounter: Payer: Managed Care, Other (non HMO) | Admitting: *Deleted

## 2013-04-15 ENCOUNTER — Encounter: Payer: Self-pay | Admitting: Internal Medicine

## 2013-04-15 ENCOUNTER — Telehealth: Payer: Self-pay | Admitting: Internal Medicine

## 2013-04-15 ENCOUNTER — Ambulatory Visit (HOSPITAL_BASED_OUTPATIENT_CLINIC_OR_DEPARTMENT_OTHER): Payer: Managed Care, Other (non HMO) | Admitting: Internal Medicine

## 2013-04-15 ENCOUNTER — Other Ambulatory Visit (HOSPITAL_BASED_OUTPATIENT_CLINIC_OR_DEPARTMENT_OTHER): Payer: Managed Care, Other (non HMO) | Admitting: Lab

## 2013-04-15 ENCOUNTER — Ambulatory Visit (HOSPITAL_BASED_OUTPATIENT_CLINIC_OR_DEPARTMENT_OTHER): Payer: Managed Care, Other (non HMO)

## 2013-04-15 ENCOUNTER — Ambulatory Visit: Payer: Managed Care, Other (non HMO) | Admitting: Internal Medicine

## 2013-04-15 VITALS — BP 122/85 | HR 133 | Temp 96.9°F | Resp 18 | Ht 67.0 in | Wt 139.2 lb

## 2013-04-15 DIAGNOSIS — C7951 Secondary malignant neoplasm of bone: Secondary | ICD-10-CM

## 2013-04-15 DIAGNOSIS — C3430 Malignant neoplasm of lower lobe, unspecified bronchus or lung: Secondary | ICD-10-CM

## 2013-04-15 DIAGNOSIS — R634 Abnormal weight loss: Secondary | ICD-10-CM

## 2013-04-15 DIAGNOSIS — R0602 Shortness of breath: Secondary | ICD-10-CM

## 2013-04-15 DIAGNOSIS — C7952 Secondary malignant neoplasm of bone marrow: Secondary | ICD-10-CM

## 2013-04-15 LAB — CBC WITH DIFFERENTIAL/PLATELET
BASO%: 0.3 % (ref 0.0–2.0)
Eosinophils Absolute: 0.1 10*3/uL (ref 0.0–0.5)
HCT: 33.5 % — ABNORMAL LOW (ref 34.8–46.6)
LYMPH%: 19.3 % (ref 14.0–49.7)
MCHC: 33.9 g/dL (ref 31.5–36.0)
MCV: 83.9 fL (ref 79.5–101.0)
MONO#: 0.6 10*3/uL (ref 0.1–0.9)
MONO%: 7.7 % (ref 0.0–14.0)
NEUT%: 71.7 % (ref 38.4–76.8)
Platelets: 287 10*3/uL (ref 145–400)
WBC: 7.7 10*3/uL (ref 3.9–10.3)

## 2013-04-15 LAB — COMPREHENSIVE METABOLIC PANEL (CC13)
CO2: 26 mEq/L (ref 22–29)
Creatinine: 0.8 mg/dL (ref 0.6–1.1)
Glucose: 112 mg/dl (ref 70–140)
Total Bilirubin: <0.2 mg/dL (ref 0.20–1.20)

## 2013-04-15 MED ORDER — METHYLPREDNISOLONE 4 MG PO KIT: PACK | ORAL | Status: DC

## 2013-04-15 MED ORDER — DENOSUMAB 120 MG/1.7ML ~~LOC~~ SOLN
120.0000 mg | Freq: Once | SUBCUTANEOUS | Status: AC
  Administered 2013-04-15: 120 mg via SUBCUTANEOUS
  Filled 2013-04-15: qty 1.7

## 2013-04-15 NOTE — Progress Notes (Signed)
[facility] [phone]   Fax:(336) [phone]  OFFICE PROGRESS NOTE  [name], MD [address] [facility], [place]. 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   PRIOR THERAPY:  1. Status post palliative radiotherapy to the right hilum as well as anterior inferior ribs and lumbar spine from L2-L5 under the care Dr. Shon Baton.  2. status post right hip prophylactic trochanteric femoral nail and under the care of Dr. Eulas Post  3. palliative radiotherapy to his lumbar spine under the care of Dr.Wentworth  4. Tarceva at 150 mg by mouth daily status post 37 months of treatment   CURRENT THERAPY:  1. Gilotrif 40 mg by mouth daily. Therapy started 12/17/2012, status post approximately 6 weeks of therapy  2. Xgeva 120 mg subcutaneously given on a monthly basis for bone metastasis status post 16 treatments    INTERVAL HISTORY: Makayla [name] 62 y.o. female returns to the clinic today for followup visit accompanied by her sister and son. The patient is feeling fine today with no specific complaints except for lack of appetite and she lost 2 pounds since her last visit. She denied having any significant chest pain but continues to have shortness breath with exertion. The patient has some numbness at the left neck area close to the area of previous radiotherapy to the enlarged supraclavicular lymph nodes. She denied having any significant nausea or vomiting. She denied having any diarrhea or significant skin rash. The patient is tolerating her treatment with Gilotrif fairly well.  MEDICAL HISTORY: Past Medical History  Diagnosis Date  . COPD (chronic obstructive pulmonary disease)   . Hx of radiation therapy   . Hx antineoplastic chemotherapy   . Shortness of breath     . GERD (gastroesophageal reflux disease)   . Lung cancer     lung ca dx 12/10  . Lung cancer 09/01/2011    bone mets    ALLERGIES:  is allergic to sulfonamide derivatives.  MEDICATIONS:  Current Outpatient Prescriptions  Medication Sig Dispense Refill  . afatinib dimaleate (GILOTRIF) 40 MG tablet Take 1 tablet (40 mg total) by mouth daily. Take on an empty stomach 1hr before or 2 hrs after meals.  30 tablet  0  . ALPRAZolam (XANAX) 0.5 MG tablet Take 0.5 mg by mouth at bedtime as needed. For sleep      . calcium-vitamin D (OSCAL WITH D) 500-200 MG-UNIT per tablet Take 1 tablet by mouth daily.        . clindamycin (CLEOCIN T) 1 % external solution Apply 1 application topically 2 (two) times daily as needed. Apply as needed to scalp      . denosumab (XGEVA) 120 MG/1.7ML SOLN Inject 120 mg into the skin every 30 (thirty) days.       . diphenhydramine-acetaminophen (TYLENOL PM) 25-500 MG TABS Take 1 tablet by mouth at bedtime as needed.      . diphenoxylate-atropine (LOMOTIL) 2.5-0.025 MG per tablet Take 1 tablet by mouth 4 (four) times daily as needed for diarrhea or loose stools.  30 tablet  0  . doxycycline (VIBRA-TABS) 100 MG tablet Take 1 tablet (100 mg total) by mouth 2 (two) times daily.  20 tablet  0  . ferrous sulfate 325 (65 FE) MG tablet Take 325 mg by mouth  2 (two) times daily.        . Hydrocodone-Acetaminophen 5-300 MG TABS TAKE 1 TABLET BY MOUTH EVERY 6 HOURS AS NEEDED FOR PAIN  60 each  0  . Magnesium 250 MG TABS Take 1 tablet by mouth daily.      Marland Kitchen morphine (MS CONTIN) 30 MG 12 hr tablet Take 1 tablet by mouth every 8 hours  90 tablet  0  . [DISCONTINUED] warfarin (COUMADIN) 5 MG tablet Take 5 mg by mouth daily.         No current facility-administered medications for this visit.   Facility-Administered Medications Ordered in Other Visits  Medication Dose Route Frequency Provider Last Rate Last Dose  . denosumab (XGEVA) injection 120 mg  120 mg Subcutaneous Once [name], MD        SURGICAL HISTORY:  Past Surgical History  Procedure Laterality Date  . Abdominal hysterectomy    . Surgery right leg, rod and pin      REVIEW OF SYSTEMS:  A comprehensive review of systems was negative except for: Constitutional: positive for anorexia and weight loss   PHYSICAL EXAMINATION: General appearance: alert, cooperative and no distress Head: Normocephalic, without obvious abnormality, atraumatic Neck: no adenopathy Lymph nodes: Cervical, supraclavicular, and axillary nodes normal. Resp: diminished breath sounds RLL, RML and RUL and dullness to percussion RLL, RML and RUL Cardio: regular rate and rhythm, S1, S2 normal, no murmur, click, rub or gallop GI: soft, non-tender; bowel sounds normal; no masses,  no organomegaly Extremities: extremities normal, atraumatic, no cyanosis or edema Neurologic: Alert and oriented X 3, normal strength and tone. Normal symmetric reflexes. Normal coordination and gait  ECOG PERFORMANCE STATUS: 1 - Symptomatic but completely ambulatory  Blood pressure 122/85, pulse 133, temperature 96.9 F (36.1 C), temperature source Oral, resp. rate 18, height 5\' 7"  (1.702 m), weight 139 lb 3.2 oz (63.141 kg).  LABORATORY DATA: Lab Results  Component Value Date   WBC 7.7 04/15/2013   HGB 11.4* 04/15/2013   HCT 33.5* 04/15/2013   MCV 83.9 04/15/2013   PLT 287 04/15/2013      Chemistry      Component Value Date/Time   NA 135* 03/13/2013 1023   NA 136 05/09/2012 1321   NA 137 09/21/2011 0902   K 4.4 03/13/2013 1023   K 4.3 05/09/2012 1321   K 4.2 09/21/2011 0902   CL 102 03/13/2013 1023   CL 101 05/09/2012 1321   CL 101 09/21/2011 0902   CO2 26 03/13/2013 1023   CO2 24 05/09/2012 1321   CO2 28 09/21/2011 0902   BUN 12.7 03/13/2013 1023   BUN 12 05/09/2012 1321   BUN 16 09/21/2011 0902   CREATININE 0.9 03/13/2013 1023   CREATININE 0.72 05/09/2012 1321   CREATININE 1.0 09/21/2011 0902      Component Value Date/Time   CALCIUM 9.2 03/13/2013  1023   CALCIUM 9.0 05/09/2012 1321   CALCIUM 8.7 09/21/2011 0902   ALKPHOS 149 03/13/2013 1023   ALKPHOS 144* 05/09/2012 1321   ALKPHOS 130* 09/21/2011 0902   AST 14 03/13/2013 1023   AST 22 05/09/2012 1321   AST 27 09/21/2011 0902   ALT 6 03/13/2013 1023   ALT 16 05/09/2012 1321   BILITOT 0.20 03/13/2013 1023   BILITOT 0.5 05/09/2012 1321   BILITOT 0.60 09/21/2011 0902       RADIOGRAPHIC STUDIES: No results found.  ASSESSMENT AND PLAN: This is a very pleasant 62 years old white female with metastatic  non-small cell lung cancer with positive EGFR mutation in exon 21 currently on treatment with Gilotrif 40 mg by mouth daily status post 2 months of treatment. I recommended for the patient to continue her current treatment with Gilotrif for one more month. I would see her back for followup visit in one month with repeat MRI of the brain as well as CT scan of the neck, chest, abdomen and pelvis for restaging of her disease. For the lack of appetite and weight loss, I started the patient on Medrol Dosepak. She was advised to call immediately if she has any concerning symptoms in the interval.  All questions were answered. The patient knows to call the clinic with any problems, questions or concerns. We can certainly see the patient much sooner if necessary.  I spent 15 minutes counseling the patient face to face. The total time spent in the appointment was 25 minutes.

## 2013-04-15 NOTE — Patient Instructions (Signed)
Continue treatment with Gilotrif. Followup visit in one month with repeat MRI of the brain as well as CT scan of the neck, chest, abdomen and pelvis.

## 2013-04-15 NOTE — Telephone Encounter (Signed)
gv and printed appt sched and avs forlpt....gv pt barium

## 2013-04-16 MED ORDER — AFATINIB DIMALEATE 40 MG PO TABS: 40.0000 mg | ORAL_TABLET | Freq: Every day | ORAL | Status: DC

## 2013-04-16 NOTE — Addendum Note (Signed)
Addended by: Caren Griffins on: 04/16/2013 01:00 PM   Modules accepted: Orders

## 2013-04-16 NOTE — Progress Notes (Signed)
See Consent Form encounter notes regarding today's visit.

## 2013-04-19 ENCOUNTER — Telehealth: Payer: Self-pay | Admitting: *Deleted

## 2013-04-19 NOTE — Telephone Encounter (Signed)
Rx for gilotrif has been shipped SLJ

## 2013-04-25 ENCOUNTER — Telehealth: Payer: Self-pay | Admitting: Medical Oncology

## 2013-04-25 NOTE — Telephone Encounter (Signed)
Pt reports she has a draining abscess on her scalp like she had on her finger.Per Dr Arbutus Ped I told pt to apply clindamycin to the abscess and call if it gets worse.

## 2013-05-01 ENCOUNTER — Other Ambulatory Visit: Payer: Self-pay | Admitting: Internal Medicine

## 2013-05-01 DIAGNOSIS — C7952 Secondary malignant neoplasm of bone marrow: Secondary | ICD-10-CM

## 2013-05-01 DIAGNOSIS — C3490 Malignant neoplasm of unspecified part of unspecified bronchus or lung: Secondary | ICD-10-CM

## 2013-05-06 ENCOUNTER — Telehealth: Payer: Self-pay | Admitting: *Deleted

## 2013-05-06 MED ORDER — DOXYCYCLINE HYCLATE 100 MG PO TABS: 100.0000 mg | ORAL_TABLET | Freq: Two times a day (BID) | ORAL | Status: DC

## 2013-05-06 NOTE — Telephone Encounter (Signed)
Pt called stating she now has a 2nd abcess on her head.  Dr Donnald Garre was already aware of the 1st one but she was instructed to call back if anything changed.  They are both draining but she feels she may need an antibiotic.  Per dr Donnald Garre, okay to give doxycyline 100mg  BID x 10days.  She is to call back if anything worsens or changes.  She is to also continue to use clindamycin lotion on the sites.  SLJ

## 2013-05-13 ENCOUNTER — Other Ambulatory Visit (HOSPITAL_BASED_OUTPATIENT_CLINIC_OR_DEPARTMENT_OTHER): Payer: Managed Care, Other (non HMO) | Admitting: Lab

## 2013-05-13 ENCOUNTER — Ambulatory Visit (HOSPITAL_BASED_OUTPATIENT_CLINIC_OR_DEPARTMENT_OTHER): Payer: Managed Care, Other (non HMO)

## 2013-05-13 ENCOUNTER — Other Ambulatory Visit: Payer: Self-pay | Admitting: *Deleted

## 2013-05-13 ENCOUNTER — Telehealth: Payer: Self-pay | Admitting: *Deleted

## 2013-05-13 ENCOUNTER — Encounter (HOSPITAL_COMMUNITY): Payer: Self-pay

## 2013-05-13 ENCOUNTER — Ambulatory Visit (HOSPITAL_COMMUNITY)
Admission: RE | Admit: 2013-05-13 | Discharge: 2013-05-13 | Disposition: A | Discharge status: Home / Self Care | Payer: Managed Care, Other (non HMO) | Source: Ambulatory Visit | Attending: Internal Medicine | Admitting: Internal Medicine

## 2013-05-13 DIAGNOSIS — C7951 Secondary malignant neoplasm of bone: Secondary | ICD-10-CM | POA: Insufficient documentation

## 2013-05-13 DIAGNOSIS — C3490 Malignant neoplasm of unspecified part of unspecified bronchus or lung: Secondary | ICD-10-CM | POA: Insufficient documentation

## 2013-05-13 DIAGNOSIS — R930 Abnormal findings on diagnostic imaging of skull and head, not elsewhere classified: Secondary | ICD-10-CM | POA: Insufficient documentation

## 2013-05-13 DIAGNOSIS — C3430 Malignant neoplasm of lower lobe, unspecified bronchus or lung: Secondary | ICD-10-CM

## 2013-05-13 DIAGNOSIS — M47812 Spondylosis without myelopathy or radiculopathy, cervical region: Secondary | ICD-10-CM | POA: Insufficient documentation

## 2013-05-13 DIAGNOSIS — C7952 Secondary malignant neoplasm of bone marrow: Secondary | ICD-10-CM

## 2013-05-13 DIAGNOSIS — R599 Enlarged lymph nodes, unspecified: Secondary | ICD-10-CM | POA: Insufficient documentation

## 2013-05-13 DIAGNOSIS — Z79899 Other long term (current) drug therapy: Secondary | ICD-10-CM | POA: Insufficient documentation

## 2013-05-13 DIAGNOSIS — G9389 Other specified disorders of brain: Secondary | ICD-10-CM | POA: Insufficient documentation

## 2013-05-13 DIAGNOSIS — R51 Headache: Secondary | ICD-10-CM | POA: Insufficient documentation

## 2013-05-13 LAB — CBC WITH DIFFERENTIAL/PLATELET
Basophils Absolute: 0 10*3/uL (ref 0.0–0.1)
EOS%: 0.7 % (ref 0.0–7.0)
Eosinophils Absolute: 0.1 10*3/uL (ref 0.0–0.5)
HCT: 30.9 % — ABNORMAL LOW (ref 34.8–46.6)
HGB: 10.5 g/dL — ABNORMAL LOW (ref 11.6–15.9)
MCH: 28.8 pg (ref 25.1–34.0)
NEUT#: 6.1 10*3/uL (ref 1.5–6.5)
NEUT%: 77.8 % — ABNORMAL HIGH (ref 38.4–76.8)
lymph#: 1.1 10*3/uL (ref 0.9–3.3)

## 2013-05-13 LAB — COMPREHENSIVE METABOLIC PANEL (CC13)
AST: 15 U/L (ref 5–34)
BUN: 10.4 mg/dL (ref 7.0–26.0)
CO2: 25 mEq/L (ref 22–29)
Chloride: 98 mEq/L (ref 98–109)
Creatinine: 0.7 mg/dL (ref 0.6–1.1)
Glucose: 97 mg/dl (ref 70–140)
Potassium: 4.2 mEq/L (ref 3.5–5.1)
Total Protein: 6.9 g/dL (ref 6.4–8.3)

## 2013-05-13 MED ORDER — GADOBENATE DIMEGLUMINE 529 MG/ML IV SOLN
15.0000 mL | Freq: Once | INTRAVENOUS | Status: AC | PRN
  Administered 2013-05-13: 13 mL via INTRAVENOUS

## 2013-05-13 MED ORDER — DENOSUMAB 120 MG/1.7ML ~~LOC~~ SOLN
120.0000 mg | Freq: Once | SUBCUTANEOUS | Status: AC
  Administered 2013-05-13: 120 mg via SUBCUTANEOUS
  Filled 2013-05-13: qty 1.7

## 2013-05-13 MED ORDER — IOHEXOL 300 MG/ML  SOLN
100.0000 mL | Freq: Once | INTRAMUSCULAR | Status: AC | PRN
  Administered 2013-05-13: 100 mL via INTRAVENOUS

## 2013-05-13 NOTE — Telephone Encounter (Signed)
NOTIFIED OF INCOMING FAX REPORT. THE REPORT WAS RECEIVED AND GIVEN TO DR.MOHAMED.

## 2013-05-13 NOTE — Telephone Encounter (Signed)
THIS REFILL REQUEST FOR GILOTRIF WAS GIVEN TO DR.MOHAMED'S NURSE, DIANE BELL,RN.

## 2013-05-14 ENCOUNTER — Other Ambulatory Visit: Payer: Self-pay | Admitting: Internal Medicine

## 2013-05-14 ENCOUNTER — Telehealth: Payer: Self-pay | Admitting: Medical Oncology

## 2013-05-14 NOTE — Telephone Encounter (Signed)
I notified [facility] that Gilotrif on hold.

## 2013-05-15 ENCOUNTER — Encounter: Payer: Self-pay | Admitting: Internal Medicine

## 2013-05-15 ENCOUNTER — Telehealth: Payer: Self-pay | Admitting: Internal Medicine

## 2013-05-15 ENCOUNTER — Ambulatory Visit (HOSPITAL_BASED_OUTPATIENT_CLINIC_OR_DEPARTMENT_OTHER): Payer: Managed Care, Other (non HMO) | Admitting: Internal Medicine

## 2013-05-15 ENCOUNTER — Encounter: Payer: Self-pay | Admitting: *Deleted

## 2013-05-15 VITALS — BP 127/88 | HR 138 | Temp 97.7°F | Resp 20 | Ht 67.0 in | Wt 137.5 lb

## 2013-05-15 DIAGNOSIS — R21 Rash and other nonspecific skin eruption: Secondary | ICD-10-CM

## 2013-05-15 DIAGNOSIS — R599 Enlarged lymph nodes, unspecified: Secondary | ICD-10-CM

## 2013-05-15 DIAGNOSIS — R5383 Other fatigue: Secondary | ICD-10-CM

## 2013-05-15 DIAGNOSIS — R197 Diarrhea, unspecified: Secondary | ICD-10-CM

## 2013-05-15 DIAGNOSIS — C3430 Malignant neoplasm of lower lobe, unspecified bronchus or lung: Secondary | ICD-10-CM

## 2013-05-15 DIAGNOSIS — C7951 Secondary malignant neoplasm of bone: Secondary | ICD-10-CM

## 2013-05-15 DIAGNOSIS — C7952 Secondary malignant neoplasm of bone marrow: Secondary | ICD-10-CM

## 2013-05-15 DIAGNOSIS — C7931 Secondary malignant neoplasm of brain: Secondary | ICD-10-CM

## 2013-05-15 DIAGNOSIS — R0602 Shortness of breath: Secondary | ICD-10-CM

## 2013-05-15 MED ORDER — MORPHINE SULFATE ER 30 MG PO TBCR: EXTENDED_RELEASE_TABLET | ORAL | Status: DC

## 2013-05-15 NOTE — Patient Instructions (Signed)
CHEMOTHERAPY INTENT: Palliative.  CURRENT # OF CHEMOTHERAPY CYCLES: 3  CURRENT ANTIEMETICS: Compazine  CURRENT SMOKING STATUS: Never smoker  ORAL CHEMOTHERAPY AND CONSENT: Gilotrif, discontinued today  CURRENT BISPHOSPHONATES USE: Xgeva  PAIN MANAGEMENT: MS Contin and Vicodin  NARCOTICS INDUCED CONSTIPATION: Over the counter stool softener  LIVING WILL AND CODE STATUS: ?

## 2013-05-15 NOTE — Progress Notes (Signed)
05/15/2013 Patient in to clinic today for routine follow-up visit. She reports that she had labwork and CT scan done earlier this week. Upon arrival in exam room, after VS taken but prior to MD visit, the patient completed the Patient Reported Outcomes (PROs) independently, including the EQ-5D-3L followed by the Lung Cancer Symptom Scale (LCSS). Following completion, patient identifiers were added to the EQ-5D-3L questionnaire. Thanked patient for her participation, and indicated that next set of questionnaires will be completed in approximately one month. Cindy S. Clelia Croft BSN, RN, [facility] 05/15/2013 3:32 PM

## 2013-05-15 NOTE — Progress Notes (Signed)
[facility] Telephone:([phone]) [phone]   [phone]) [phone]  OFFICE PROGRESS NOTE  [name], MD [address] [facility], [place]. 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   PRIOR THERAPY:  1. Status post palliative radiotherapy to the right hilum as well as anterior inferior ribs and lumbar spine from L2-L5 under the care Dr. Shon Baton.  2. status post right hip prophylactic trochanteric femoral nail and under the care of Dr. Eulas Post  3. palliative radiotherapy to his lumbar spine under the care of Dr.Wentworth  4. Tarceva at 150 mg by mouth daily status post 37 months of treatment   CURRENT THERAPY:  1. Gilotrif 40 mg by mouth daily. Therapy started 12/17/2012, status post approximately 10 weeks of therapy, discontinued today secondary to disease progression  2. Xgeva 120 mg subcutaneously given on a monthly basis for bone metastasis status post 16 treatments   CHEMOTHERAPY INTENT: Palliative.  CURRENT # OF CHEMOTHERAPY CYCLES: 3  CURRENT ANTIEMETICS: Compazine  CURRENT SMOKING STATUS: Never smoker  ORAL CHEMOTHERAPY AND CONSENT: Gilotrif, discontinued today  CURRENT BISPHOSPHONATES USE: Xgeva  PAIN MANAGEMENT: MS Contin and Vicodin  NARCOTICS INDUCED CONSTIPATION: Over the counter stool softener  LIVING WILL AND CODE STATUS: ?  INTERVAL HISTORY: Makayla [name] 62 y.o. female returns to the clinic today for followup visit accompanied her son and sister. The patient continues to complain of increasing fatigue and weakness as well as significant areas of skin rash on the upper extremities as well as the abdominal and scalp. She is applying clindamycin lotion to this area with some improvement. She also continues to have few episodes of diarrhea and she uses  Imodium at regular basis. She denied having any significant chest pain but her shortness of breath was getting worse over the last few weeks with no cough or hemoptysis. Showed no significant weight loss or night sweats. The patient denied having any nausea or vomiting. She had repeat CT scan of the chest, abdomen pelvis as well as brain MRI performed recently and she is here for evaluation and discussion of her scan results.  MEDICAL HISTORY: Past Medical History  Diagnosis Date  . COPD (chronic obstructive pulmonary disease)   . Hx of radiation therapy   . Hx antineoplastic chemotherapy   . Shortness of breath   . GERD (gastroesophageal reflux disease)   . Lung cancer     lung ca dx 12/10  . Lung cancer 09/01/2011    bone mets    ALLERGIES:  is allergic to sulfonamide derivatives.  MEDICATIONS:  Current Outpatient Prescriptions  Medication Sig Dispense Refill  . afatinib dimaleate (GILOTRIF) 40 MG tablet Take 1 tablet (40 mg total) by mouth daily. Take on an empty stomach 1hr before or 2 hrs after meals.  30 tablet  0  . ALPRAZolam (XANAX) 0.5 MG tablet Take 0.5 mg by mouth at bedtime as needed. For sleep      . calcium-vitamin D (OSCAL WITH D) 500-200 MG-UNIT per tablet Take 1 tablet by mouth daily.        . clindamycin (CLEOCIN T) 1 % external solution Apply 1 application topically 2 (two) times daily as needed. Apply as needed to scalp      . denosumab (XGEVA) 120 MG/1.7ML SOLN Inject 120 mg into the skin  every 30 (thirty) days.       . diphenhydramine-acetaminophen (TYLENOL PM) 25-500 MG TABS Take 1 tablet by mouth at bedtime as needed.      . diphenoxylate-atropine (LOMOTIL) 2.5-0.025 MG per tablet Take 1 tablet by mouth 4 (four) times daily as needed for diarrhea or loose stools.  30 tablet  0  . doxycycline (VIBRA-TABS) 100 MG tablet Take 1 tablet (100 mg total) by mouth 2 (two) times daily. 100mg  BID x 10 days  20 tablet  0  . ferrous sulfate 325 (65 FE) MG tablet Take 325 mg  by mouth 2 (two) times daily.        . Hydrocodone-Acetaminophen 5-300 MG TABS TAKE 1 TABLET BY MOUTH EVERY 6 HOURS AS NEEDED FOR PAIN  60 each  0  . Magnesium 250 MG TABS Take 1 tablet by mouth daily.      . methylPREDNISolone (MEDROL DOSEPAK) 4 MG tablet follow package directions  21 tablet  0  . morphine (MS CONTIN) 30 MG 12 hr tablet Take 1 tablet by mouth every 8 hours  90 tablet  0  . [DISCONTINUED] warfarin (COUMADIN) 5 MG tablet Take 5 mg by mouth daily.         No current facility-administered medications for this visit.    SURGICAL HISTORY:  Past Surgical History  Procedure Laterality Date  . Abdominal hysterectomy    . Surgery right leg, rod and pin      REVIEW OF SYSTEMS:  A comprehensive review of systems was negative except for: Constitutional: positive for fatigue Respiratory: positive for dyspnea on exertion Gastrointestinal: positive for diarrhea Musculoskeletal: positive for muscle weakness   PHYSICAL EXAMINATION: General appearance: alert, cooperative, fatigued and no distress Head: Normocephalic, without obvious abnormality, atraumatic Neck: Palpable Lower left cervical lymph nodes Lymph nodes: Cervical, supraclavicular, and axillary nodes normal. and Palpable Lower left cervical lymph nodes Resp: diminished breath sounds RLL and RML and dullness to percussion RLL and RML Cardio: regular rate and rhythm, S1, S2 normal, no murmur, click, rub or gallop GI: soft, non-tender; bowel sounds normal; no masses,  no organomegaly Extremities: extremities normal, atraumatic, no cyanosis or edema Neurologic: Alert and oriented X 3, normal strength and tone. Normal symmetric reflexes. Normal coordination and gait  ECOG PERFORMANCE STATUS: 1 - Symptomatic but completely ambulatory  Blood pressure 127/88, pulse 138, temperature 97.7 F (36.5 C), temperature source Oral, resp. rate 20, height 5\' 7"  (1.702 m), weight 137 lb 8 oz (62.37 kg).  LABORATORY DATA: Lab Results    Component Value Date   WBC 7.9 05/13/2013   HGB 10.5* 05/13/2013   HCT 30.9* 05/13/2013   MCV 84.7 05/13/2013   PLT 342 05/13/2013      Chemistry      Component Value Date/Time   NA 134* 05/13/2013 1502   NA 136 05/09/2012 1321   NA 137 09/21/2011 0902   K 4.2 05/13/2013 1502   K 4.3 05/09/2012 1321   K 4.2 09/21/2011 0902   CL 102 03/13/2013 1023   CL 101 05/09/2012 1321   CL 101 09/21/2011 0902   CO2 25 05/13/2013 1502   CO2 24 05/09/2012 1321   CO2 28 09/21/2011 0902   BUN 10.4 05/13/2013 1502   BUN 12 05/09/2012 1321   BUN 16 09/21/2011 0902   CREATININE 0.7 05/13/2013 1502   CREATININE 0.72 05/09/2012 1321   CREATININE 1.0 09/21/2011 0902      Component Value Date/Time   CALCIUM 9.7 05/13/2013 1502  CALCIUM 9.0 05/09/2012 1321   CALCIUM 8.7 09/21/2011 0902   ALKPHOS 139 05/13/2013 1502   ALKPHOS 144* 05/09/2012 1321   ALKPHOS 130* 09/21/2011 0902   AST 15 05/13/2013 1502   AST 22 05/09/2012 1321   AST 27 09/21/2011 0902   ALT 10 05/13/2013 1502   ALT 16 05/09/2012 1321   ALT 26 09/21/2011 0902   BILITOT 0.20 05/13/2013 1502   BILITOT 0.5 05/09/2012 1321   BILITOT 0.60 09/21/2011 0902       RADIOGRAPHIC STUDIES: Ct Soft Tissue Neck W Contrast Ct Chest W Contrast  05/13/2013   *RADIOLOGY REPORT*  Clinical Data:  Lung cancer bone metastases.  Chemotherapy ongoing CT 02/08/2013  CT NECK, CHEST, ABDOMEN AND PELVIS WITH CONTRAST  Technique:  Multidetector CT imaging of the neck, chest, abdomen and pelvis was performed using the standard protocol following the bolus administration of intravenous contrast.  Contrast: OMNIPAQUE IOHEXOL 300 MG/ML  SOLN  Comparison:  02/08/2013    CT NECK  Findings:  Left level III necrotic lymph node measures 12 mm x 18 mm (image 56, series 6) compared to 15 mm x 13 mm on prior.  Small left level II lymph node measures 6 mm (image 45) compared to 6 mm on prior.  More inferiorly level IV lymph node measures 7 mm left adjacent to the thyroid gland (image 73)  increased from 6 mm on prior.  Limited view of the inferior brain is unremarkable.  Orbits are normal.  The mucosa is symmetric.  Salivary glands are normal.  IMPRESSION:  1.  Interval enlargement of the necrotic left level III lymph node. 2.  Mild enlargement of left level IV lymph node adjacent to the thyroid gland.    CT CHEST  Findings: No axillary lymphadenopathy.  No mediastinal lymphadenopathy.  There is postsurgical change of volume loss in the right hemithorax.  There is enhancing mass lesion within the atelectatic right lower lobe.  This lesion is difficult is to measure within the atelectatic lung but  appears to measures 7 x 5 cm in axial dimension (image 30) compared to 5.5 x 320 cm on prior remeasured. The hyperexpanded left lung demonstrates no nodularity.  IMPRESSION:  1.  Interval enlargement of the mass within the atelectatic right lower lobe. 2.  No mediastinal lymphadenopathy    CT ABDOMEN AND PELVIS  Findings: No focal hepatic lesion.  The gallbladder, pancreas, spleen, adrenal glands, and kidneys are normal.  The stomach, small bowel, and colon are normal.  Abdominal aorta normal caliber.  No retroperitoneal periportal lymphadenopathy.  There is no mesenteric or peritoneal disease.  No free fluid the pelvis.  The bladder is normal.  Post hysterectomy anatomy.  There is widespread sclerotic lesions within the pelvis, spine, and left humerus.  These findings are unchanged from comparison exam.  IMPRESSION:  1.  No evidence of metastasis in the soft tissue of the abdomen or pelvis. 2.  Extensive sclerotic skeletal metastasis in the axillary and appendicular skeleton unchanged from prior.   Original Report Authenticated By: Genevive Bi, M.D.   Mr Laqueta Jean ZO Contrast  05/13/2013   *RADIOLOGY REPORT*  Clinical Data: Lung cancer.  Headaches.  MRI HEAD WITHOUT AND WITH CONTRAST  Technique:  Multiplanar, multiecho pulse sequences of the brain and surrounding structures were obtained according  to standard protocol without and with intravenous contrast  Contrast: 13mL MULTIHANCE GADOBENATE DIMEGLUMINE 529 MG/ML IV SOLN  Comparison: MRI brain 10/07/2009  Findings: No acute stroke or hemorrhage.  No hydrocephalus.  Mild atrophy.  No significant subcortical/periventricular white matter signal abnormality.  Prominent perivascular spaces.  Flow voids maintained in the major intracranial vessels. Partial empty sella. Mild cervical spondylosis.  No definite osseous lesions of the calvarium, skull base, or upper cervical region.  Abnormal soft tissue in the left posterior frontal scalp (image 22 series 4, image 41 series 8 for instance) could represent inflammatory process or superficial metastasis. Correlate clinically.  Nonenhancing 5 mm lesion right mid pons (image 8 series 5) not present on previous MR could represent poorly vascularized metastatic focus.  Post infusion, there are multiple enhancing intracranial abnormalities not present previously consistent with metastatic disease.  These are listed from inferior to superior as follows  - Possible 3 mm lesion right cerebellum image 17 - Right lateral cerebellum, 3 mm, image 19.  Left posterior temporal lobe, 5 mm, image 26. - Right posterior temporal lobe near the Sylvian point, 17 x 22 mm, image 32. - Left putamen, 5 mm, image 35. - Left frontal lobe, 5 mm, image 43  Other lesions may be present and poorly visualized on this 1.5 T scan.  Consider MRI brain without with contrast using  3T imaging prior to definitive treatment planning.  Paranasal sinuses and mastoids clear.  Major dural venous sinuses widely patent.  Negative orbits.  IMPRESSION: Multiple new areas of abnormal intracranial enhancement consistent with metastatic disease as described.  Possible nonenhancing right pontine lesion.  Possible left scalp metastasis lesion without definite osseous disease.  These results will be called to the ordering clinician or representative by the Radiologist  Assistant, and communication documented in the PACS Dashboard.   Original Report Authenticated By: Davonna Belling, M.D.   ASSESSMENT AND PLAN: This is a very pleasant 62 years old white female with metastatic non-small cell lung cancer, adenocarcinoma diagnosed in December of 2010 with positive EGFR mutation in exon 21. She status post treatment with Tarceva for more than 3 years followed by 3 months treatment with Gilotrif but unfortunately the patient has significant evidence for disease progression on his recent scan with multiple brain metastases in addition to increase in the left cervical lymph nodes as well as the right lung mass and bone disease. I have a lengthy discussion with the patient and her family today about her current disease status and treatment options. I arranged for the patient to see Dr. Michell Heinrich tomorrow for evaluation and management of her brain metastasis. I also recommended for the patient to have ultrasound guided core biopsy of the left cervical lymph nodes for tissue diagnosis as well as molecular studies to rule out the presence of any resistant EGFR mutation. If the patient has the EGFR resistant mutation T790M, I may consider referring her for clinical trial with the [id number] which is currently available at the [facility] in [facility].  If there is no resistant mutation, I would consider the patient for systemic chemotherapy with carboplatin and Alimta after completion of her brain irradiation. The patient would come back for followup visit in 3 weeks for evaluation and discussion of her treatment options based on the biopsy results. For pain management, she was given a refill of MS Contin. She was advised to call immediately if she has any concerning symptoms in the interval. The patient voices understanding of current disease status and treatment options and is in agreement with the current care plan.  All questions were answered. The  patient knows to call the clinic with any problems, questions  or concerns. We can certainly see the patient much sooner if necessary.  I spent 20 minutes counseling the patient face to face. The total time spent in the appointment was 30 minutes.

## 2013-05-15 NOTE — Telephone Encounter (Signed)
gave pt appt for lab and Md on September 2014 °

## 2013-05-16 ENCOUNTER — Telehealth: Payer: Self-pay | Admitting: *Deleted

## 2013-05-16 ENCOUNTER — Encounter (HOSPITAL_COMMUNITY): Payer: Self-pay | Admitting: Pharmacy Technician

## 2013-05-16 ENCOUNTER — Other Ambulatory Visit: Payer: Self-pay | Admitting: Radiology

## 2013-05-16 ENCOUNTER — Encounter: Payer: Self-pay | Admitting: *Deleted

## 2013-05-16 MED ORDER — DEXAMETHASONE 4 MG PO TABS: 4.0000 mg | ORAL_TABLET | ORAL | Status: DC

## 2013-05-16 NOTE — Telephone Encounter (Signed)
Pt called stating she will not see Dr Michell Heinrich until 8/21 and wanted to make sure Dr Donnald Garre was okay with that.  He said that was okay but does want pt to start on decadron 4mg  every 6 hours.  Pt verbalized understanding.  SLJ

## 2013-05-16 NOTE — Progress Notes (Signed)
05/16/2013 Spoke with patient by phone today to confirm her smoking status. Patient began smoking at age 62 and stopped at age 42. She states that she only smoked about 1/2 pack per day during that time. Cindy S. Clelia Croft BSN, RN, CCRP 05/16/2013 11:14 AM

## 2013-05-17 ENCOUNTER — Ambulatory Visit (HOSPITAL_COMMUNITY)
Admission: RE | Admit: 2013-05-17 | Discharge: 2013-05-17 | Disposition: A | Discharge status: Home / Self Care | Payer: Managed Care, Other (non HMO) | Source: Ambulatory Visit | Attending: Internal Medicine | Admitting: Internal Medicine

## 2013-05-17 ENCOUNTER — Encounter (HOSPITAL_COMMUNITY): Payer: Self-pay

## 2013-05-17 DIAGNOSIS — Z9221 Personal history of antineoplastic chemotherapy: Secondary | ICD-10-CM | POA: Insufficient documentation

## 2013-05-17 DIAGNOSIS — J449 Chronic obstructive pulmonary disease, unspecified: Secondary | ICD-10-CM | POA: Insufficient documentation

## 2013-05-17 DIAGNOSIS — C7951 Secondary malignant neoplasm of bone: Secondary | ICD-10-CM | POA: Insufficient documentation

## 2013-05-17 DIAGNOSIS — C770 Secondary and unspecified malignant neoplasm of lymph nodes of head, face and neck: Secondary | ICD-10-CM | POA: Insufficient documentation

## 2013-05-17 DIAGNOSIS — C7952 Secondary malignant neoplasm of bone marrow: Secondary | ICD-10-CM | POA: Insufficient documentation

## 2013-05-17 DIAGNOSIS — C3490 Malignant neoplasm of unspecified part of unspecified bronchus or lung: Secondary | ICD-10-CM | POA: Insufficient documentation

## 2013-05-17 DIAGNOSIS — Z923 Personal history of irradiation: Secondary | ICD-10-CM | POA: Insufficient documentation

## 2013-05-17 DIAGNOSIS — J4489 Other specified chronic obstructive pulmonary disease: Secondary | ICD-10-CM | POA: Insufficient documentation

## 2013-05-17 HISTORY — DX: Dysphonia: R49.0

## 2013-05-17 MED ORDER — FENTANYL CITRATE 0.05 MG/ML IJ SOLN
INTRAMUSCULAR | Status: AC
  Filled 2013-05-17: qty 4

## 2013-05-17 MED ORDER — MIDAZOLAM HCL 2 MG/2ML IJ SOLN
INTRAMUSCULAR | Status: AC
  Filled 2013-05-17: qty 4

## 2013-05-17 MED ORDER — MIDAZOLAM HCL 2 MG/2ML IJ SOLN
INTRAMUSCULAR | Status: AC | PRN
  Administered 2013-05-17 (×2): 1 mg via INTRAVENOUS

## 2013-05-17 MED ORDER — SODIUM CHLORIDE 0.9 % IV SOLN
INTRAVENOUS | Status: DC
  Administered 2013-05-17: 11:00:00 via INTRAVENOUS

## 2013-05-17 MED ORDER — FENTANYL CITRATE 0.05 MG/ML IJ SOLN
INTRAMUSCULAR | Status: AC | PRN
  Administered 2013-05-17: 100 ug via INTRAVENOUS

## 2013-05-17 MED ORDER — HYDROCODONE-ACETAMINOPHEN 5-325 MG PO TABS
1.0000 | ORAL_TABLET | ORAL | Status: DC | PRN
  Filled 2013-05-17: qty 2

## 2013-05-17 NOTE — H&P (Signed)
Makayla [name] is an 62 y.o. female.   Chief Complaint: "I'm having a biopsy of a lymph node" HPI: Patient with history of metastatic NSC lung carcinoma and recent progression of disease (despite treatment) with enlarged left cervical lymph node. She presents today for US guided biopsy of the left cervical lymph node for pathology and molecular studies to r/o presence of any resistant EGFR mutation.  Past Medical History  Diagnosis Date  . COPD (chronic obstructive pulmonary disease)   . Hx of radiation therapy   . Hx antineoplastic chemotherapy   . Shortness of breath   . GERD (gastroesophageal reflux disease)   . Lung cancer     lung ca dx 12/10  . Lung cancer 09/01/2011    bone mets    Past Surgical History  Procedure Laterality Date  . Abdominal hysterectomy    . Surgery right leg, rod and pin      Family History  Problem Relation Age of Onset  . Cancer Neg Hx    Social History:  reports that she quit smoking about 12 years ago. Her smoking use included Cigarettes. She smoked 0.50 packs per day. She does not have any smokeless tobacco history on file. She reports that she does not drink alcohol or use illicit drugs.  Allergies:  Allergies  Allergen Reactions  . Sulfonamide Derivatives Nausea And Vomiting    Current outpatient prescriptions:ALPRAZolam (XANAX) 0.5 MG tablet, Take 1 mg by mouth at bedtime as needed for sleep. , Disp: , Rfl: ;  calcium-vitamin D (OSCAL WITH D) 500-200 MG-UNIT per tablet, Take 1 tablet by mouth daily.  , Disp: , Rfl: ;  clindamycin (CLEOCIN T) 1 % external solution, Apply 1 application topically 2 (two) times daily as needed (for scalp lesions). , Disp: , Rfl:  diphenhydramine-acetaminophen (TYLENOL PM) 25-500 MG TABS, Take 1 tablet by mouth at bedtime as needed (for sleep). , Disp: , Rfl: ;  ferrous sulfate 325 (65 FE) MG tablet, Take 325 mg by mouth 2 (two) times daily.  , Disp: , Rfl: ;  Hydrocodone-Acetaminophen 5-300 MG TABS, Take 1 tablet by  mouth every 6 (six) hours as needed (for pain)., Disp: , Rfl:  morphine (MS CONTIN) 30 MG 12 hr tablet, Take 30 mg by mouth every 8 (eight) hours., Disp: , Rfl: ;  neomycin-bacitracin-polymyxin (NEOSPORIN) [phone] ointment, Apply topically 4 (four) times daily. Pt states she puts it in nares sometimes, Disp: , Rfl: ;  denosumab (XGEVA) 120 MG/1.7ML SOLN, Inject 120 mg into the skin every 30 (thirty) days. , Disp: , Rfl:  dexamethasone (DECADRON) 4 MG tablet, Take 1 tablet (4 mg total) by mouth as directed. 4mg  every 6 hours until otherwise directed, Disp: 80 tablet, Rfl: 0;  diphenoxylate-atropine (LOMOTIL) 2.5-0.025 MG per tablet, Take 1 tablet by mouth 4 (four) times daily as needed for diarrhea or loose stools., Disp: 30 tablet, Rfl: 0 doxycycline (VIBRA-TABS) 100 MG tablet, Take 1 tablet (100 mg total) by mouth 2 (two) times daily. 100mg  BID x 10 days, Disp: 20 tablet, Rfl: 0;  [DISCONTINUED] warfarin (COUMADIN) 5 MG tablet, Take 5 mg by mouth daily.  , Disp: , Rfl:  Current facility-administered medications:0.9 %  sodium chloride infusion, , Intravenous, Continuous, D Jeananne Rama, PA-C, Last Rate: 20 mL/hr at 05/17/13 1109   Results for orders placed during the hospital encounter of 05/17/13 (from the past 48 hour(s))  APTT     Status: None   Collection Time    05/17/13 11:05 AM  Result Value Range   aPTT 33  24 - 37 seconds  PROTIME-INR     Status: None   Collection Time    05/17/13 11:05 AM      Result Value Range   Prothrombin Time 13.0  11.6 - 15.2 seconds   INR 1.00  0.00 - 1.49   No results found.  Review of Systems  Constitutional: Negative for fever and chills.  HENT: Positive for nosebleeds.        Hoarseness  Respiratory: Positive for shortness of breath. Negative for cough and hemoptysis.   Cardiovascular: Negative for chest pain.  Gastrointestinal: Negative for nausea, vomiting and abdominal pain.  Musculoskeletal: Positive for back pain.  Neurological: Positive  for headaches.    Blood pressure 106/62, pulse 113, temperature 97.8 F (36.6 C), temperature source Oral, resp. rate 20, height 5\' 4"  (1.626 m), weight 137 lb (62.143 kg), SpO2 100.00%. Physical Exam  Constitutional: She is oriented to person, place, and time. She appears well-developed and well-nourished.  Neck:  Palpable , tender left cervical LN  Cardiovascular:  Tachy but regular rhythm  Respiratory: Effort normal.  Dim BS RML/RLL, left clear  GI: Soft. Bowel sounds are normal. There is no tenderness.  Musculoskeletal: Normal range of motion. She exhibits no edema.  Neurological: She is alert and oriented to person, place, and time.     Assessment/Plan Pt with hx of metastatic NSC lung carcinoma with disease progression despite treatment and evidence of enlarged left cervical LN. Plan is for US guided biopsy of the left cervical LN today for pathology as well as molecular studies to r/o presence of resistant EGFR mutation. Details/risks of procedure d/w pt/son with their understanding and consent.  ALLRED,D KEVIN 05/17/2013, 11:29 AM

## 2013-05-17 NOTE — Procedures (Signed)
[place] core 18 x3 L cerv LAN No complication No blood loss. See complete dictation in [facility].

## 2013-05-21 ENCOUNTER — Other Ambulatory Visit: Payer: Self-pay | Admitting: Internal Medicine

## 2013-05-21 DIAGNOSIS — C3490 Malignant neoplasm of unspecified part of unspecified bronchus or lung: Secondary | ICD-10-CM

## 2013-05-21 NOTE — Progress Notes (Signed)
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  pecimen Gross and Clinical Informa  Tobacco/Marijuana/Snuff/ETOH use: former smoker 1/2 ppd  4 years,quit 2002,  Past/Anticipated interventions by cardiothoracic surgery, if any:no  Past/Anticipated interventions by medical oncology, if any:, lung ca dx 09/2009,  hx antineoplatic chemotherapy, Tarceva 150 mg daily s/p 37 months of treatment,  Followed by 3 month tx with Gliotrif (d/c 8/13) d/c multiple brain metastases in addition to left cervical lymph nodes,   Signs/Symptoms  Weight changes, if any: 5 lb loss past month, appetie better completion of chemoetherapy  Respiratory complaints, if any: sob  Hemoptysis, if any: no  Pain issues, if any:  Pain in left neck and back of neck, head aches,   SAFETY ISSUES:  Prior radiation? Yes, left humerus 08/27/12-09/11/12 30Gy at 3 Gy , alo palliative radiation to right hilum,rib,lumbar spine L2-L5 total dose 30Gy, completed 10/22/2009   Pacemaker/ICD?No  Possible current pregnancy? No\ Is the patient on methotrexate? No

## 2013-05-23 ENCOUNTER — Ambulatory Visit
Admission: RE | Admit: 2013-05-23 | Discharge: 2013-05-23 | Disposition: A | Discharge status: Home / Self Care | Payer: Managed Care, Other (non HMO) | Source: Ambulatory Visit | Attending: Radiation Oncology | Admitting: Radiation Oncology

## 2013-05-23 ENCOUNTER — Encounter: Payer: Self-pay | Admitting: Radiation Oncology

## 2013-05-23 VITALS — BP 140/97 | HR 135 | Temp 97.6°F | Resp 22 | Ht 64.0 in | Wt 136.6 lb

## 2013-05-23 DIAGNOSIS — C3490 Malignant neoplasm of unspecified part of unspecified bronchus or lung: Secondary | ICD-10-CM | POA: Insufficient documentation

## 2013-05-23 DIAGNOSIS — C7951 Secondary malignant neoplasm of bone: Secondary | ICD-10-CM

## 2013-05-23 DIAGNOSIS — C7931 Secondary malignant neoplasm of brain: Secondary | ICD-10-CM | POA: Insufficient documentation

## 2013-05-23 DIAGNOSIS — C719 Malignant neoplasm of brain, unspecified: Secondary | ICD-10-CM

## 2013-05-23 DIAGNOSIS — M542 Cervicalgia: Secondary | ICD-10-CM | POA: Insufficient documentation

## 2013-05-23 NOTE — Progress Notes (Signed)
Department of Radiation Oncology  Phone:  [phone] Fax:        [phone]   Name: CHRISTIONA SIDDIQUE MRN: [id number]  DOB: 08/29/1951  Date: 05/23/2013  Follow Up Visit Note  Diagnosis: Brain metastases from lung cancer  Summary and Interval since last radiation: Palliative radiation to the right hilum, rib, and lumbar spine to a total dose of 30 gray completed 10/22/2009 number next radiation to the left humerus completed 09/11/2012 2 total dose of 30 gray at 3 gray per fraction  Interval History: Aulani presents today for routine followup.  She has been on Gilotrif since March and underwent restaging studies on 05/13/2013. Progressive disease was noted including enlargement of the right lower lobe mass. Skeletal metastases were stable. She unfortunately was found to have multiple brain metastases. The largest was 1.7 x 2.2 cm in the right posterior temporal lobe. Other metastases which were subcentimeter were spread throughout the brain. A soft tissue lesion was noted in the left posterior frontal scalp. This is consistent with a skin infection which is a side effect of her chemotherapy. A CT of the neck was performed at the same time which did show an increase in size in the left level III lymph nodes. 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 She is being considered for trial enrollment. [name] asked me to see her for consideration of radiation in the management of her newly diagnosed brain metastases. She has been taking 4 mg of Decadron 3 times a day and has a headache since beginning her steroids. She reports no numbness or changes in her vision. Her headache is worse at the end of the day. She also reports neck pain which is worse with movement. She can move her head only a slight amount until she experiences excruciating pain. Her son says she basically sits in a chair with her head planning forward and is really  been limited in terms of her range of motion. There are a few abnormalities on her CT of the neck in the cervical and upper thoracic spine. These are not commented on the report. Unfortunately her MRI scan does not extend for and is down to visualize the cervical spine.  Allergies:  Allergies  Allergen Reactions  . Sulfonamide Derivatives Nausea And Vomiting    Medications:  Current Outpatient Prescriptions  Medication Sig Dispense Refill  . ALPRAZolam (XANAX) 0.5 MG tablet Take 1 mg by mouth at bedtime as needed for sleep.       . calcium-vitamin D (OSCAL WITH D) 500-200 MG-UNIT per tablet Take 1 tablet by mouth daily.        . clindamycin (CLEOCIN T) 1 % external solution Apply 1 application topically 2 (two) times daily as needed (for scalp lesions).       Marland Kitchen denosumab (XGEVA) 120 MG/1.7ML SOLN Inject 120 mg into the skin every 30 (thirty) days.       Marland Kitchen dexamethasone (DECADRON) 4 MG tablet Take 1 tablet (4 mg total) by mouth as directed. 4mg  every 6 hours until otherwise directed  80 tablet  0  . diphenhydramine-acetaminophen (TYLENOL PM) 25-500 MG TABS Take 1 tablet by mouth at bedtime as needed (for sleep).       . ferrous sulfate 325 (65 FE) MG tablet Take 325 mg by mouth 2 (two) times daily.        . Hydrocodone-Acetaminophen 5-300 MG TABS TAKE 1 TABLET BY MOUTH EVERY 6  HOURS AS NEEDED FOR PAIN  60 each  1  . Magnesium 250 MG TABS Take 1 tablet by mouth daily.      Marland Kitchen morphine (MS CONTIN) 30 MG 12 hr tablet Take 30 mg by mouth every 8 (eight) hours.      . diphenoxylate-atropine (LOMOTIL) 2.5-0.025 MG per tablet Take 1 tablet by mouth 4 (four) times daily as needed for diarrhea or loose stools.  30 tablet  0  . neomycin-bacitracin-polymyxin (NEOSPORIN) [phone] ointment Apply topically 4 (four) times daily. Pt states she puts it in nares sometimes      . [DISCONTINUED] warfarin (COUMADIN) 5 MG tablet Take 5 mg by mouth daily.         No current facility-administered medications for  this encounter.    Physical Exam:  Filed Vitals:   05/23/13 1056  BP: 140/97  Pulse: 135  Temp: 97.6 F (36.4 C)  Resp: 22   she is a pleasant female in no distress sitting comfortably examining table. She has 5 out of 5 grip and biceps strength. Her cranial nerves are intact. Her speech is normal. She is alert and oriented x3.  IMPRESSION: Perri is a 62 y.o. female with metastatic non-small cell lung cancer now with newly developed brain metastases and cervical neck pain  PLAN:  I would like to order an MRI of the neck to evaluate her neck pain. If we are going to treat her whole brain he would be much easier to treat her cervical spine at the same time rather than coming back and trying to match fields. She we were able to schedule an MRI of her neck on Friday night. I scheduled her for simulation Monday at 11. She was very fearful of the side effects of radiation and the effect it would have on her cognition. She feels it would be best if she went out on disability while she is undergoing treatment to the fatigue but can be associated with radiation therapy to the brain. I filled out the paperwork for her and we'll send him back in. We went over the possible side effects of treatment including but not limited to fatigue hair loss and memory loss. She has signed informed consent and agree to proceed forward.    Lurline Hare, MD

## 2013-05-23 NOTE — Progress Notes (Signed)
Please see the Nurse Progress Note in the MD Initial Consult Encounter for this patient. 

## 2013-05-24 ENCOUNTER — Telehealth: Payer: Self-pay | Admitting: *Deleted

## 2013-05-24 ENCOUNTER — Other Ambulatory Visit: Payer: Self-pay | Admitting: Radiation Oncology

## 2013-05-24 ENCOUNTER — Ambulatory Visit (HOSPITAL_COMMUNITY)
Admission: RE | Admit: 2013-05-24 | Discharge: 2013-05-24 | Disposition: A | Discharge status: Home / Self Care | Payer: Managed Care, Other (non HMO) | Source: Ambulatory Visit | Attending: Radiation Oncology | Admitting: Radiation Oncology

## 2013-05-24 DIAGNOSIS — C7951 Secondary malignant neoplasm of bone: Secondary | ICD-10-CM | POA: Insufficient documentation

## 2013-05-24 DIAGNOSIS — M542 Cervicalgia: Secondary | ICD-10-CM | POA: Insufficient documentation

## 2013-05-24 DIAGNOSIS — R599 Enlarged lymph nodes, unspecified: Secondary | ICD-10-CM | POA: Insufficient documentation

## 2013-05-24 DIAGNOSIS — C3490 Malignant neoplasm of unspecified part of unspecified bronchus or lung: Secondary | ICD-10-CM | POA: Insufficient documentation

## 2013-05-24 MED ORDER — GADOBENATE DIMEGLUMINE 529 MG/ML IV SOLN
12.0000 mL | Freq: Once | INTRAVENOUS | Status: AC | PRN
  Administered 2013-05-24: 12 mL via INTRAVENOUS

## 2013-05-24 MED ORDER — DOXYCYCLINE HYCLATE 100 MG PO TABS: 100.0000 mg | ORAL_TABLET | Freq: Two times a day (BID) | ORAL | Status: DC

## 2013-05-24 NOTE — Telephone Encounter (Signed)
Pt called stating that she has new abscesses that have formed on her bottom, her lip, and on her head.  No drainage except for a little from her lip.  Per dr Donnald Garre, use clindamycin lotion and ok to call in another course of doxycycline.  Pt verbalized understanding.

## 2013-05-27 ENCOUNTER — Ambulatory Visit
Admission: RE | Admit: 2013-05-27 | Discharge: 2013-05-27 | Disposition: A | Discharge status: Home / Self Care | Payer: Managed Care, Other (non HMO) | Source: Ambulatory Visit | Attending: Radiation Oncology | Admitting: Radiation Oncology

## 2013-05-27 DIAGNOSIS — C7951 Secondary malignant neoplasm of bone: Secondary | ICD-10-CM

## 2013-05-27 DIAGNOSIS — C801 Malignant (primary) neoplasm, unspecified: Secondary | ICD-10-CM | POA: Insufficient documentation

## 2013-05-27 DIAGNOSIS — C7931 Secondary malignant neoplasm of brain: Secondary | ICD-10-CM | POA: Insufficient documentation

## 2013-05-27 DIAGNOSIS — Z510 Encounter for antineoplastic radiation therapy: Secondary | ICD-10-CM | POA: Insufficient documentation

## 2013-05-27 NOTE — Progress Notes (Signed)
[facility]  Radiation Oncology   Simulation and Treatment Planning Note   Name: Makayla [name]    MRN: [id number]  Date: 05/27/2013    DOB: [birth year]  Status:  DIAGNOSIS: Brain Metastases  CONSENT VERIFIED:yes   SET UP: Patient is setup supine   IMMOBILIZATION: Following immobilization is used:Aquaplast Mask   NARRATIVE:The patient was brought to the CT Simulation planning suite. Identity was confirmed. All relevant records and images related to the planned course of therapy were reviewed. Then, the patient was positioned in a stable reproducible clinical set-up for radiation therapy. CT images were obtained. Marks were placed on the mask and then on the spine. The CT images were loaded into the planning software where the target (brain) and avoidance structures (globes) were contoured. The radiation prescription was entered and confirmed.   TREATMENT PLANNING NOTE:  Treatment planning then occurred.  I have requested : MLC's, isodose plan, basic dose calculation  I personally supervised and approved the construction of 5 medically necessary treatment devices.

## 2013-05-28 ENCOUNTER — Encounter: Payer: Self-pay | Admitting: Radiation Oncology

## 2013-05-28 ENCOUNTER — Ambulatory Visit
Admission: RE | Admit: 2013-05-28 | Discharge: 2013-05-28 | Disposition: A | Discharge status: Home / Self Care | Payer: Managed Care, Other (non HMO) | Source: Ambulatory Visit | Attending: Radiation Oncology | Admitting: Radiation Oncology

## 2013-05-28 VITALS — BP 124/86 | HR 115 | Temp 98.2°F | Ht 64.0 in | Wt 134.0 lb

## 2013-05-28 DIAGNOSIS — C7931 Secondary malignant neoplasm of brain: Secondary | ICD-10-CM

## 2013-05-28 DIAGNOSIS — C7951 Secondary malignant neoplasm of bone: Secondary | ICD-10-CM

## 2013-05-28 MED ORDER — BIAFINE EX EMUL
CUTANEOUS | Status: DC | PRN
  Administered 2013-05-28: 15:00:00 via TOPICAL

## 2013-05-28 NOTE — Progress Notes (Signed)
Weekly Management Note Current Dose: 2.5  Gy  Projected Dose: 30 Gy   Narrative:  The patient presents for routine under treatment assessment.  CBCT/MVCT images/Port film x-rays were reviewed.  The chart was checked. Doing well on lower does of steroids. No headaches. RN education performed.   Physical Findings: Weight:  . Unchanged. Walks gingerly.   Impression:  The patient is tolerating radiation.  Plan:  Continue treatment as planned. Start biafene to neck.

## 2013-05-28 NOTE — Progress Notes (Signed)
  Radiation Oncology         [phone] ________________________________  Name: Makayla [name] MRN: [id number]  Date: 05/28/2013  DOB: 1951/08/15  Simulation Verification Note  Status: outpatient  NARRATIVE: The patient was brought to the treatment unit and placed in the planned treatment position. The clinical setup was verified. Then port films were obtained and uploaded to the radiation oncology medical record software.  The treatment beams were carefully compared against the planned radiation fields. The position location and shape of the radiation fields was reviewed. The targeted volume of tissue appears appropriately covered by the radiation beams. Organs at risk appear to be excluded as planned.  Based on my personal review, I approved the simulation verification. The patient's treatment will proceed as planned.  ------------------------------------------------  Lurline Hare, MD

## 2013-05-28 NOTE — Addendum Note (Signed)
Encounter addended by: Lurline Hare, MD on: 05/28/2013  4:06 PM<BR>     Documentation filed: Notes Section

## 2013-05-28 NOTE — Progress Notes (Signed)
Makayla [name] has received 1 fractions to her C-spine and Her brain.  Education today regarding management of pain, skin on the neck and forehead and ears, fatigue , and reporting of any ataxia, headaches, decrease in fine motor movement and vision changes.  She stated understanding.  Given Radiation Therapy and You booklet with appriopriate pages marked.   Given Biafine.

## 2013-05-29 ENCOUNTER — Ambulatory Visit
Admission: RE | Admit: 2013-05-29 | Discharge: 2013-05-29 | Disposition: A | Discharge status: Home / Self Care | Payer: Managed Care, Other (non HMO) | Source: Ambulatory Visit | Attending: Radiation Oncology | Admitting: Radiation Oncology

## 2013-05-29 ENCOUNTER — Encounter: Payer: Self-pay | Admitting: Radiation Oncology

## 2013-05-29 NOTE — Progress Notes (Signed)
Letter dictated

## 2013-05-30 ENCOUNTER — Ambulatory Visit
Admission: RE | Admit: 2013-05-30 | Discharge: 2013-05-30 | Disposition: A | Discharge status: Home / Self Care | Payer: Managed Care, Other (non HMO) | Source: Ambulatory Visit | Attending: Radiation Oncology | Admitting: Radiation Oncology

## 2013-05-30 ENCOUNTER — Encounter: Payer: Self-pay | Admitting: Radiation Oncology

## 2013-05-30 NOTE — Progress Notes (Signed)
Faxed [employer] disability [phone]) paperwork today, included in this was the outline of [place] plan from Dr. Michell Heinrich. Copies scanned.

## 2013-05-31 ENCOUNTER — Ambulatory Visit
Admission: RE | Admit: 2013-05-31 | Discharge: 2013-05-31 | Disposition: A | Discharge status: Home / Self Care | Payer: Managed Care, Other (non HMO) | Source: Ambulatory Visit | Attending: Radiation Oncology | Admitting: Radiation Oncology

## 2013-06-04 ENCOUNTER — Ambulatory Visit
Admission: RE | Admit: 2013-06-04 | Discharge: 2013-06-04 | Disposition: A | Discharge status: Home / Self Care | Payer: Managed Care, Other (non HMO) | Source: Ambulatory Visit | Attending: Radiation Oncology | Admitting: Radiation Oncology

## 2013-06-04 ENCOUNTER — Encounter: Payer: Self-pay | Admitting: Radiation Oncology

## 2013-06-04 VITALS — BP 132/82 | HR 126 | Temp 97.5°F | Resp 20 | Wt 132.2 lb

## 2013-06-04 DIAGNOSIS — C7931 Secondary malignant neoplasm of brain: Secondary | ICD-10-CM

## 2013-06-04 NOTE — Progress Notes (Addendum)
weekly rad txs brain and c-4-c6, 5/12 compleetd, nexk slight tanning, not using biafine cream as yet, has h/a at night , pain 4-5 in left neck, took pain meds this am, stated a little off balance at times, i'm careful', has a cane walker, suggested to bring her cane, her son walks her down, will start biafine cream today, no c/o itching, can feel her forehead dry, appetite fair, taking decadron, 4mg  bid, completd antibiotics , has fever blister healing on bottom lip,drinks 1-2 ensure daily, always at breakfast .now  11:45 AM

## 2013-06-04 NOTE — Progress Notes (Signed)
Weekly Management Note Current Dose: 12.5  Gy  Projected Dose: 30 Gy   Narrative:  The patient presents for routine under treatment assessment.  CBCT/MVCT images/Port film x-rays were reviewed.  The chart was checked. Doing well. On decadron bid. No skin changes. Pain better this week than last. No headaches. Appetite good.   Physical Findings: Weight: 132 lb 3.2 oz (59.966 kg). Unchanged. No thrush.   Impression:  The patient is tolerating radiation.  Plan:  Continue treatment as planned. Continue decadron.

## 2013-06-05 ENCOUNTER — Encounter: Payer: Self-pay | Admitting: Internal Medicine

## 2013-06-05 ENCOUNTER — Encounter: Payer: Self-pay | Admitting: *Deleted

## 2013-06-05 ENCOUNTER — Ambulatory Visit (HOSPITAL_BASED_OUTPATIENT_CLINIC_OR_DEPARTMENT_OTHER): Payer: Managed Care, Other (non HMO) | Admitting: Internal Medicine

## 2013-06-05 ENCOUNTER — Ambulatory Visit
Admission: RE | Admit: 2013-06-05 | Discharge: 2013-06-05 | Disposition: A | Discharge status: Home / Self Care | Payer: Managed Care, Other (non HMO) | Source: Ambulatory Visit | Attending: Radiation Oncology | Admitting: Radiation Oncology

## 2013-06-05 ENCOUNTER — Other Ambulatory Visit (HOSPITAL_BASED_OUTPATIENT_CLINIC_OR_DEPARTMENT_OTHER): Payer: Managed Care, Other (non HMO) | Admitting: Lab

## 2013-06-05 ENCOUNTER — Other Ambulatory Visit: Payer: Self-pay | Admitting: Internal Medicine

## 2013-06-05 VITALS — BP 125/86 | HR 126 | Temp 98.4°F | Resp 21 | Ht 64.0 in | Wt 132.8 lb

## 2013-06-05 DIAGNOSIS — C7951 Secondary malignant neoplasm of bone: Secondary | ICD-10-CM

## 2013-06-05 DIAGNOSIS — C7931 Secondary malignant neoplasm of brain: Secondary | ICD-10-CM

## 2013-06-05 DIAGNOSIS — C3430 Malignant neoplasm of lower lobe, unspecified bronchus or lung: Secondary | ICD-10-CM

## 2013-06-05 LAB — COMPREHENSIVE METABOLIC PANEL (CC13)
ALT: 38 U/L (ref 0–55)
AST: 14 U/L (ref 5–34)
Calcium: 9 mg/dL (ref 8.4–10.4)
Chloride: 96 mEq/L — ABNORMAL LOW (ref 98–109)
Creatinine: 0.7 mg/dL (ref 0.6–1.1)
Sodium: 131 mEq/L — ABNORMAL LOW (ref 136–145)

## 2013-06-05 LAB — CBC WITH DIFFERENTIAL/PLATELET
BASO%: 0 % (ref 0.0–2.0)
EOS%: 0 % (ref 0.0–7.0)
HCT: 36.8 % (ref 34.8–46.6)
MCH: 28.4 pg (ref 25.1–34.0)
MCHC: 33.2 g/dL (ref 31.5–36.0)
NEUT%: 93.4 % — ABNORMAL HIGH (ref 38.4–76.8)
RBC: 4.29 10*6/uL (ref 3.70–5.45)
RDW: 15.6 % — ABNORMAL HIGH (ref 11.2–14.5)
lymph#: 0.4 10*3/uL — ABNORMAL LOW (ref 0.9–3.3)

## 2013-06-05 MED ORDER — MORPHINE SULFATE ER 30 MG PO TBCR: 30.0000 mg | EXTENDED_RELEASE_TABLET | Freq: Three times a day (TID) | ORAL | Status: DC

## 2013-06-05 NOTE — Telephone Encounter (Signed)
gv and printed appt sched and avs for pt for SEpt. °

## 2013-06-05 NOTE — Progress Notes (Signed)
[facility] [phone]   [phone]  OFFICE PROGRESS NOTE  [name], MD [address] [facility], [place]. 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   PRIOR THERAPY:  1. Status post palliative radiotherapy to the right hilum as well as anterior inferior ribs and lumbar spine from L2-L5 under the care Dr. Shon Baton.  2. status post right hip prophylactic trochanteric femoral nail and under the care of Dr. Eulas Post  3. palliative radiotherapy to his lumbar spine under the care of Dr.Wentworth  4. Tarceva at 150 mg by mouth daily status post 37 months of treatment. 5. Gilotrif 40 mg by mouth daily. Therapy started 12/17/2012, discontinued on 05/15/2013 secondary to disease progression   CURRENT THERAPY:  1. Whole brain irradiation under the care of Dr. Michell Heinrich.  2. Xgeva 120 mg subcutaneously given on a monthly basis for bone metastasis status post 16 treatments   CHEMOTHERAPY INTENT: Palliative.  CURRENT # OF CHEMOTHERAPY CYCLES: 0  CURRENT ANTIEMETICS: Compazine  CURRENT SMOKING STATUS: Never smoker  ORAL CHEMOTHERAPY AND CONSENT: None CURRENT BISPHOSPHONATES USE: Xgeva  PAIN MANAGEMENT: MS Contin and Vicodin  NARCOTICS INDUCED CONSTIPATION: Over the counter stool softener  LIVING WILL AND CODE STATUS: ?   INTERVAL HISTORY: Makayla [name] 62 y.o. female returns to the clinic today for followup visit accompanied by her sister and son. The patient is feeling a little bit better today. She denied having any significant chest pain but continues to have shortness breath with exertion. She is tolerating her whole brain irradiation fairly well. She continues to have pain in the neck area as well as lower back and requested refill of her MS Contin.  She denied having any fever or chills. The patient denied having any significant nausea or vomiting. She also had CT guided core biopsy of the left supraclavicular lymph nodes and the tissue blocks were sent for molecular studies by [facility]. This result is still pending.   MEDICAL HISTORY: Past Medical History  Diagnosis Date  . COPD (chronic obstructive pulmonary disease)   . Hx of radiation therapy   . Hx antineoplastic chemotherapy   . Shortness of breath   . GERD (gastroesophageal reflux disease)   . Lung cancer     lung ca dx 12/10  . Lung cancer 09/01/2011    bone mets  . 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  MEDICATIONS:  Current Outpatient Prescriptions  Medication Sig Dispense Refill  . ALPRAZolam (XANAX) 0.5 MG tablet Take 1 mg by mouth at bedtime as needed for sleep.       . calcium-vitamin D (OSCAL WITH D) 500-200 MG-UNIT per tablet Take 1 tablet by mouth daily.        . clindamycin (CLEOCIN T) 1 % external solution Apply 1 application topically 2 (two) times daily as needed (for scalp lesions).       Marland Kitchen denosumab (XGEVA) 120 MG/1.7ML SOLN Inject 120 mg into the skin every 30 (thirty) days.       Marland Kitchen dexamethasone (DECADRON) 4 MG tablet Take 4 mg by mouth 2 (two) times daily with a meal.      . diphenhydramine-acetaminophen (TYLENOL PM) 25-500  MG TABS Take 1 tablet by mouth at bedtime as needed (for sleep).       . diphenoxylate-atropine (LOMOTIL) 2.5-0.025 MG per tablet Take 1 tablet by mouth 4 (four) times daily as needed for diarrhea or loose stools.  30 tablet  0  . doxycycline (VIBRA-TABS) 100 MG tablet Take 1 tablet (100 mg total) by mouth 2 (two) times daily. 100mg  BID x 10 days  20 tablet  0  . emollient (BIAFINE) cream Apply 1 application topically 2 (two) times daily.      . ferrous sulfate 325 (65 FE) MG tablet Take 325 mg by mouth 2 (two) times daily.        .  Hydrocodone-Acetaminophen 5-300 MG TABS TAKE 1 TABLET BY MOUTH EVERY 6 HOURS AS NEEDED FOR PAIN  60 each  1  . Magnesium 250 MG TABS Take 1 tablet by mouth daily.      Marland Kitchen morphine (MS CONTIN) 30 MG 12 hr tablet Take 30 mg by mouth every 8 (eight) hours.      Marland Kitchen neomycin-bacitracin-polymyxin (NEOSPORIN) [phone] ointment Apply topically 4 (four) times daily. Pt states she puts it in nares sometimes      . [DISCONTINUED] warfarin (COUMADIN) 5 MG tablet Take 5 mg by mouth daily.         No current facility-administered medications for this visit.    SURGICAL HISTORY:  Past Surgical History  Procedure Laterality Date  . Abdominal hysterectomy    . Surgery right leg, rod and pin      REVIEW OF SYSTEMS:  A comprehensive review of systems was negative except for: Constitutional: positive for fatigue Respiratory: positive for dyspnea on exertion Musculoskeletal: positive for back pain and bone pain   PHYSICAL EXAMINATION: General appearance: alert, cooperative and no distress Head: Normocephalic, without obvious abnormality, atraumatic Neck: no adenopathy Lymph nodes: Cervical, supraclavicular, and axillary nodes normal. Resp: diminished breath sounds RLL, RML and RUL and dullness to percussion RLL, RML and RUL Cardio: regular rate and rhythm, S1, S2 normal, no murmur, click, rub or gallop GI: soft, non-tender; bowel sounds normal; no masses,  no organomegaly Extremities: extremities normal, atraumatic, no cyanosis or edema  ECOG PERFORMANCE STATUS: 1 - Symptomatic but completely ambulatory  Blood pressure 125/86, pulse 126, temperature 98.4 F (36.9 C), temperature source Oral, resp. rate 21, height 5\' 4"  (1.626 m), weight 132 lb 12.8 oz (60.238 kg), SpO2 100.00%.  LABORATORY DATA: Lab Results  Component Value Date   WBC 20.1* 06/05/2013   HGB 12.2 06/05/2013   HCT 36.8 06/05/2013   MCV 85.7 06/05/2013   PLT 272 06/05/2013      Chemistry      Component Value Date/Time   NA 134*  05/13/2013 1502   NA 136 05/09/2012 1321   NA 137 09/21/2011 0902   K 4.2 05/13/2013 1502   K 4.3 05/09/2012 1321   K 4.2 09/21/2011 0902   CL 102 03/13/2013 1023   CL 101 05/09/2012 1321   CL 101 09/21/2011 0902   CO2 25 05/13/2013 1502   CO2 24 05/09/2012 1321   CO2 28 09/21/2011 0902   BUN 10.4 05/13/2013 1502   BUN 12 05/09/2012 1321   BUN 16 09/21/2011 0902   CREATININE 0.7 05/13/2013 1502   CREATININE 0.72 05/09/2012 1321   CREATININE 1.0 09/21/2011 0902      Component Value Date/Time   CALCIUM 9.7 05/13/2013 1502   CALCIUM 9.0 05/09/2012 1321   CALCIUM 8.7 09/21/2011 0902   ALKPHOS  139 05/13/2013 1502   ALKPHOS 144* 05/09/2012 1321   ALKPHOS 130* 09/21/2011 0902   AST 15 05/13/2013 1502   AST 22 05/09/2012 1321   AST 27 09/21/2011 0902   ALT 10 05/13/2013 1502   ALT 16 05/09/2012 1321   ALT 26 09/21/2011 0902   BILITOT 0.20 05/13/2013 1502   BILITOT 0.5 05/09/2012 1321   BILITOT 0.60 09/21/2011 0902       RADIOGRAPHIC STUDIES: Ct Soft Tissue Neck W Contrast  05/13/2013   *RADIOLOGY REPORT*  Clinical Data:  Lung cancer bone metastases.  Chemotherapy ongoing CT 02/08/2013  CT NECK, CHEST, ABDOMEN AND PELVIS WITH CONTRAST  Technique:  Multidetector CT imaging of the neck, chest, abdomen and pelvis was performed using the standard protocol following the bolus administration of intravenous contrast.  Contrast: OMNIPAQUE IOHEXOL 300 MG/ML  SOLN  Comparison:  02/08/2013  CT NECK  Findings:  Left level III necrotic lymph node measures 12 mm x 18 mm (image 56, series 6) compared to 15 mm x 13 mm on prior.  Small left level II lymph node measures 6 mm (image 45) compared to 6 mm on prior.  More inferiorly level IV lymph node measures 7 mm left adjacent to the thyroid gland (image 73) increased from 6 mm on prior.  Limited view of the inferior brain is unremarkable.  Orbits are normal.  The mucosa is symmetric.  Salivary glands are normal.  IMPRESSION:  1.  Interval enlargement of the necrotic left level  III lymph node. 2.  Mild enlargement of left level IV lymph node adjacent to the thyroid gland.  CT CHEST  Findings: No axillary lymphadenopathy.  No mediastinal lymphadenopathy.  There is postsurgical change of volume loss in the right hemithorax.  There is enhancing mass lesion within the atelectatic right lower lobe.  This lesion is difficult is to measure within the atelectatic lung but  appears to measures 7 x 5 cm in axial dimension (image 30) compared to 5.5 x 320 cm on prior remeasured. The hyperexpanded left lung demonstrates no nodularity.  IMPRESSION:  1.  Interval enlargement of the mass within the atelectatic right lower lobe. 2.  No mediastinal lymphadenopathy  CT ABDOMEN AND PELVIS  Findings: No focal hepatic lesion.  The gallbladder, pancreas, spleen, adrenal glands, and kidneys are normal.  The stomach, small bowel, and colon are normal.  Abdominal aorta normal caliber.  No retroperitoneal periportal lymphadenopathy.  There is no mesenteric or peritoneal disease.  No free fluid the pelvis.  The bladder is normal.  Post hysterectomy anatomy.  There is widespread sclerotic lesions within the pelvis, spine, and left humerus.  These findings are unchanged from comparison exam.  IMPRESSION:  1.  No evidence of metastasis in the soft tissue of the abdomen or pelvis. 2.  Extensive sclerotic skeletal metastasis in the axillary and appendicular skeleton unchanged from prior.   Original Report Authenticated By: Genevive Bi, M.D.   Ct Chest W Contrast  05/13/2013   *RADIOLOGY REPORT*  Clinical Data:  Lung cancer bone metastases.  Chemotherapy ongoing CT 02/08/2013  CT NECK, CHEST, ABDOMEN AND PELVIS WITH CONTRAST  Technique:  Multidetector CT imaging of the neck, chest, abdomen and pelvis was performed using the standard protocol following the bolus administration of intravenous contrast.  Contrast: OMNIPAQUE IOHEXOL 300 MG/ML  SOLN  Comparison:  02/08/2013  CT NECK  Findings:  Left level III  necrotic lymph node measures 12 mm x 18 mm (image 56, series 6) compared  to 15 mm x 13 mm on prior.  Small left level II lymph node measures 6 mm (image 45) compared to 6 mm on prior.  More inferiorly level IV lymph node measures 7 mm left adjacent to the thyroid gland (image 73) increased from 6 mm on prior.  Limited view of the inferior brain is unremarkable.  Orbits are normal.  The mucosa is symmetric.  Salivary glands are normal.  IMPRESSION:  1.  Interval enlargement of the necrotic left level III lymph node. 2.  Mild enlargement of left level IV lymph node adjacent to the thyroid gland.  CT CHEST  Findings: No axillary lymphadenopathy.  No mediastinal lymphadenopathy.  There is postsurgical change of volume loss in the right hemithorax.  There is enhancing mass lesion within the atelectatic right lower lobe.  This lesion is difficult is to measure within the atelectatic lung but  appears to measures 7 x 5 cm in axial dimension (image 30) compared to 5.5 x 320 cm on prior remeasured. The hyperexpanded left lung demonstrates no nodularity.  IMPRESSION:  1.  Interval enlargement of the mass within the atelectatic right lower lobe. 2.  No mediastinal lymphadenopathy  CT ABDOMEN AND PELVIS  Findings: No focal hepatic lesion.  The gallbladder, pancreas, spleen, adrenal glands, and kidneys are normal.  The stomach, small bowel, and colon are normal.  Abdominal aorta normal caliber.  No retroperitoneal periportal lymphadenopathy.  There is no mesenteric or peritoneal disease.  No free fluid the pelvis.  The bladder is normal.  Post hysterectomy anatomy.  There is widespread sclerotic lesions within the pelvis, spine, and left humerus.  These findings are unchanged from comparison exam.  IMPRESSION:  1.  No evidence of metastasis in the soft tissue of the abdomen or pelvis. 2.  Extensive sclerotic skeletal metastasis in the axillary and appendicular skeleton unchanged from prior.   Original Report Authenticated By:  Genevive Bi, M.D.   Mr Laqueta Jean YN Contrast  05/13/2013   *RADIOLOGY REPORT*  Clinical Data: Lung cancer.  Headaches.  MRI HEAD WITHOUT AND WITH CONTRAST  Technique:  Multiplanar, multiecho pulse sequences of the brain and surrounding structures were obtained according to standard protocol without and with intravenous contrast  Contrast: 13mL MULTIHANCE GADOBENATE DIMEGLUMINE 529 MG/ML IV SOLN  Comparison: MRI brain 10/07/2009  Findings: No acute stroke or hemorrhage.  No hydrocephalus.  Mild atrophy.  No significant subcortical/periventricular white matter signal abnormality.  Prominent perivascular spaces.  Flow voids maintained in the major intracranial vessels. Partial empty sella. Mild cervical spondylosis.  No definite osseous lesions of the calvarium, skull base, or upper cervical region.  Abnormal soft tissue in the left posterior frontal scalp (image 22 series 4, image 41 series 8 for instance) could represent inflammatory process or superficial metastasis. Correlate clinically.  Nonenhancing 5 mm lesion right mid pons (image 8 series 5) not present on previous MR could represent poorly vascularized metastatic focus.  Post infusion, there are multiple enhancing intracranial abnormalities not present previously consistent with metastatic disease.  These are listed from inferior to superior as follows  - Possible 3 mm lesion right cerebellum image 17 - Right lateral cerebellum, 3 mm, image 19.  Left posterior temporal lobe, 5 mm, image 26. - Right posterior temporal lobe near the Sylvian point, 17 x 22 mm, image 32. - Left putamen, 5 mm, image 35. - Left frontal lobe, 5 mm, image 43  Other lesions may be present and poorly visualized on this 1.5 T scan.  Consider MRI brain without with contrast using  3T imaging prior to definitive treatment planning.  Paranasal sinuses and mastoids clear.  Major dural venous sinuses widely patent.  Negative orbits.  IMPRESSION: Multiple new areas of abnormal  intracranial enhancement consistent with metastatic disease as described.  Possible nonenhancing right pontine lesion.  Possible left scalp metastasis lesion without definite osseous disease.  These results will be called to the ordering clinician or representative by the Radiologist Assistant, and communication documented in the PACS Dashboard.   Original Report Authenticated By: Davonna Belling, M.D.   Mr Cervical Spine W Wo Contrast  05/25/2013   *RADIOLOGY REPORT*  Clinical Data: Neck pain.  Radiation to the left arm.  History of lung cancer.  MRI CERVICAL SPINE WITHOUT AND WITH CONTRAST  Technique:  Multiplanar and multiecho pulse sequences of the cervical spine, to include the craniocervical junction and cervicothoracic junction, were obtained according to standard protocol without and with intravenous contrast.  Contrast: 12mL MULTIHANCE GADOBENATE DIMEGLUMINE 529 MG/ML IV SOLN  Comparison: CT neck 05/13/2013.  Findings: Sclerotic lesions and C5 and T2 are unchanged from priors representing treated metastases.   There is no epidural extension. There is no cord compression.  Cervical lymphadenopathy is incompletely evaluated on this examination of, although a left level III lymph node appears increased from the August and measuring 14 x 22 mm and could cause left neck pain.  There is mild annular bulging and asymmetric uncinate spurring at C3 on the right which  could result in right C4 nerve root impingement.  There is advanced unilateral facet arthropathy at C5- 6 on the right which also narrows the foramen.  Posterior element disease at T2 on the left is incompletely evaluated but there is no definite neural impingement.  IMPRESSION: Sclerotic metastases at C5 and T2 appear similar to priors.  Enlarging left level III lymph node could be symptomatic.  No visible enhancing intraspinal tumor or significant left-sided spondylosis.  Posterior element disease at T2 on the left without definite neural impingement.    Original Report Authenticated By: Davonna Belling, M.D.   Ct Abdomen Pelvis W Contrast  05/13/2013   *RADIOLOGY REPORT*  Clinical Data:  Lung cancer bone metastases.  Chemotherapy ongoing CT 02/08/2013  CT NECK, CHEST, ABDOMEN AND PELVIS WITH CONTRAST  Technique:  Multidetector CT imaging of the neck, chest, abdomen and pelvis was performed using the standard protocol following the bolus administration of intravenous contrast.  Contrast: OMNIPAQUE IOHEXOL 300 MG/ML  SOLN  Comparison:  02/08/2013  CT NECK  Findings:  Left level III necrotic lymph node measures 12 mm x 18 mm (image 56, series 6) compared to 15 mm x 13 mm on prior.  Small left level II lymph node measures 6 mm (image 45) compared to 6 mm on prior.  More inferiorly level IV lymph node measures 7 mm left adjacent to the thyroid gland (image 73) increased from 6 mm on prior.  Limited view of the inferior brain is unremarkable.  Orbits are normal.  The mucosa is symmetric.  Salivary glands are normal.  IMPRESSION:  1.  Interval enlargement of the necrotic left level III lymph node. 2.  Mild enlargement of left level IV lymph node adjacent to the thyroid gland.  CT CHEST  Findings: No axillary lymphadenopathy.  No mediastinal lymphadenopathy.  There is postsurgical change of volume loss in the right hemithorax.  There is enhancing mass lesion within the atelectatic right lower lobe.  This lesion is difficult is to  measure within the atelectatic lung but  appears to measures 7 x 5 cm in axial dimension (image 30) compared to 5.5 x 320 cm on prior remeasured. The hyperexpanded left lung demonstrates no nodularity.  IMPRESSION:  1.  Interval enlargement of the mass within the atelectatic right lower lobe. 2.  No mediastinal lymphadenopathy  CT ABDOMEN AND PELVIS  Findings: No focal hepatic lesion.  The gallbladder, pancreas, spleen, adrenal glands, and kidneys are normal.  The stomach, small bowel, and colon are normal.  Abdominal aorta normal caliber.   No retroperitoneal periportal lymphadenopathy.  There is no mesenteric or peritoneal disease.  No free fluid the pelvis.  The bladder is normal.  Post hysterectomy anatomy.  There is widespread sclerotic lesions within the pelvis, spine, and left humerus.  These findings are unchanged from comparison exam.  IMPRESSION:  1.  No evidence of metastasis in the soft tissue of the abdomen or pelvis. 2.  Extensive sclerotic skeletal metastasis in the axillary and appendicular skeleton unchanged from prior.   Original Report Authenticated By: Genevive Bi, M.D.   [place] Core Biopsy  06/04/2013   *RADIOLOGY REPORT*  Clinical Data: Lung carcinoma with osseous metastases, progression of   cervical adenopathy.  ULTRASOUND CORE BIOPSY  Comparison: CT 05/13/2013  Technique and findings: The procedure, risks (including but not limited to bleeding, infection, organ damage), benefits, and alternatives were explained to the patient.  Questions regarding the procedure were encouraged and answered.  The patient understands and consents to the procedure.Survey ultrasound of the left neck was performed and   representative left cervical adenopathy localized.  An appropriate skin entry site was determined. Operator donned sterile gloves and mask.   Site was marked, prepped with Betadine, draped in usual sterile fashion, infiltrated locally with 1% lidocaine.  Intravenous Fentanyl and Versed were administered as conscious sedation during continuous cardiorespiratory monitoring by the radiology RN, with a total moderate sedation time of 15 minutes.  Under real time ultrasound guidance, a 17 gauge trocar needle was advanced to the margin of the lesion.  Once needle tip position was confirmed, coaxial 18-gauge core biopsy samples were obtained, submitted in formalin to  surgical pathology.  The guide needle was removed.  Postprocedure scans show no hematoma or other apparent complication. The patient tolerated the procedure well.   IMPRESSION: 1.  Technically successful ultrasound-guided core biopsy of left cervical adenopathy.   Original Report Authenticated By: D. Andria Rhein, MD    ASSESSMENT AND PLAN: This is a very pleasant 62 years old white female with metastatic non-small cell lung cancer, adenocarcinoma with positive EGFR mutation in exon 21 status post several years with oral EGFR Tyrosine Kinase inhibitor including Tarceva as well as Gilotrif, but unfortunately has evidence for disease progression recently. She is currently undergoing whole brain irradiation and still has 6 more fractions to complete this course. I recommended for the patient to complete her whole brain irradiation first. I am still awaiting the molecular studies for the new biopsy to see if the patient has any resistant mutation. If she does have the resistant mutation [id number], I would consider referring her for treatment with clinical trial with AZD [id number]. If the patient has no evidence for any resistant mutation, I would consider him for systemic chemotherapy with carboplatin and Alimta. I would see her back for followup visit in less than 2 weeks for reevaluation and more detailed discussion of these options. For pain management she was given a refill of MS Contin 30 mg by  mouth every 8 hours. She will also continue on Vicodin for breakthrough pain.  The patient voices understanding of current disease status and treatment options and is in agreement with the current care plan.  All questions were answered. The patient knows to call the clinic with any problems, questions or concerns. We can certainly see the patient much sooner if necessary.  I spent 15 minutes counseling the patient face to face. The total time spent in the appointment was 25 minutes.

## 2013-06-05 NOTE — Progress Notes (Signed)
06/05/2013 Patient in to clinic today for routine scheduled follow-up visit, on observation while undergoing radiation therapy. Upon arrival to the clinic, the patient completed the Patient Reported Outcomes (PROs) independently, including the EQ-5D-3L followed by the Lung Cancer Symptom Scale (LCSS), prior to her lab and MD appointments. Following completion, patient identifiers were added to the EQ-5D-3L questionnaire. Explained to patient that the next visit for completion of PROs will be determined based on the patient's upcoming treatment plans, schedule and the protocol requirements (approximately monthly +/- 2 weeks). Thanked patient for her participation in the study. Cindy S. Clelia Croft BSN, RN, CCRP 06/05/2013 4:40 PM

## 2013-06-06 ENCOUNTER — Other Ambulatory Visit: Payer: Self-pay | Admitting: Medical Oncology

## 2013-06-06 ENCOUNTER — Ambulatory Visit
Admission: RE | Admit: 2013-06-06 | Discharge: 2013-06-06 | Disposition: A | Discharge status: Home / Self Care | Payer: Managed Care, Other (non HMO) | Source: Ambulatory Visit | Attending: Radiation Oncology | Admitting: Radiation Oncology

## 2013-06-06 NOTE — Telephone Encounter (Signed)
Called in hydrocodone refill

## 2013-06-07 ENCOUNTER — Encounter: Payer: Self-pay | Admitting: *Deleted

## 2013-06-07 ENCOUNTER — Ambulatory Visit
Admission: RE | Admit: 2013-06-07 | Discharge: 2013-06-07 | Disposition: A | Discharge status: Home / Self Care | Payer: Managed Care, Other (non HMO) | Source: Ambulatory Visit | Attending: Radiation Oncology | Admitting: Radiation Oncology

## 2013-06-07 ENCOUNTER — Other Ambulatory Visit: Payer: Self-pay | Admitting: Medical Oncology

## 2013-06-07 NOTE — Telephone Encounter (Signed)
I called in refill to phamacy and pt notified.

## 2013-06-07 NOTE — Progress Notes (Signed)
Faxed received from [facility].  [name] aware of results

## 2013-06-10 ENCOUNTER — Telehealth: Payer: Self-pay | Admitting: *Deleted

## 2013-06-10 ENCOUNTER — Ambulatory Visit
Admission: RE | Admit: 2013-06-10 | Discharge: 2013-06-10 | Disposition: A | Discharge status: Home / Self Care | Payer: Managed Care, Other (non HMO) | Source: Ambulatory Visit | Attending: Radiation Oncology | Admitting: Radiation Oncology

## 2013-06-10 ENCOUNTER — Ambulatory Visit: Payer: Managed Care, Other (non HMO)

## 2013-06-10 NOTE — Telephone Encounter (Signed)
Spoke with Ms. Makayla [name].  I informed her to come see [name] when finished with radiation appt.  She verbalized understanding

## 2013-06-11 ENCOUNTER — Ambulatory Visit
Admission: RE | Admit: 2013-06-11 | Discharge: 2013-06-11 | Disposition: A | Discharge status: Home / Self Care | Payer: Managed Care, Other (non HMO) | Source: Ambulatory Visit | Attending: Radiation Oncology | Admitting: Radiation Oncology

## 2013-06-11 ENCOUNTER — Encounter: Payer: Self-pay | Admitting: Radiation Oncology

## 2013-06-11 VITALS — BP 100/60 | HR 86 | Temp 97.6°F | Resp 20 | Wt 129.1 lb

## 2013-06-11 DIAGNOSIS — C7931 Secondary malignant neoplasm of brain: Secondary | ICD-10-CM

## 2013-06-11 MED ORDER — SUCRALFATE 1 GM/10ML PO SUSP: 1.0000 g | Freq: Four times a day (QID) | ORAL | Status: DC

## 2013-06-11 MED ORDER — MAGIC MOUTHWASH W/LIDOCAINE: 15.0000 mL | Freq: Three times a day (TID) | ORAL | Status: DC | PRN

## 2013-06-11 NOTE — Progress Notes (Signed)
weekly rad txs, 10 brain/c-spine,  No c/o pain, appetite good, on decadron, but tongue is coated, had diarrhea Thursday night and Friday night, took 2 lomotil, didn't help so took rx med from her Dr. Janese Banks in July, thinks it starts with  D,, patient whispering, says throat  Hurts,tight,  needs rx, no diff swallowing, wakes up with thick phelgm,  No head aches, or  blurred vision, fatigue level low 2:18 PM

## 2013-06-11 NOTE — Progress Notes (Signed)
Weekly Management Note Current Dose: 25  Gy  Projected Dose: 30 Gy   Narrative:  The patient presents for routine under treatment assessment.  CBCT/MVCT images/Port film x-rays were reviewed.  The chart was checked. Some dysphagia. Requests meds for this. No taste. Minimal headaches. Meeting with [facility] for results of biopsy and to discuss clinical trials.Hair falling out.   Physical Findings: Weight: 129 lb 1.6 oz (58.559 kg). No thrush.   Impression:  The patient is tolerating radiation.  Plan:  Continue treatment as planned. Decrease decadron to 4 mg once a day. If any headaches increase back to bid.

## 2013-06-12 ENCOUNTER — Emergency Department (HOSPITAL_COMMUNITY): Payer: Managed Care, Other (non HMO)

## 2013-06-12 ENCOUNTER — Inpatient Hospital Stay (HOSPITAL_COMMUNITY)
Admission: EM | Admit: 2013-06-12 | Discharge: 2013-06-28 | DRG: 189 | Disposition: A | Discharge status: Hospice | Payer: Managed Care, Other (non HMO) | Attending: Internal Medicine | Admitting: Internal Medicine

## 2013-06-12 ENCOUNTER — Encounter (HOSPITAL_COMMUNITY): Payer: Self-pay | Admitting: *Deleted

## 2013-06-12 ENCOUNTER — Ambulatory Visit: Payer: Managed Care, Other (non HMO)

## 2013-06-12 ENCOUNTER — Encounter: Payer: Self-pay | Admitting: *Deleted

## 2013-06-12 DIAGNOSIS — K219 Gastro-esophageal reflux disease without esophagitis: Secondary | ICD-10-CM | POA: Diagnosis present

## 2013-06-12 DIAGNOSIS — C7951 Secondary malignant neoplasm of bone: Secondary | ICD-10-CM | POA: Diagnosis present

## 2013-06-12 DIAGNOSIS — J690 Pneumonitis due to inhalation of food and vomit: Secondary | ICD-10-CM | POA: Diagnosis not present

## 2013-06-12 DIAGNOSIS — R4702 Dysphasia: Secondary | ICD-10-CM | POA: Diagnosis not present

## 2013-06-12 DIAGNOSIS — R339 Retention of urine, unspecified: Secondary | ICD-10-CM | POA: Diagnosis not present

## 2013-06-12 DIAGNOSIS — T451X5A Adverse effect of antineoplastic and immunosuppressive drugs, initial encounter: Secondary | ICD-10-CM | POA: Diagnosis present

## 2013-06-12 DIAGNOSIS — A419 Sepsis, unspecified organism: Secondary | ICD-10-CM | POA: Diagnosis not present

## 2013-06-12 DIAGNOSIS — G893 Neoplasm related pain (acute) (chronic): Secondary | ICD-10-CM | POA: Diagnosis present

## 2013-06-12 DIAGNOSIS — D696 Thrombocytopenia, unspecified: Secondary | ICD-10-CM | POA: Diagnosis present

## 2013-06-12 DIAGNOSIS — R627 Adult failure to thrive: Secondary | ICD-10-CM | POA: Diagnosis present

## 2013-06-12 DIAGNOSIS — J449 Chronic obstructive pulmonary disease, unspecified: Secondary | ICD-10-CM

## 2013-06-12 DIAGNOSIS — Z515 Encounter for palliative care: Secondary | ICD-10-CM

## 2013-06-12 DIAGNOSIS — E876 Hypokalemia: Secondary | ICD-10-CM | POA: Diagnosis present

## 2013-06-12 DIAGNOSIS — R1310 Dysphagia, unspecified: Secondary | ICD-10-CM | POA: Diagnosis present

## 2013-06-12 DIAGNOSIS — D630 Anemia in neoplastic disease: Secondary | ICD-10-CM | POA: Diagnosis present

## 2013-06-12 DIAGNOSIS — Z87891 Personal history of nicotine dependence: Secondary | ICD-10-CM

## 2013-06-12 DIAGNOSIS — C3490 Malignant neoplasm of unspecified part of unspecified bronchus or lung: Secondary | ICD-10-CM | POA: Diagnosis present

## 2013-06-12 DIAGNOSIS — E236 Other disorders of pituitary gland: Secondary | ICD-10-CM | POA: Diagnosis present

## 2013-06-12 DIAGNOSIS — R531 Weakness: Secondary | ICD-10-CM

## 2013-06-12 DIAGNOSIS — Z923 Personal history of irradiation: Secondary | ICD-10-CM

## 2013-06-12 DIAGNOSIS — J9601 Acute respiratory failure with hypoxia: Secondary | ICD-10-CM

## 2013-06-12 DIAGNOSIS — D638 Anemia in other chronic diseases classified elsewhere: Secondary | ICD-10-CM | POA: Diagnosis present

## 2013-06-12 DIAGNOSIS — J189 Pneumonia, unspecified organism: Secondary | ICD-10-CM

## 2013-06-12 DIAGNOSIS — J4489 Other specified chronic obstructive pulmonary disease: Secondary | ICD-10-CM | POA: Diagnosis present

## 2013-06-12 DIAGNOSIS — D6481 Anemia due to antineoplastic chemotherapy: Secondary | ICD-10-CM | POA: Diagnosis present

## 2013-06-12 DIAGNOSIS — L02821 Furuncle of head [any part, except face]: Secondary | ICD-10-CM | POA: Diagnosis present

## 2013-06-12 DIAGNOSIS — E870 Hyperosmolality and hypernatremia: Secondary | ICD-10-CM | POA: Diagnosis not present

## 2013-06-12 DIAGNOSIS — Z66 Do not resuscitate: Secondary | ICD-10-CM | POA: Diagnosis present

## 2013-06-12 DIAGNOSIS — J9600 Acute respiratory failure, unspecified whether with hypoxia or hypercapnia: Principal | ICD-10-CM | POA: Diagnosis present

## 2013-06-12 DIAGNOSIS — C7931 Secondary malignant neoplasm of brain: Secondary | ICD-10-CM | POA: Diagnosis present

## 2013-06-12 DIAGNOSIS — R0602 Shortness of breath: Secondary | ICD-10-CM

## 2013-06-12 DIAGNOSIS — L02828 Furuncle of other sites: Secondary | ICD-10-CM | POA: Diagnosis present

## 2013-06-12 DIAGNOSIS — N179 Acute kidney failure, unspecified: Secondary | ICD-10-CM | POA: Diagnosis not present

## 2013-06-12 DIAGNOSIS — A4902 Methicillin resistant Staphylococcus aureus infection, unspecified site: Secondary | ICD-10-CM | POA: Diagnosis present

## 2013-06-12 DIAGNOSIS — E43 Unspecified severe protein-calorie malnutrition: Secondary | ICD-10-CM | POA: Diagnosis present

## 2013-06-12 DIAGNOSIS — R5381 Other malaise: Secondary | ICD-10-CM | POA: Diagnosis present

## 2013-06-12 DIAGNOSIS — I4891 Unspecified atrial fibrillation: Secondary | ICD-10-CM | POA: Diagnosis present

## 2013-06-12 HISTORY — DX: Pneumonia, unspecified organism: J18.9

## 2013-06-12 HISTORY — DX: Retention of urine, unspecified: R33.9

## 2013-06-12 LAB — CBC WITH DIFFERENTIAL/PLATELET
Basophils Absolute: 0 10*3/uL (ref 0.0–0.1)
Basophils Relative: 0 % (ref 0–1)
Eosinophils Relative: 0 % (ref 0–5)
HCT: 37.2 % (ref 36.0–46.0)
MCHC: 35.2 g/dL (ref 30.0–36.0)
MCV: 84.2 fL (ref 78.0–100.0)
Monocytes Absolute: 0.5 10*3/uL (ref 0.1–1.0)
Neutro Abs: 12.8 10*3/uL — ABNORMAL HIGH (ref 1.7–7.7)
RDW: 14.8 % (ref 11.5–15.5)

## 2013-06-12 LAB — URINALYSIS, ROUTINE W REFLEX MICROSCOPIC
Glucose, UA: NEGATIVE mg/dL
Specific Gravity, Urine: 1.029 (ref 1.005–1.030)
Urobilinogen, UA: 0.2 mg/dL (ref 0.0–1.0)

## 2013-06-12 LAB — COMPREHENSIVE METABOLIC PANEL
AST: 12 U/L (ref 0–37)
Albumin: 2.9 g/dL — ABNORMAL LOW (ref 3.5–5.2)
Calcium: 8.8 mg/dL (ref 8.4–10.5)
Creatinine, Ser: 0.57 mg/dL (ref 0.50–1.10)

## 2013-06-12 LAB — URINE MICROSCOPIC-ADD ON

## 2013-06-12 LAB — POCT I-STAT TROPONIN I: Troponin i, poc: 0.06 ng/mL (ref 0.00–0.08)

## 2013-06-12 MED ORDER — PIPERACILLIN-TAZOBACTAM 3.375 G IVPB 30 MIN
3.3750 g | Freq: Once | INTRAVENOUS | Status: AC
  Administered 2013-06-13: 3.375 g via INTRAVENOUS
  Filled 2013-06-12: qty 50

## 2013-06-12 MED ORDER — MORPHINE SULFATE ER 30 MG PO TBCR
30.0000 mg | EXTENDED_RELEASE_TABLET | Freq: Once | ORAL | Status: AC
  Administered 2013-06-13: 30 mg via ORAL
  Filled 2013-06-12: qty 1

## 2013-06-12 MED ORDER — FUROSEMIDE 10 MG/ML IJ SOLN
40.0000 mg | Freq: Once | INTRAMUSCULAR | Status: AC
  Administered 2013-06-12: 40 mg via INTRAVENOUS
  Filled 2013-06-12: qty 4

## 2013-06-12 MED ORDER — IOHEXOL 350 MG/ML SOLN
100.0000 mL | Freq: Once | INTRAVENOUS | Status: AC | PRN
  Administered 2013-06-12: 100 mL via INTRAVENOUS

## 2013-06-12 MED ORDER — ONDANSETRON HCL 4 MG/2ML IJ SOLN: 4.0000 mg | Freq: Four times a day (QID) | INTRAMUSCULAR | Status: DC | PRN

## 2013-06-12 MED ORDER — MORPHINE SULFATE ER 30 MG PO TBCR
30.0000 mg | EXTENDED_RELEASE_TABLET | Freq: Three times a day (TID) | ORAL | Status: DC
  Administered 2013-06-13 – 2013-06-19 (×17): 30 mg via ORAL
  Filled 2013-06-12 (×21): qty 1

## 2013-06-12 MED ORDER — DILTIAZEM LOAD VIA INFUSION
20.0000 mg | Freq: Once | INTRAVENOUS | Status: AC
  Administered 2013-06-12: 20 mg via INTRAVENOUS
  Filled 2013-06-12: qty 20

## 2013-06-12 MED ORDER — SODIUM CHLORIDE 0.9 % IJ SOLN: 3.0000 mL | INTRAMUSCULAR | Status: DC | PRN

## 2013-06-12 MED ORDER — VANCOMYCIN HCL IN DEXTROSE 750-5 MG/150ML-% IV SOLN
750.0000 mg | Freq: Two times a day (BID) | INTRAVENOUS | Status: DC
  Administered 2013-06-13 – 2013-06-15 (×5): 750 mg via INTRAVENOUS
  Filled 2013-06-12 (×5): qty 150

## 2013-06-12 MED ORDER — NITROGLYCERIN IN D5W 200-5 MCG/ML-% IV SOLN
5.0000 ug/min | INTRAVENOUS | Status: DC
  Filled 2013-06-12: qty 250

## 2013-06-12 MED ORDER — VANCOMYCIN HCL IN DEXTROSE 1-5 GM/200ML-% IV SOLN
1000.0000 mg | Freq: Once | INTRAVENOUS | Status: AC
  Administered 2013-06-12: 1000 mg via INTRAVENOUS
  Filled 2013-06-12: qty 200

## 2013-06-12 MED ORDER — SODIUM CHLORIDE 0.9 % IV SOLN: 250.0000 mL | INTRAVENOUS | Status: DC | PRN

## 2013-06-12 MED ORDER — ALBUTEROL SULFATE (5 MG/ML) 0.5% IN NEBU
5.0000 mg | INHALATION_SOLUTION | Freq: Once | RESPIRATORY_TRACT | Status: AC
  Administered 2013-06-12: 5 mg via RESPIRATORY_TRACT
  Filled 2013-06-12: qty 1

## 2013-06-12 MED ORDER — ONDANSETRON HCL 4 MG PO TABS: 4.0000 mg | ORAL_TABLET | Freq: Four times a day (QID) | ORAL | Status: DC | PRN

## 2013-06-12 MED ORDER — SODIUM CHLORIDE 0.9 % IJ SOLN
3.0000 mL | Freq: Two times a day (BID) | INTRAMUSCULAR | Status: DC
  Administered 2013-06-13: 11:00:00 via INTRAVENOUS
  Administered 2013-06-14 – 2013-06-27 (×11): 3 mL via INTRAVENOUS

## 2013-06-12 MED ORDER — ALPRAZOLAM 1 MG PO TABS
1.0000 mg | ORAL_TABLET | Freq: Every evening | ORAL | Status: DC | PRN
  Administered 2013-06-19: 1 mg via ORAL
  Filled 2013-06-12 (×2): qty 1

## 2013-06-12 MED ORDER — DILTIAZEM HCL 100 MG IV SOLR
5.0000 mg/h | INTRAVENOUS | Status: DC
  Administered 2013-06-12: 5 mg/h via INTRAVENOUS
  Administered 2013-06-12: 10 mg/h via INTRAVENOUS
  Administered 2013-06-12: 15 mg/h via INTRAVENOUS
  Administered 2013-06-12: 10 mg/h via INTRAVENOUS

## 2013-06-12 MED ORDER — HYDROMORPHONE HCL PF 2 MG/ML IJ SOLN
2.0000 mg | INTRAMUSCULAR | Status: DC | PRN
  Administered 2013-06-15 – 2013-06-26 (×30): 2 mg via INTRAVENOUS
  Filled 2013-06-12 (×32): qty 1

## 2013-06-12 MED ORDER — SODIUM CHLORIDE 0.9 % IV BOLUS (SEPSIS)
500.0000 mL | Freq: Once | INTRAVENOUS | Status: AC
  Administered 2013-06-13: 500 mL via INTRAVENOUS

## 2013-06-12 MED ORDER — DEXAMETHASONE 4 MG PO TABS
4.0000 mg | ORAL_TABLET | Freq: Every day | ORAL | Status: DC
  Administered 2013-06-13: 4 mg via ORAL
  Filled 2013-06-12: qty 1

## 2013-06-12 MED ORDER — PIPERACILLIN-TAZOBACTAM 3.375 G IVPB
3.3750 g | Freq: Three times a day (TID) | INTRAVENOUS | Status: DC
  Administered 2013-06-13 – 2013-06-16 (×10): 3.375 g via INTRAVENOUS
  Filled 2013-06-12 (×12): qty 50

## 2013-06-12 MED ORDER — SODIUM CHLORIDE 0.9 % IV BOLUS (SEPSIS)
500.0000 mL | Freq: Once | INTRAVENOUS | Status: AC
  Administered 2013-06-12: 500 mL via INTRAVENOUS

## 2013-06-12 NOTE — H&P (Signed)
PCP:   [name], MD   Chief Complaint:  sob  HPI: 62 yo female with stage 4 lung cancer comes in with sob, worsening resp distress, cough.  Pt is currently on bipap and cannot answer questions due to her sob.  She knows what year it is.  Both of her sons are present.  She has had extension of her cancer despite aggressive chemo/radiation and is actually undergoing evaluation to possibly start experimental tx in [place].  Denies fevers.  No n/v/d.  No pain right now.  She has no complaints but sob.  She feels some better since starting on bipap.  Her son found her down on the floor in her home, she has been extremely weak.  Review of Systems:  Unobtainable due to pt resp status  Past Medical History: Past Medical History  Diagnosis Date  . COPD (chronic obstructive pulmonary disease)   . Hx of radiation therapy   . Hx antineoplastic chemotherapy   . Shortness of breath   . GERD (gastroesophageal reflux disease)   . Lung cancer     lung ca dx 12/10  . Lung cancer 09/01/2011    bone mets  . 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 Abdominal hysterectomy    . Surgery right leg, rod and pin      Medications: Prior to Admission medications   Medication Sig Start Date End Date Taking? Authorizing Provider  ALPRAZolam Prudy Feeler) 0.5 MG tablet Take 1 mg by mouth at bedtime as needed for sleep.    Yes Historical Provider, MD  calcium-vitamin D (OSCAL WITH D) 500-200 MG-UNIT per tablet Take 1 tablet by mouth daily.     Yes Historical Provider, MD  clindamycin (CLEOCIN T) 1 % external solution Apply 1 application topically 2 (two) times daily as needed (for scalp lesions).    Yes Historical Provider, MD  dexamethasone (DECADRON) 4 MG tablet Take 4 mg by mouth daily.  05/16/13  Yes [name], MD  Docosanol (ABREVA) 10 % CREA Apply 1 application topically as needed. Blister/scab on bottom lip    Yes Historical Provider, MD  ferrous sulfate 325 (65 FE) MG tablet Take 325 mg by mouth 2 (two) times daily.     Yes Historical Provider, MD  Hydrocodone-Acetaminophen 5-300 MG TABS TAKE 1 TABLET BY MOUTH EVERY 6 HOURS AS NEEDED FOR PAIN 06/05/13  Yes [name], MD  Magnesium 250 MG TABS Take 1 tablet by mouth daily.   Yes Historical Provider, MD  morphine (MS CONTIN) 30 MG 12 hr tablet Take 1 tablet (30 mg total) by mouth every 8 (eight) hours. 06/05/13  Yes [name], MD  neomycin-bacitracin-polymyxin (NEOSPORIN) [phone] ointment Apply topically 4 (four) times daily. Pt states she puts it in nares sometimes   Yes Historical Provider, MD  Alum & Mag Hydroxide-Simeth (MAGIC MOUTHWASH W/LIDOCAINE) SOLN Take 15 mLs by mouth 3 (three) times daily as needed. 06/11/13   Lurline Hare, MD  sucralfate (CARAFATE) 1 GM/10ML suspension Take 10 mLs (1 g total) by mouth 4 (four) times daily. 06/11/13   Lurline Hare, MD    Allergies:   Allergies  Allergen Reactions  . Sulfonamide Derivatives Nausea And Vomiting    Social History:  reports that she quit smoking about 12 years ago. Her smoking use included Cigarettes. She smoked 0.50 packs per day. She does not have any smokeless tobacco history  on file. She reports that she does not drink alcohol or use illicit drugs.  Family History: Family History  Problem Relation Age of Onset  . Cancer Neg Hx     Physical Exam: Filed Vitals:   06/12/13 2045 06/12/13 2100 06/12/13 2115 06/12/13 2139  BP: 93/72 94/77 84/69    Pulse: 112 110 114   Temp:      TempSrc:      Resp: 26 26 29    Height:    5\' 4"  (1.626 m)  Weight:    58.514 kg (129 lb)  SpO2: 100% 100% 99%    General appearance: alert and moderate distress Head: Normocephalic, without obvious abnormality, atraumatic Eyes: negative Nose: Nares normal. Septum midline. Mucosa normal. No drainage or sinus tenderness. Neck: no JVD and supple, symmetrical, trachea midline Lungs: markedly  decreased bs on rt.  rhonchi left Heart: irregularly irregular rhythm Abdomen: soft, non-tender; bowel sounds normal; no masses,  no organomegaly Extremities: extremities normal, atraumatic, no cyanosis or edema Pulses: 2+ and symmetric Skin: Skin color, texture, turgor normal. No rashes or lesions Neurologic: cn 2 thru 12 grossly intact.      Labs on Admission:   Recent Labs  06/12/13 1525  NA 123*  K 3.6  CL 89*  CO2 25  GLUCOSE 134*  BUN 26*  CREATININE 0.57  CALCIUM 8.8    Recent Labs  06/12/13 1525  AST 12  ALT 20  ALKPHOS 100  BILITOT 0.6  PROT 6.0  ALBUMIN 2.9*    Recent Labs  06/12/13 1525  WBC 13.9*  NEUTROABS 12.8*  HGB 13.1  HCT 37.2  MCV 84.2  PLT 185    Radiological Exams on Admission: Dg Chest 2 View  06/12/2013   *RADIOLOGY REPORT*  Clinical Data: Shortness of breath.  History of right lung cancer.  CHEST - 2 VIEW  Comparison: Chest CT 06/12/2013  Findings: Persistent volume loss and right lung opacity identified. Nodular opacities are identified at the left lung base, better seen on CT of the same day.  There is gaseous distension of the stomach.  IMPRESSION:  1.  Stable opacity/volume loss in the right lung. 2.  Nodularity in the left lung base, better seen on CT of the same day.  This represents a change since prior studies.   Original Report Authenticated By: Norva Pavlov, M.D.   Ct Angio Chest Pe W/cm &/or Wo Cm  06/12/2013   *RADIOLOGY REPORT*  Clinical Data: History of COPD and lung cancer with osseous metastatic disease, now with shortness of breath, evaluate for pulmonary embolism  CT ANGIOGRAPHY CHEST  Technique:  Multidetector CT imaging of the chest using the standard protocol during bolus administration of intravenous contrast. Multiplanar reconstructed images including MIPs were obtained and reviewed to evaluate the vascular anatomy.  Contrast: OMNIPAQUE IOHEXOL 350 MG/ML SOLN  Comparison: CT of the chest, abdomen and pelvis -  05/13/2013; chest CT - 08/24/2012; 03/09/2012  Findings:  There is adequate opacification of the pulmonary arterial system of the main pulmonary artery measuring 677 HU. There is expected rapid tapering of the near occlusion of the right main pulmonary artery due to chronic complete atelectasis/collapse of the right lung. There are no discrete filling defects within the pulmonary arterial tree to suggest pulmonary embolism.  The pulmonary artery remains enlarged measuring 3.4 cm in diameter.  Normal heart size.  No definite pericardial effusion.  Normal caliber thoracic aorta.  Possible bovine configuration of the aortic arch.  No definite thoracic aortic dissection  or periaortic stranding.  ---------------------------------------------------------  Nonvascular findings:  There is persistent findings of complete atelectasis/collapse of the remaining right lung.  The previously described mass within the atelectatic remaining right lung is difficult to definitively measure on the present examination due to associated volume loss and heterogeneous enhancement of the remaining pulmonary parenchyma.  There is no discrete enhancing nodularity of the right sided pleural. Grossly unchanged appearance of approximately 0.6 cm left-sided distal periaortic lymph node (image 51, series five). No new mediastinal or contralateral left hilar lymphadenopathy.  There is been interval development of debris within the residua of the right mainstem bronchus (images 29 - 31, series eight).  This finding is associated with development of ill-defined nodular opacities within the aerated left lung, most conspicuously within the left lower lobe many of which demonstrate a tree in bud configuration (representative images 39 and 43, series eight). While no discrete filling defects are seen within the left main stem bronchi, these constellation of findings are worrisome for aspiration.  Early arterial phase evaluation of the superior aspect of  the abdomen suggests a possible small hiatal hernia.  Redemonstrated extensive osseous metastatic disease to the thoracic spine again most conspicuously involving the C5 and T2 vertebral bodies as well as the right pedicle of T10 as well as the imaged proximal aspect of the left humerus.  Grossly unchanged severe compression deformity involving the posterior aspect of the T8 vertebral body.  IMPRESSION:  1.  No evidence of pulmonary embolism. 2.  Grossly unchanged complete atelectasis/collapse of the right lower lobe.  Previously identified heterogeneously enhancing right- sided lung mass is not well demonstrated on the present examination secondary to the adjacent atelectatic lung.  3.  Interval development of a large amount of debris within the residua of the right mainstem bronchus with associated development of ill-defined nodular tree in bud opacities primarily within the contralateral left lower lobe worrisome for aspiration.  4.  Grossly unchanged osseous metastatic disease to the chest as detailed above.  5.  Chronic enlargement of the caliber of the main pulmonary artery which is nonspecific in the setting of complete collapse/atelectasis in the right lung but could be indicative of pulmonary arterial hypertension.   Original Report Authenticated By: Tacey Ruiz, MD   Dg Chest Portable 1 View  06/12/2013   *RADIOLOGY REPORT*  Clinical Data: Weakness.  Lung cancer  PORTABLE CHEST - 1 VIEW  Comparison: 06/12/2013  Findings: There is a large right pleural effusion.  This is similar in volume to the previous exam.  There is complete opacification of the right hemithorax.  Nodularity in the left lower lobe is unchanged.  Diffuse bronchitic changes are identified.  There is a sclerotic metastasis identified within the proximal left humerus.  IMPRESSION:  1.  No change and large right pleural effusion with complete opacification of the right hemithorax. 2.  Left lung nodularity as before.   Original Report  Authenticated By: Signa Kell, M.D.    Assessment/Plan  62 yo female with stage 4 lung cancer, worsening acute resp failure 1. A resp failure with hypoxic 2.  afib w rvr 3.  Sob 4.  Borderline hypotension 5.  Generalized weakness 6.  Hyponatremia 7.  Stage 4 lung cancer   CCM to see pt.  Place in icu.  Updated sons on her status.  She wishes to be full code, but doubt intubation would be of much benefit.  In afib with rvr cont card gtt.  ?????underlying pna.  Sons would like  to talk to palliative care tomorrow.  FULL CODE.    Spoke to CCM dr Kendrick Fries, thinks underlying pna.  Agree with starting vanc/zosyn.  Pt is now DNR per their discussion.     Shakora Nordquist A 06/12/2013, 9:44 PM

## 2013-06-12 NOTE — Progress Notes (Signed)
[facility] Psychosocial Distress Screening Clinical Social Work  Clinical Social Work was referred by distress screening protocol.  The patient scored a 7 on the Psychosocial Distress Thermometer which indicates moderate distress. Clinical Social Worker contacted patient by phone to assess for distress and other psychosocial needs. Patient had difficulty communicating with CSW, patient reports feeling congested today.  The patient indicated no concerns and reports having adequate support at home.     Clinical Social Worker follow up needed: no  If yes, follow up plan:   Kathrin Penner, MSW, LCSW Clinical Social Worker [facility] [phone]

## 2013-06-12 NOTE — Progress Notes (Signed)
Utilization Review completed.  Deedra Pro RN CM  

## 2013-06-12 NOTE — ED Notes (Signed)
Bed: WA01 Expected date:  Expected time:  Means of arrival:  Comments: EMS 

## 2013-06-12 NOTE — ED Provider Notes (Signed)
CSN: [id number]     Arrival date & time 06/12/13  1411 History   First MD Initiated Contact with Patient 06/12/13 1503     Chief Complaint  Patient presents with  . Weakness   (Consider location/radiation/quality/duration/timing/severity/associated sxs/prior Treatment) HPI Pt presents c/o of generalized weakness, increased mucus production, and shortness of breath. Pt has a significant PMH including metastatic non-small cell lung cancer s/p .Marland Kitchen And currently undergoing full brain radiation. Pt's sons report much of the story. Son found the pt around 3-4 AM this morning on the floor. She said she was going to the bathroom and her legs became very weak and gave out on her. She denies hitting her head, dizziness, syncope, LOC, visual changes, sensory changes. Her son had to pick her up to put her back in bed. This morning when she woke up she was having increased shortness of breath at rest, audible noises when breathing, and increased mucous production.Her baseline is dyspnea on exertion. Pt denies productive cough, chest pain, dizziness, headache, visual changes, sinus congestion, throat pain, abdominal pain, nausea, vomiting, diarrhea, constipation, urinary symptoms, sensory changes, focal weakness, fever, chills. Pt denies every using any inhalers or breathing treatments to help with her shortness of breath and does not use home oxygen. Pt's son reports yesterday she decreased her oral steroids she has been taking for 3-4 months with her radiation treatment from 2 pills to 1 pill to begin a taper.  Past Medical History  Diagnosis Date  . COPD (chronic obstructive pulmonary disease)   . Hx of radiation therapy   . Hx antineoplastic chemotherapy   . Shortness of breath   . GERD (gastroesophageal reflux disease)   . Lung cancer     lung ca dx 12/10  . Lung cancer 09/01/2011    bone mets  . 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 Abdominal hysterectomy    . Surgery right leg, rod and pin     Family History  Problem Relation Age of Onset  . Cancer Neg Hx    History  Substance Use Topics  . Smoking status: Former Smoker -- 0.50 packs/day    Types: Cigarettes    Quit date: 10/03/2000  . Smokeless tobacco: Not on file     Comment: Began smoking age 32, stopped at age 5.  Marland Kitchen Alcohol Use: No   OB History   Grav Para Term Preterm Abortions TAB SAB Ect Mult Living                 Review of Systems All other systems negative except as documented in the HPI. All pertinent positives and negatives as reviewed in the HPI.  Allergies  Sulfonamide derivatives  Home Medications   Current Outpatient Rx  Name  Route  Sig  Dispense  Refill  . ALPRAZolam (XANAX) 0.5 MG tablet   Oral   Take 1 mg by mouth at bedtime as needed for sleep.          . calcium-vitamin D (OSCAL WITH D) 500-200 MG-UNIT per tablet   Oral   Take 1 tablet by mouth daily.           . clindamycin (CLEOCIN T) 1 % external solution   Topical   Apply 1 application topically 2 (two) times daily as needed (for scalp lesions).          Marland Kitchen dexamethasone (  DECADRON) 4 MG tablet   Oral   Take 4 mg by mouth daily.          . Docosanol (ABREVA) 10 % CREA   Apply externally   Apply 1 application topically as needed. Blister/scab on bottom lip         . ferrous sulfate 325 (65 FE) MG tablet   Oral   Take 325 mg by mouth 2 (two) times daily.           . Hydrocodone-Acetaminophen 5-300 MG TABS      TAKE 1 TABLET BY MOUTH EVERY 6 HOURS AS NEEDED FOR PAIN   60 each   0   . Magnesium 250 MG TABS   Oral   Take 1 tablet by mouth daily.         Marland Kitchen morphine (MS CONTIN) 30 MG 12 hr tablet   Oral   Take 1 tablet (30 mg total) by mouth every 8 (eight) hours.   90 tablet   0   . neomycin-bacitracin-polymyxin (NEOSPORIN) [phone] ointment   Topical   Apply topically 4 (four) times daily. Pt states she  puts it in nares sometimes         . Alum & Mag Hydroxide-Simeth (MAGIC MOUTHWASH W/LIDOCAINE) SOLN   Oral   Take 15 mLs by mouth 3 (three) times daily as needed.   480 mL   1     80 mg each: benadryl, nystatin and mylanta to tota ...   . sucralfate (CARAFATE) 1 GM/10ML suspension   Oral   Take 10 mLs (1 g total) by mouth 4 (four) times daily.   420 mL   0    BP 160/95  Pulse 117  Temp(Src) 98.1 F (36.7 C) (Oral)  Resp 24  SpO2 96% Physical Exam  Nursing note and vitals reviewed. Constitutional: She is oriented to person, place, and time. She appears well-developed and well-nourished. She is cooperative. She is easily aroused. No distress.  HENT:  Head: Normocephalic and atraumatic.  Nose: Nose normal.  Mouth/Throat: Uvula is midline, oropharynx is clear and moist and mucous membranes are normal.  Eyes: Pupils are equal, round, and reactive to light.  Cardiovascular: S1 normal, S2 normal, normal heart sounds and normal pulses.  An irregular rhythm present. Tachycardia present.   Pulmonary/Chest: No accessory muscle usage. Tachypnea noted. No respiratory distress. She has decreased breath sounds in the right upper field, the right middle field and the right lower field. She has wheezes in the right upper field, the right middle field, the right lower field, the left upper field, the left middle field and the left lower field. She has rhonchi in the right upper field, the right middle field, the right lower field, the left upper field, the left middle field and the left lower field. She has rales in the right upper field, the right middle field, the right lower field, the left upper field, the left middle field and the left lower field. She exhibits no tenderness.  Pt resting comfortably in the bed. Gets mildly out of breath in conversation. Mild gargles heard at rest.   Abdominal: Soft. Normal appearance and bowel sounds are normal. She exhibits no distension and no mass. There is  no tenderness. There is no rebound and no guarding.  Neurological: She is alert, oriented to person, place, and time and easily aroused. She has normal reflexes. She displays normal reflexes. No cranial nerve deficit or sensory deficit. She exhibits normal muscle tone. Coordination  normal.  Skin: Skin is warm and dry. She is not diaphoretic.  Psychiatric: She has a normal mood and affect. Her behavior is normal. Judgment and thought content normal.    ED Course  Procedures (including critical care time) Labs Review Labs Reviewed  CBC WITH DIFFERENTIAL - Abnormal; Notable for the following:    WBC 13.9 (*)    Neutrophils Relative % 92 (*)    Neutro Abs 12.8 (*)    Lymphocytes Relative 4 (*)    Lymphs Abs 0.6 (*)    All other components within normal limits  COMPREHENSIVE METABOLIC PANEL  URINALYSIS, ROUTINE W REFLEX MICROSCOPIC  POCT I-STAT TROPONIN I     Pt became increasingly tachycardic (150) and tachypnic. Pulse ox was 92% on 3L [place]. Increasing audible gargling sounds were heard with breathing and increased work with breathing. Pt was started on Bipap and CXR was ordered which demonstrated no acute changes since the previous CXR. Discussed potential of intubation with patient and sons and pt expressed desire to be intubated if necessary. Critical care and the hospitalist were called. The patient improved with Bipap. The patient is stable but significantly ill.  MDM  CRITICAL CARE Performed by: Carlyle Dolly Total critical care time: 60 mins Critical care time was exclusive of separately billable procedures and treating other patients. Critical care was necessary to treat or prevent imminent or life-threatening deterioration. Critical care was time spent personally by me on the following activities: development of treatment plan with patient and/or surrogate as well as nursing, discussions with consultants, evaluation of patient's response to treatment, examination of patient,  obtaining history from patient or surrogate, ordering and performing treatments and interventions, ordering and review of laboratory studies, ordering and review of radiographic studies, pulse oximetry and re-evaluation of patient's condition.;   Date: 06/17/2013  Rate:141  Rhythm: atrial fibrillation  QRS Axis: normal  Intervals: normal  ST/T Wave abnormalities: normal  Conduction Disutrbances:none  Narrative Interpretation:   Old EKG Reviewed: changes noted    Carlyle Dolly, PA-C 06/17/13 1550

## 2013-06-12 NOTE — Consult Note (Signed)
PULMONARY  / CRITICAL CARE MEDICINE  Name: Makayla [name] MRN: [id number] DOB: [birth year]    ADMISSION DATE:  06/12/2013 CONSULTATION DATE:  06/12/2013  REFERRING MD :  Jeraldine Loots PRIMARY SERVICE: PCCM  CHIEF COMPLAINT:  Shortness of breath  BRIEF PATIENT DESCRIPTION: 62 y/o female with metastatic lung cancer admitted on 06/12/2013 with Acute respiratory failure due to flash pulm edema from afib/rvr likely set off by a respiratory infection.  SIGNIFICANT EVENTS / STUDIES:  9/10 admission 9/10 CT angio chest > no pe, tree-in-bud and consolidation in the left lung consistent with infection; complete collapse R lung, chronic R pleural effusion unchanged.  LINES / TUBES: 9/10 BIPAP >>  CULTURES: 9/10 blood >>  ANTIBIOTICS: 9/10 vanc >> 9/10 zosyn >>  HISTORY OF PRESENT ILLNESS:  62 y/o female with metastatic lung cancer admitted on 06/12/2013 with Acute respiratory failure due to flash pulm edema from afib/rvr likely set off by a respiratory infection. Her son provides most of the history because she is on BIPAP.  She received radiation therapy yesterday (Whole Brain Radiation) and was a little weak afterwards which is normal for her.  This morning she was dypsnic with chest congestion, and a cough.  No fevers or chills.  She came to the ED for increased dyspnea.  Initially she was comfortable but developed afib with RVR, hypertension, hypoxemia and marked respiratory distress.  She received BIPAP, diltiazem, lasix, and nitroglyerin and her condition improved.  She is now comfortable on BIPAP.  PAST MEDICAL HISTORY :  Past Medical History  Diagnosis Date  . COPD (chronic obstructive pulmonary disease)   . Hx of radiation therapy   . Hx antineoplastic chemotherapy   . Shortness of breath   . GERD (gastroesophageal reflux disease)   . Lung cancer     lung ca dx 12/10  . Lung cancer 09/01/2011    bone mets  . 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 Abdominal hysterectomy    . Surgery right leg, rod and pin     Prior to Admission medications   Medication Sig Start Date End Date Taking? Authorizing Provider  ALPRAZolam Prudy Feeler) 0.5 MG tablet Take 1 mg by mouth at bedtime as needed for sleep.    Yes Historical Provider, MD  calcium-vitamin D (OSCAL WITH D) 500-200 MG-UNIT per tablet Take 1 tablet by mouth daily.     Yes Historical Provider, MD  clindamycin (CLEOCIN T) 1 % external solution Apply 1 application topically 2 (two) times daily as needed (for scalp lesions).    Yes Historical Provider, MD  dexamethasone (DECADRON) 4 MG tablet Take 4 mg by mouth daily.  05/16/13  Yes [name], MD  Docosanol (ABREVA) 10 % CREA Apply 1 application topically as needed. Blister/scab on bottom lip   Yes Historical Provider, MD  ferrous sulfate 325 (65 FE) MG tablet Take 325 mg by mouth 2 (two) times daily.     Yes Historical Provider, MD  Hydrocodone-Acetaminophen 5-300 MG TABS TAKE 1 TABLET BY MOUTH EVERY 6 HOURS AS NEEDED FOR PAIN 06/05/13  Yes [name], MD  Magnesium 250 MG TABS Take 1 tablet by mouth daily.   Yes Historical Provider, MD  morphine (MS CONTIN) 30 MG 12 hr tablet Take 1 tablet (30 mg total) by mouth every 8 (eight) hours. 06/05/13  Yes [name], MD  neomycin-bacitracin-polymyxin (NEOSPORIN) [phone] ointment Apply topically  4 (four) times daily. Pt states she puts it in nares sometimes   Yes Historical Provider, MD  Alum & Mag Hydroxide-Simeth (MAGIC MOUTHWASH W/LIDOCAINE) SOLN Take 15 mLs by mouth 3 (three) times daily as needed. 06/11/13   Lurline Hare, MD  sucralfate (CARAFATE) 1 GM/10ML suspension Take 10 mLs (1 g total) by mouth 4 (four) times daily. 06/11/13   Lurline Hare, MD   Allergies  Allergen Reactions  . Sulfonamide Derivatives Nausea And Vomiting    FAMILY HISTORY:  Family History  Problem Relation Age of Onset  . Cancer Neg Hx    SOCIAL  HISTORY:  reports that she quit smoking about 12 years ago. Her smoking use included Cigarettes. She smoked 0.50 packs per day. She does not have any smokeless tobacco history on file. She reports that she does not drink alcohol or use illicit drugs.  REVIEW OF SYSTEMS:   Gen: Denies fever, chills, weight change,+fatigue, night sweats HEENT: Denies blurred vision, double vision, hearing loss, tinnitus, sinus congestion, rhinorrhea, sore throat, neck stiffness, dysphagia PULM: per HPI CV: Denies chest pain, edema, orthopnea, paroxysmal nocturnal dyspnea, + palpitations GI: Denies abdominal pain, nausea, vomiting, diarrhea, hematochezia, melena, constipation, change in bowel habits GU: Denies dysuria, hematuria, + polyuria, oliguria, urethral discharge Endocrine: Denies hot or cold intolerance, polyuria, polyphagia or appetite change Derm: Denies rash, dry skin, scaling or peeling skin change Heme: Denies easy bruising, bleeding, bleeding gums Neuro: Denies headache, numbness, weakness, slurred speech, loss of memory or consciousness   SUBJECTIVE:   VITAL SIGNS: Temp:  [98.1 F (36.7 C)] 98.1 F (36.7 C) (09/10 1400) Pulse Rate:  [100-148] 110 (09/10 2100) Resp:  [20-30] 26 (09/10 2100) BP: (79-173)/(57-116) 94/77 mmHg (09/10 2100) SpO2:  [91 %-100 %] 100 % (09/10 2100) HEMODYNAMICS:   VENTILATOR SETTINGS:   INTAKE / OUTPUT: Intake/Output   None     PHYSICAL EXAMINATION:  Gen: comfortable on BIPAP HEENT: NCAT, PERRL, EOMi, BIPAP mask in place PULM: Rhonchi L base, no crackles, no wheezing, good air movement; no breath sounds on R CV: Tachy, regular, no mgr AB: BS+, soft, nontender, no hsm Ext: warm, no edema, acyanotic Neuro: Awake and alert, answers questions, follows commands, MAEW  LABS:  CBC Recent Labs     06/12/13  1525  WBC  13.9*  HGB  13.1  HCT  37.2  PLT  185   Coag's No results found for this basename: APTT, INR,  in the last 72 hours BMET Recent  Labs     06/12/13  1525  NA  123*  K  3.6  CL  89*  CO2  25  BUN  26*  CREATININE  0.57  GLUCOSE  134*   Electrolytes Recent Labs     06/12/13  1525  CALCIUM  8.8   Sepsis Markers No results found for this basename: LACTICACIDVEN, PROCALCITON, O2SATVEN,  in the last 72 hours ABG No results found for this basename: PHART, PCO2ART, PO2ART,  in the last 72 hours Liver Enzymes Recent Labs     06/12/13  1525  AST  12  ALT  20  ALKPHOS  100  BILITOT  0.6  ALBUMIN  2.9*   Cardiac Enzymes No results found for this basename: TROPONINI, PROBNP,  in the last 72 hours Glucose No results found for this basename: GLUCAP,  in the last 72 hours  Imaging   CXR: complete opacification R, ? Nodularity L base  ASSESSMENT / PLAN:  PULMONARY A:  Acute hypoxemic respiratory  failure > due to HCAP and complicated by likely flash pulm edema in setting of afib/rvr which is now controlled  HCAP > tree-in-bud opacities on CT chest, cough, congestion  COPD? Unlikely, only smoked for 5 years, no inhalers, no history of recurrent bronchitis per son P:   -BIPAP prn -no intubation -continue diuresis -see ID -hold ABG  CARDIOVASCULAR A:  Afib with RVR now resolved with dilt gtt; first time this has happened, likely triggered by pulmonary infection  P:  -continue dilt for now, could change to po -lasix overnight -tele -hold off on warfarin for now  RENAL A:  No acute issues P:   -monitor UOP with lasix  GASTROINTESTINAL A:  No acute issues P:   -advance diet on 9/11 post d/c bipap  HEMATOLOGIC/ONCOLOGY A:  Metastatic Lung Adeno refractory to therapy, brain mets, diagnosed 2010 P:  -would consult oncology on 9/11  INFECTIOUS A:  HCAP P:   -vanc/zosyn -f/u cultures  ENDOCRINE A:  No acute issues P:   -monitor glucose  NEUROLOGIC A:  Brain mets, otherwise no acute issues P:   -per Oncology   Code status: DNR/DNI; I had a lengthy conversation with the  patient and her son Lodema Pilot in the [facility] ED and explained to them that lung cancer patients admitted to the ICU requiring life support have terrible outcomes.  This is well documented in the literature.  I advised that my recommendation was to continue BIPAP, do not escalate care to intubation, pressors, or receive CPR.  She agreed with my suggestion and her son was supportive of her decision.  TODAY'S SUMMARY:   I have personally obtained a history, examined the patient, evaluated laboratory and imaging results, formulated the assessment and plan and placed orders. CRITICAL CARE: The patient is critically ill with multiple organ systems failure and requires high complexity decision making for assessment and support, frequent evaluation and titration of therapies, application of advanced monitoring technologies and extensive interpretation of multiple databases. Critical Care Time devoted to patient care services described in this note is 60 minutes.   Fonnie Jarvis Pulmonary and Critical Care Medicine [facility] Pager: [phone]  06/12/2013, 9:16 PM

## 2013-06-12 NOTE — Progress Notes (Signed)
ANTIBIOTIC CONSULT NOTE - INITIAL  Pharmacy Consult for:  Vancomycin and Zosyn Indication:  Suspected pneumonia (HCAP)  Allergies  Allergen Reactions  . Sulfonamide Derivatives Nausea And Vomiting    Patient Measurements: Height: 5\' 4"  (162.6 cm) (taken 06/05/13) Weight: 129 lb (58.514 kg) (taken 06/11/13) IBW/kg (Calculated) : 54.7   Vital Signs: Temp: 98.1 F (36.7 C) (09/10 1400) Temp src: Oral (09/10 1400) BP: 84/69 mmHg (09/10 2115) Pulse Rate: 114 (09/10 2115)  Labs:  Recent Labs  06/12/13 1525  WBC 13.9*  HGB 13.1  PLT 185  CREATININE 0.57   Estimated Creatinine Clearance: 63.8 ml/min (by C-G formula based on Cr of 0.57).    Medical History: Past Medical History  Diagnosis Date  . COPD (chronic obstructive pulmonary disease)   . Hx of radiation therapy   . Hx antineoplastic chemotherapy   . Shortness of breath   . GERD (gastroesophageal reflux disease)   . Lung cancer     lung ca dx 12/10  . Lung cancer 09/01/2011    bone mets  . 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  Goals of Therapy:   Vancomycin trough levels 15-20 mcg/ml  Eradication of infection  Plan:   Zosyn 3.375 grams IV every 8 hours, each dose infused over 4 hours  Vancomycin 1000 mg IV once, then 750 mg IV every 12 hours  Vancomycin levels as needed to guide dosing decisions  [employer] 06/12/2013 9:59 PM

## 2013-06-12 NOTE — ED Notes (Signed)
Pt unable to go upstairs at this time bc staff not available in ICU. Charge nurse will call back.

## 2013-06-12 NOTE — Progress Notes (Signed)
Per CCM MD no need for ABG at this time, RT to monitor and assess as needed.

## 2013-06-12 NOTE — ED Notes (Signed)
Per EMS pt coming from home with c/o generalized weakness.

## 2013-06-13 ENCOUNTER — Ambulatory Visit: Payer: Managed Care, Other (non HMO)

## 2013-06-13 LAB — MRSA PCR SCREENING: MRSA by PCR: POSITIVE — AB

## 2013-06-13 MED ORDER — CHLORHEXIDINE GLUCONATE CLOTH 2 % EX PADS
6.0000 | MEDICATED_PAD | Freq: Every day | CUTANEOUS | Status: AC
  Administered 2013-06-13 – 2013-06-17 (×5): 6 via TOPICAL

## 2013-06-13 MED ORDER — BIOTENE DRY MOUTH MT LIQD
15.0000 mL | Freq: Two times a day (BID) | OROMUCOSAL | Status: DC
  Administered 2013-06-13 – 2013-06-28 (×27): 15 mL via OROMUCOSAL

## 2013-06-13 MED ORDER — CHLORHEXIDINE GLUCONATE 0.12 % MT SOLN
15.0000 mL | Freq: Two times a day (BID) | OROMUCOSAL | Status: DC
  Administered 2013-06-13 – 2013-06-28 (×27): 15 mL via OROMUCOSAL
  Filled 2013-06-13 (×32): qty 15

## 2013-06-13 MED ORDER — MUPIROCIN 2 % EX OINT
1.0000 "application " | TOPICAL_OINTMENT | Freq: Two times a day (BID) | CUTANEOUS | Status: AC
  Administered 2013-06-13 – 2013-06-17 (×9): 1 via NASAL
  Filled 2013-06-13 (×2): qty 22

## 2013-06-13 MED ORDER — DEXAMETHASONE SODIUM PHOSPHATE 4 MG/ML IJ SOLN
4.0000 mg | Freq: Every day | INTRAMUSCULAR | Status: DC
  Administered 2013-06-13 – 2013-06-28 (×15): 4 mg via INTRAVENOUS
  Filled 2013-06-13 (×16): qty 1

## 2013-06-13 MED ORDER — INFLUENZA VAC SPLIT QUAD 0.5 ML IM SUSP
0.5000 mL | Freq: Once | INTRAMUSCULAR | Status: AC
  Administered 2013-06-13: 0.5 mL via INTRAMUSCULAR
  Filled 2013-06-13: qty 0.5

## 2013-06-13 NOTE — Progress Notes (Signed)
[facility] PROGRESS NOTE  Makayla [name] EAV:[id number] DOB: April 07, 1951 DOA: 06/12/2013 PCP: [name], MD  Brief narrative: Makayla [name] is an 62 y.o. female with a PMH of metastatic lung cancer who was admitted on 06/12/2013 with acute respiratory failure secondary to flash pulmonary edema from atrial fibrillation with rapid ventricular response in the setting of acute pneumonia. She is being followed by critical care team.  Assessment/Plan: Principal Problem:   Acute respiratory failure with hypoxia / SOB (shortness of breath) -Secondary to HCAP and flash pulmonary edema in the setting of atrial fibrillation with rapid ventricular response. -Requiring BiPAP which we will continue as needed. -Diuresed to address flash pulmonary edema. Heart rate suboptimally controlled. Continue Cardizem drip. -Pulmonology/critical care following. -CT scan consistent with infection/possible aspiration. No evidence of pulmonary embolism. 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   COPD UNSPECIFIED -Unclear diagnosis.   Lung cancer -Will notify [name] of patient's admission. -Current therapy: Xgeva 120 mg subcutaneously given on a monthly basis for bone metastasis status post 16 treatments    Multiple brain mets -Receiving whole brain irradiation under the care of Dr. Michell Heinrich. -Continue Decadron.   HCAP (healthcare-associated pneumonia) -Continue empiric vancomycin and Zosyn.   Atrial fibrillation with rapid ventricular response -Continue Cardizem drip for rate control.   Code Status: DNR Family Communication: No family currently at the bedside. Disposition Plan: Home versus residential hospice.   Medical Consultants:  Dr. Max Fickle, Critical care  Other Consultants:  None.  Anti-infectives:  Vancomycin 06/12/2013--->  Zosyn 06/12/2013--->  HPI/Subjective: Makayla [name] is awake. Her breathing has improved on BiPAP therapy. She denies pain,  chest tightness, and nausea.  Objective: Filed Vitals:   06/13/13 0328 06/13/13 0400 06/13/13 0500 06/13/13 0600  BP: 110/57 74/59 71/49  102/76  Pulse: 101 102 102 107  Temp:      TempSrc:      Resp: 22 20 20 21   Height:      Weight:      SpO2: 100% 100% 100% 99%    Intake/Output Summary (Last 24 hours) at 06/13/13 0716 Last data filed at 06/13/13 0600  Gross per 24 hour  Intake   1565 ml  Output    400 ml  Net   1165 ml    Exam: Gen:  NAD Cardiovascular:  Regular, tachycardic Respiratory:  Lungs CTAB Gastrointestinal:  Abdomen soft, NT/ND, + BS Extremities:  Trace edema  Data Reviewed: Basic Metabolic Panel:  Recent Labs Lab 06/12/13 1525  NA 123*  K 3.6  CL 89*  CO2 25  GLUCOSE 134*  BUN 26*  CREATININE 0.57  CALCIUM 8.8   GFR Estimated Creatinine Clearance: 63.8 ml/min (by C-G formula based on Cr of 0.57). Liver Function Tests:  Recent Labs Lab 06/12/13 1525  AST 12  ALT 20  ALKPHOS 100  BILITOT 0.6  PROT 6.0  ALBUMIN 2.9*   CBC:  Recent Labs Lab 06/12/13 1525  WBC 13.9*  NEUTROABS 12.8*  HGB 13.1  HCT 37.2  MCV 84.2  PLT 185   Microbiology Recent Results (from the past 240 hour(s))  MRSA PCR SCREENING     Status: Abnormal   Collection Time    06/12/13 10:43 PM      Result Value Range Status   MRSA by PCR POSITIVE (*) NEGATIVE Final   Comment:            The GeneXpert MRSA Assay (FDA     approved for NASAL specimens  only), is one component of a     comprehensive MRSA colonization     surveillance program. It is not     intended to diagnose MRSA     infection nor to guide or     monitor treatment for     MRSA infections.     RESULT CALLED TO, READ BACK BY AND VERIFIED WITH:     [facility] RN @ 0127 ON 9.11.2014 BY MCREYNOLDS,B     Procedures and Diagnostic Studies:  Dg Chest 2 View 06/12/2013   *RADIOLOGY REPORT*  Clinical Data: Shortness of breath.  History of right lung cancer.  CHEST - 2 VIEW  Comparison: Chest CT  06/12/2013  Findings: Persistent volume loss and right lung opacity identified. Nodular opacities are identified at the left lung base, better seen on CT of the same day.  There is gaseous distension of the stomach.  IMPRESSION:  1.  Stable opacity/volume loss in the right lung. 2.  Nodularity in the left lung base, better seen on CT of the same day.  This represents a change since prior studies.   Original Report Authenticated By: Norva Pavlov, M.D.    Ct Angio Chest Pe W/cm &/or Wo Cm 06/12/2013   *RADIOLOGY REPORT*  Clinical Data: History of COPD and lung cancer with osseous metastatic disease, now with shortness of breath, evaluate for pulmonary embolism  CT ANGIOGRAPHY CHEST  Technique:  Multidetector CT imaging of the chest using the standard protocol during bolus administration of intravenous contrast. Multiplanar reconstructed images including MIPs were obtained and reviewed to evaluate the vascular anatomy.  Contrast: OMNIPAQUE IOHEXOL 350 MG/ML SOLN  Comparison: CT of the chest, abdomen and pelvis - 05/13/2013; chest CT - 08/24/2012; 03/09/2012  Findings:  There is adequate opacification of the pulmonary arterial system of the main pulmonary artery measuring 677 HU. There is expected rapid tapering of the near occlusion of the right main pulmonary artery due to chronic complete atelectasis/collapse of the right lung. There are no discrete filling defects within the pulmonary arterial tree to suggest pulmonary embolism.  The pulmonary artery remains enlarged measuring 3.4 cm in diameter.  Normal heart size.  No definite pericardial effusion.  Normal caliber thoracic aorta.  Possible bovine configuration of the aortic arch.  No definite thoracic aortic dissection or periaortic stranding.  ---------------------------------------------------------  Nonvascular findings:  There is persistent findings of complete atelectasis/collapse of the remaining right lung.  The previously described mass within  the atelectatic remaining right lung is difficult to definitively measure on the present examination due to associated volume loss and heterogeneous enhancement of the remaining pulmonary parenchyma.  There is no discrete enhancing nodularity of the right sided pleural. Grossly unchanged appearance of approximately 0.6 cm left-sided distal periaortic lymph node (image 51, series five). No new mediastinal or contralateral left hilar lymphadenopathy.  There is been interval development of debris within the residua of the right mainstem bronchus (images 29 - 31, series eight).  This finding is associated with development of ill-defined nodular opacities within the aerated left lung, most conspicuously within the left lower lobe many of which demonstrate a tree in bud configuration (representative images 39 and 43, series eight). While no discrete filling defects are seen within the left main stem bronchi, these constellation of findings are worrisome for aspiration.  Early arterial phase evaluation of the superior aspect of the abdomen suggests a possible small hiatal hernia.  Redemonstrated extensive osseous metastatic disease to the thoracic spine again most conspicuously involving  the C5 and T2 vertebral bodies as well as the right pedicle of T10 as well as the imaged proximal aspect of the left humerus.  Grossly unchanged severe compression deformity involving the posterior aspect of the T8 vertebral body.  IMPRESSION:  1.  No evidence of pulmonary embolism. 2.  Grossly unchanged complete atelectasis/collapse of the right lower lobe.  Previously identified heterogeneously enhancing right- sided lung mass is not well demonstrated on the present examination secondary to the adjacent atelectatic lung.  3.  Interval development of a large amount of debris within the residua of the right mainstem bronchus with associated development of ill-defined nodular tree in bud opacities primarily within the contralateral left  lower lobe worrisome for aspiration.  4.  Grossly unchanged osseous metastatic disease to the chest as detailed above.  5.  Chronic enlargement of the caliber of the main pulmonary artery which is nonspecific in the setting of complete collapse/atelectasis in the right lung but could be indicative of pulmonary arterial hypertension.   Original Report Authenticated By: Tacey Ruiz, MD     Dg Chest Portable 1 View 06/12/2013   *RADIOLOGY REPORT*  Clinical Data: Weakness.  Lung cancer  PORTABLE CHEST - 1 VIEW  Comparison: 06/12/2013  Findings: There is a large right pleural effusion.  This is similar in volume to the previous exam.  There is complete opacification of the right hemithorax.  Nodularity in the left lower lobe is unchanged.  Diffuse bronchitic changes are identified.  There is a sclerotic metastasis identified within the proximal left humerus.  IMPRESSION:  1.  No change and large right pleural effusion with complete opacification of the right hemithorax. 2.  Left lung nodularity as before.   Original Report Authenticated By: Signa Kell, M.D.    Scheduled Meds: . antiseptic oral rinse  15 mL Mouth Rinse q12n4p  . chlorhexidine  15 mL Mouth Rinse BID  . Chlorhexidine Gluconate Cloth  6 each Topical Q0600  . dexamethasone  4 mg Oral Daily  . influenza vac split quadrivalent PF  0.5 mL Intramuscular Once  . morphine  30 mg Oral Q8H  . mupirocin ointment  1 application Nasal BID  . piperacillin-tazobactam (ZOSYN)  IV  3.375 g Intravenous Q8H  . sodium chloride  3 mL Intravenous Q12H  . vancomycin  750 mg Intravenous Q12H   Continuous Infusions:   Time spent: 35 minutes. Patient is critically ill and requires complex decision-making.   LOS: 1 day   Muhanad Torosyan  [facility] Pager [phone].   *Please note that the hospitalists switch teams on Wednesdays. Please call the flow manager at [phone] if you are having difficulty reaching the hospitalist taking care of this  patient as she can update you and provide the most up-to-date pager number of provider caring for the patient. If 8PM-8AM, please contact night-coverage at [email or web address], password [facility]  06/13/2013, 7:16 AM

## 2013-06-13 NOTE — Consult Note (Signed)
PULMONARY  / CRITICAL CARE MEDICINE  Name: Makayla [name] MRN: [id number] DOB: 1950/12/01    ADMISSION DATE:  06/12/2013 CONSULTATION DATE:  06/12/2013  REFERRING MD :  Jeraldine Loots PRIMARY SERVICE: PCCM  CHIEF COMPLAINT:  Shortness of breath  BRIEF PATIENT DESCRIPTION: 62 y/o female with metastatic lung cancer admitted on 06/12/2013 with Acute respiratory failure due to flash pulm edema from afib/rvr likely set off by a respiratory infection.  SIGNIFICANT EVENTS:  9/10 admission, respiratory failure, BiPAP, PCCM consulted, DNR/DNI  STUDIES: 9/10 CT angio chest > no pe, tree-in-bud and consolidation in the left lung consistent with infection; complete collapse R lung, chronic R pleural effusion unchanged.  LINES / TUBES: PIV  CULTURES: 9/10 blood >>  ANTIBIOTICS: 9/10 vanc >> 9/10 zosyn >>  SUBJECTIVE:  Remains on BiPAP.  Breathing better.  Denies chest/abdominal pain.  VITAL SIGNS: Temp:  [97.6 F (36.4 C)-98.1 F (36.7 C)] 97.7 F (36.5 C) (09/11 0734) Pulse Rate:  [95-148] 108 (09/11 0900) Resp:  [19-30] 19 (09/11 0900) BP: (66-173)/(43-116) 83/57 mmHg (09/11 0900) SpO2:  [91 %-100 %] 100 % (09/11 0900) FiO2 (%):  [40 %-50 %] 40 % (09/11 0800) Weight:  [129 lb (58.514 kg)-133 lb 2.5 oz (60.4 kg)] 133 lb 2.5 oz (60.4 kg) (09/10 2240) BiPAP with 40% FiO2  INTAKE / OUTPUT: Intake/Output     09/10 0701 - 09/11 0700 09/11 0701 - 09/12 0700   I.V. (mL/kg) 315 (5.2)    IV Piggyback 1250 12.5   Total Intake(mL/kg) 1565 (25.9) 12.5 (0.2)   Urine (mL/kg/hr) 400 100 (0.6)   Total Output 400 100   Net +1165 -87.5        Stool Occurrence 1 x 1 x     PHYSICAL EXAMINATION:  Gen: no distress HEENT: BIPAP mask in place PULM: decreased breath sounds on Rt, scattered rhonchi, no wheeze CV: Tachy, regular, no murmur AB: BS+, soft, nontender Ext: warm, no edema Neuro: Alert, follows commands, normal strength  LABS:  CBC Recent Labs     06/12/13  1525  WBC  13.9*  HGB   13.1  HCT  37.2  PLT  185   Coag's No results found for this basename: APTT, INR,  in the last 72 hours  BMET Recent Labs     06/12/13  1525  NA  123*  K  3.6  CL  89*  CO2  25  BUN  26*  CREATININE  0.57  GLUCOSE  134*   Electrolytes Recent Labs     06/12/13  1525  CALCIUM  8.8   Sepsis Markers No results found for this basename: LACTICACIDVEN, PROCALCITON, O2SATVEN,  in the last 72 hours  ABG No results found for this basename: PHART, PCO2ART, PO2ART,  in the last 72 hours  Liver Enzymes Recent Labs     06/12/13  1525  AST  12  ALT  20  ALKPHOS  100  BILITOT  0.6  ALBUMIN  2.9*   Cardiac Enzymes No results found for this basename: TROPONINI, PROBNP,  in the last 72 hours  Glucose No results found for this basename: GLUCAP,  in the last 72 hours  Imaging Dg Chest 2 View  06/12/2013   *RADIOLOGY REPORT*  Clinical Data: Shortness of breath.  History of right lung cancer.  CHEST - 2 VIEW  Comparison: Chest CT 06/12/2013  Findings: Persistent volume loss and right lung opacity identified. Nodular opacities are identified at the left lung base, better seen on CT of  the same day.  There is gaseous distension of the stomach.  IMPRESSION:  1.  Stable opacity/volume loss in the right lung. 2.  Nodularity in the left lung base, better seen on CT of the same day.  This represents a change since prior studies.   Original Report Authenticated By: Norva Pavlov, M.D.   Ct Angio Chest Pe W/cm &/or Wo Cm  06/12/2013   *RADIOLOGY REPORT*  Clinical Data: History of COPD and lung cancer with osseous metastatic disease, now with shortness of breath, evaluate for pulmonary embolism  CT ANGIOGRAPHY CHEST  Technique:  Multidetector CT imaging of the chest using the standard protocol during bolus administration of intravenous contrast. Multiplanar reconstructed images including MIPs were obtained and reviewed to evaluate the vascular anatomy.  Contrast: OMNIPAQUE IOHEXOL 350  MG/ML SOLN  Comparison: CT of the chest, abdomen and pelvis - 05/13/2013; chest CT - 08/24/2012; 03/09/2012  Findings:  There is adequate opacification of the pulmonary arterial system of the main pulmonary artery measuring 677 HU. There is expected rapid tapering of the near occlusion of the right main pulmonary artery due to chronic complete atelectasis/collapse of the right lung. There are no discrete filling defects within the pulmonary arterial tree to suggest pulmonary embolism.  The pulmonary artery remains enlarged measuring 3.4 cm in diameter.  Normal heart size.  No definite pericardial effusion.  Normal caliber thoracic aorta.  Possible bovine configuration of the aortic arch.  No definite thoracic aortic dissection or periaortic stranding.  ---------------------------------------------------------  Nonvascular findings:  There is persistent findings of complete atelectasis/collapse of the remaining right lung.  The previously described mass within the atelectatic remaining right lung is difficult to definitively measure on the present examination due to associated volume loss and heterogeneous enhancement of the remaining pulmonary parenchyma.  There is no discrete enhancing nodularity of the right sided pleural. Grossly unchanged appearance of approximately 0.6 cm left-sided distal periaortic lymph node (image 51, series five). No new mediastinal or contralateral left hilar lymphadenopathy.  There is been interval development of debris within the residua of the right mainstem bronchus (images 29 - 31, series eight).  This finding is associated with development of ill-defined nodular opacities within the aerated left lung, most conspicuously within the left lower lobe many of which demonstrate a tree in bud configuration (representative images 39 and 43, series eight). While no discrete filling defects are seen within the left main stem bronchi, these constellation of findings are worrisome for  aspiration.  Early arterial phase evaluation of the superior aspect of the abdomen suggests a possible small hiatal hernia.  Redemonstrated extensive osseous metastatic disease to the thoracic spine again most conspicuously involving the C5 and T2 vertebral bodies as well as the right pedicle of T10 as well as the imaged proximal aspect of the left humerus.  Grossly unchanged severe compression deformity involving the posterior aspect of the T8 vertebral body.  IMPRESSION:  1.  No evidence of pulmonary embolism. 2.  Grossly unchanged complete atelectasis/collapse of the right lower lobe.  Previously identified heterogeneously enhancing right- sided lung mass is not well demonstrated on the present examination secondary to the adjacent atelectatic lung.  3.  Interval development of a large amount of debris within the residua of the right mainstem bronchus with associated development of ill-defined nodular tree in bud opacities primarily within the contralateral left lower lobe worrisome for aspiration.  4.  Grossly unchanged osseous metastatic disease to the chest as detailed above.  5.  Chronic  enlargement of the caliber of the main pulmonary artery which is nonspecific in the setting of complete collapse/atelectasis in the right lung but could be indicative of pulmonary arterial hypertension.   Original Report Authenticated By: Tacey Ruiz, MD   Dg Chest Portable 1 View  06/12/2013   *RADIOLOGY REPORT*  Clinical Data: Weakness.  Lung cancer  PORTABLE CHEST - 1 VIEW  Comparison: 06/12/2013  Findings: There is a large right pleural effusion.  This is similar in volume to the previous exam.  There is complete opacification of the right hemithorax.  Nodularity in the left lower lobe is unchanged.  Diffuse bronchitic changes are identified.  There is a sclerotic metastasis identified within the proximal left humerus.  IMPRESSION:  1.  No change and large right pleural effusion with complete opacification of the  right hemithorax. 2.  Left lung nodularity as before.   Original Report Authenticated By: Signa Kell, M.D.     ASSESSMENT / PLAN:  PULMONARY A:  Acute hypoxemic respiratory failure > due to HCAP and complicated by likely flash pulm edema in setting of afib/rvr. Reported hx of COPD >> unsure of dx. DNR/DNI. P:   -oxygen to keep SpO2 > 92% -f/u CXR 9/12 -BiPAP prn -goal even to negative fluid balance >> hold additional diuresis for now  CARDIOVASCULAR A:  Afib with RVR >> now in sinus rhythm. 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 P:   -monitor renal fx, urine outpt, electrolytes  GASTROINTESTINAL A:  Nutrition. P:   -resume regular diet 9/11  HEMATOLOGIC/ONCOLOGY A:  Metastatic Lung Adeno refractory to therapy, brain mets, diagnosed 2010 P:  -will need outpt f/u with oncology  INFECTIOUS A:  HCAP P:   -Day 2/x vancomycin, zosyn  ENDOCRINE A:  No acute issues P:   -monitor glucose on BMET  NEUROLOGIC A: Cancer pain. Brain metastatic lesions. P:   -continue decadron -scheduled morphine -prn xanax, dilaudid  Updated family at bedside.  Okay to change to SDU status >> defer to primary team.  Coralyn Helling, MD [facility] Pulmonary/Critical Care 06/13/2013, 10:07 AM Pager:  [phone] After 3pm call: [phone]

## 2013-06-13 NOTE — Progress Notes (Signed)
Pt placed on [place] 4 L. Sats maintained at 99%, however, pt called out for RN at 1220 with c/o "congestion" in chest and requested Bipap be put back on. No distress noted. Bipap 40% applied.  Offered pt a break from Bipap but she refused. Pt resting comfortably in bed with sons at her side.

## 2013-06-13 NOTE — Progress Notes (Signed)
[id number]/Rhonda Davis, RN, BSN, CCM [phone] Chart Reviewed for discharge and hospital needs. Discharge needs at time of review:  None Review of patient progress due on [id number]. 

## 2013-06-13 NOTE — Progress Notes (Signed)
Error

## 2013-06-13 NOTE — Progress Notes (Signed)
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  PRIOR THERAPY:  1. Status post palliative radiotherapy to the right hilum as well as anterior inferior ribs and lumbar spine from L2-L5 under the care Dr. Shon Baton.  2. status post right hip prophylactic trochanteric femoral nail and under the care of Dr. Eulas Post  3. palliative radiotherapy to his lumbar spine under the care of Dr.Wentworth  4. Tarceva at 150 mg by mouth daily status post 37 months of treatment.  5. Gilotrif 40 mg by mouth daily. Therapy started 12/17/2012, discontinued on 05/15/2013 secondary to disease progression   CURRENT THERAPY:  1. Whole brain irradiation under the care of Dr. Michell Heinrich.  2. Xgeva 120 mg subcutaneously given on a monthly basis for bone metastasis status post 16 treatments   CHEMOTHERAPY INTENT: Palliative.  CURRENT # OF CHEMOTHERAPY CYCLES: 0  CURRENT ANTIEMETICS: Compazine  CURRENT SMOKING STATUS: Never smoker  ORAL CHEMOTHERAPY AND CONSENT: None  CURRENT BISPHOSPHONATES USE: Xgeva  PAIN MANAGEMENT: MS Contin and Vicodin  NARCOTICS INDUCED CONSTIPATION: Over the counter stool softener  LIVING WILL AND CODE STATUS: ?   Subjective: The patient is seen and examined today. She has metastatic non-small cell lung cancer, adenocarcinoma diagnosed in December of 2010 with positive EGFR mutation in exon 21. She has been on treatment with Tarceva followed by Gilotrif for around 42 months. Unfortunately the patient continues to have evidence for disease progression with new brain metastasis. She completed a course of whole brain irradiation. A biopsy of the left supraclavicular lymph node [facility] the presence of the EGFR mutation in exon 21 in addition to the resistant mutation T790M. the patient  is currently under evaluation for a clinical trial at the [facility] in [place] for a new medication AZD [id number] which overcomes the T790M resistant mutation. Unfortunately she was admitted yesterday with shortness of breath secondary to questionable aspiration and infection of the left lung in addition to atrial fibrillation. She has chronic complete collapse of the right lung. The patient is currently on BiPAP. She is feeling a little bit better today but continues to be in a critical condition. CT angiogram of the chest showed no evidence for pulmonary embolism but questionable inflammation of the left lung.  Objective: Vital signs in last 24 hours: Temp:  [97.3 F (36.3 C)-97.9 F (36.6 C)] 97.3 F (36.3 C) (09/11 1600) Pulse Rate:  [95-121] 116 (09/11 1600) Resp:  [17-30] 26 (09/11 1600) BP: (66-127)/(43-105) 117/95 mmHg (09/11 1600) SpO2:  [91 %-100 %] 100 % (09/11 1600) FiO2 (%):  [40 %-50 %] 40 % (09/11 1600) Weight:  [129 lb (58.514 kg)-133 lb 2.5 oz (60.4 kg)] 133 lb 2.5 oz (60.4 kg) (09/10 2240)  Intake/Output from previous day: 09/10 0701 - 09/11 0700 In: 1565 [I.V.:315; IV Piggyback:1250] Out: 400 [Urine:400] Intake/Output this shift: Total I/O In: 355 [P.O.:130; IV Piggyback:225] Out: 300 [Urine:300]  General appearance: alert, cooperative, fatigued and moderate distress Resp: diminished breath sounds RLL, RML and RUL and dullness to percussion RLL, RML and RUL Cardio: regular rate and rhythm, S1, S2 normal, no murmur, click, rub or gallop GI: soft, non-tender; bowel sounds normal; no masses,  no organomegaly Extremities: extremities normal, atraumatic, no cyanosis or edema  Lab Results:   Recent Labs  06/12/13 1525  WBC 13.9*  HGB 13.1  HCT 37.2  PLT 185   BMET  Recent Labs  06/12/13 1525  NA 123*  K 3.6  CL 89*  CO2 25  GLUCOSE 134*  BUN 26*  CREATININE 0.57  CALCIUM 8.8    Studies/Results: Dg Chest 2 View  06/12/2013   *RADIOLOGY  REPORT*  Clinical Data: Shortness of breath.  History of right lung cancer.  CHEST - 2 VIEW  Comparison: Chest CT 06/12/2013  Findings: Persistent volume loss and right lung opacity identified. Nodular opacities are identified at the left lung base, better seen on CT of the same day.  There is gaseous distension of the stomach.  IMPRESSION:  1.  Stable opacity/volume loss in the right lung. 2.  Nodularity in the left lung base, better seen on CT of the same day.  This represents a change since prior studies.   Original Report Authenticated By: Norva Pavlov, M.D.   Ct Angio Chest Pe W/cm &/or Wo Cm  06/12/2013   *RADIOLOGY REPORT*  Clinical Data: History of COPD and lung cancer with osseous metastatic disease, now with shortness of breath, evaluate for pulmonary embolism  CT ANGIOGRAPHY CHEST  Technique:  Multidetector CT imaging of the chest using the standard protocol during bolus administration of intravenous contrast. Multiplanar reconstructed images including MIPs were obtained and reviewed to evaluate the vascular anatomy.  Contrast: OMNIPAQUE IOHEXOL 350 MG/ML SOLN  Comparison: CT of the chest, abdomen and pelvis - 05/13/2013; chest CT - 08/24/2012; 03/09/2012  Findings:  There is adequate opacification of the pulmonary arterial system of the main pulmonary artery measuring 677 HU. There is expected rapid tapering of the near occlusion of the right main pulmonary artery due to chronic complete atelectasis/collapse of the right lung. There are no discrete filling defects within the pulmonary arterial tree to suggest pulmonary embolism.  The pulmonary artery remains enlarged measuring 3.4 cm in diameter.  Normal heart size.  No definite pericardial effusion.  Normal caliber thoracic aorta.  Possible bovine configuration of the aortic arch.  No definite thoracic aortic dissection or periaortic stranding.  ---------------------------------------------------------  Nonvascular findings:  There is  persistent findings of complete atelectasis/collapse of the remaining right lung.  The previously described mass within the atelectatic remaining right lung is difficult to definitively measure on the present examination due to associated volume loss and heterogeneous enhancement of the remaining pulmonary parenchyma.  There is no discrete enhancing nodularity of the right sided pleural. Grossly unchanged appearance of approximately 0.6 cm left-sided distal periaortic lymph node (image 51, series five). No new mediastinal or contralateral left hilar lymphadenopathy.  There is been interval development of debris within the residua of the right mainstem bronchus (images 29 - 31, series eight).  This finding is associated with development of ill-defined nodular opacities within the aerated left lung, most conspicuously within the left lower lobe many of which demonstrate a tree in bud configuration (representative images 39 and 43, series eight). While no discrete filling defects are seen within the left main stem bronchi, these constellation of findings are worrisome for aspiration.  Early arterial phase evaluation of the superior aspect of the abdomen suggests a possible small hiatal hernia.  Redemonstrated extensive osseous metastatic disease to the thoracic spine again most conspicuously involving the C5 and T2 vertebral bodies as well as the right pedicle of T10 as well as the imaged proximal aspect of the left humerus.  Grossly unchanged severe compression deformity involving the posterior aspect of the T8 vertebral body.  IMPRESSION:  1.  No evidence of pulmonary embolism. 2.  Grossly unchanged complete atelectasis/collapse of the right lower lobe.  Previously identified heterogeneously enhancing right- sided lung mass is not well demonstrated on the present examination secondary to the adjacent atelectatic lung.  3.  Interval development of a large amount of debris within the residua of the right mainstem  bronchus with associated development of ill-defined nodular tree in bud opacities primarily within the contralateral left lower lobe worrisome for aspiration.  4.  Grossly unchanged osseous metastatic disease to the chest as detailed above.  5.  Chronic enlargement of the caliber of the main pulmonary artery which is nonspecific in the setting of complete collapse/atelectasis in the right lung but could be indicative of pulmonary arterial hypertension.   Original Report Authenticated By: Tacey Ruiz, MD   Dg Chest Portable 1 View  06/12/2013   *RADIOLOGY REPORT*  Clinical Data: Weakness.  Lung cancer  PORTABLE CHEST - 1 VIEW  Comparison: 06/12/2013  Findings: There is a large right pleural effusion.  This is similar in volume to the previous exam.  There is complete opacification of the right hemithorax.  Nodularity in the left lower lobe is unchanged.  Diffuse bronchitic changes are identified.  There is a sclerotic metastasis identified within the proximal left humerus.  IMPRESSION:  1.  No change and large right pleural effusion with complete opacification of the right hemithorax. 2.  Left lung nodularity as before.   Original Report Authenticated By: Signa Kell, M.D.    Medications: I have reviewed the patient's current medications.  Assessment/Plan: This is a very pleasant 62 years old white female with metastatic non-small cell lung cancer, adenocarcinoma with positive EGFR mutation, status post 42 months of treatment with oral Tarceva followed by Gilotrif. Unfortunately the patient has evidence for disease progression recently and a repeat biopsy was molecular profiling showed the presence of a second resistant mutation T790M. the patient is currently under evaluation for enrollment in a clinical trial with [id number] which showed a response over 60% in patients with the T790M resistant mutation. She was admitted yesterday with questionable aspiration of the left lung as well as atrial fibrillation  and shortness of breath. I would recommend to continue the patient on the aggressive therapy for her current infection. Cough. She recovered from her current illness, I would refer the patient to the [facility] in [place] or  [facility] for the clinical trial. Thank you for taking good care of Makayla [name]. I will continue to follow up the patient with you and assist in her management.   LOS: 1 day    Makayla [name] K. 06/13/2013

## 2013-06-14 ENCOUNTER — Telehealth: Payer: Self-pay

## 2013-06-14 ENCOUNTER — Telehealth: Payer: Self-pay | Admitting: Medical Oncology

## 2013-06-14 ENCOUNTER — Ambulatory Visit: Payer: Managed Care, Other (non HMO)

## 2013-06-14 ENCOUNTER — Inpatient Hospital Stay (HOSPITAL_COMMUNITY): Payer: Managed Care, Other (non HMO)

## 2013-06-14 LAB — BASIC METABOLIC PANEL
CO2: 23 mEq/L (ref 19–32)
Calcium: 8.1 mg/dL — ABNORMAL LOW (ref 8.4–10.5)
Creatinine, Ser: 0.5 mg/dL (ref 0.50–1.10)
GFR calc non Af Amer: >90 mL/min (ref >90)
Sodium: 131 mEq/L — ABNORMAL LOW (ref 135–145)

## 2013-06-14 LAB — CBC
MCH: 29.5 pg (ref 26.0–34.0)
MCV: 83.9 fL (ref 78.0–100.0)
Platelets: 120 10*3/uL — ABNORMAL LOW (ref 150–400)
RBC: 4.04 MIL/uL (ref 3.87–5.11)
RDW: 15.4 % (ref 11.5–15.5)
WBC: 7 10*3/uL (ref 4.0–10.5)

## 2013-06-14 MED ORDER — BOOST / RESOURCE BREEZE PO LIQD
1.0000 | ORAL | Status: DC
  Administered 2013-06-15 – 2013-06-16 (×2): 1 via ORAL

## 2013-06-14 MED ORDER — ENSURE COMPLETE PO LIQD: 237.0000 mL | Freq: Two times a day (BID) | ORAL | Status: DC

## 2013-06-14 NOTE — Telephone Encounter (Signed)
Spoke with inpatient nurse Marylene Land, she will notify Dr.Rama to inquire if patient may come for radiation treatment today and call back.

## 2013-06-14 NOTE — Telephone Encounter (Signed)
Inpt RN called , and said pt not coming for treatment today . I called her back and said to call radiation about this pt.

## 2013-06-14 NOTE — Progress Notes (Signed)
[facility] PROGRESS NOTE  TONANTZIN MIMNAUGH YQM:[id number] DOB: 01-Oct-1951 DOA: 06/12/2013 PCP: [name], MD  Brief narrative: Makayla [name] is an 62 y.o. female with a PMH of metastatic lung cancer who was admitted on 06/12/2013 with acute respiratory failure secondary to flash pulmonary edema from atrial fibrillation with rapid ventricular response in the setting of acute pneumonia. She is being followed by critical care team.  Assessment/Plan: Principal Problem:   Acute respiratory failure with hypoxia / SOB (shortness of breath) -Secondary to HCAP and flash pulmonary edema in the setting of atrial fibrillation with rapid ventricular response. -Requiring BiPAP which we will continue as needed. -Diuresed to address flash pulmonary edema. Heart rate suboptimally controlled. Continue Cardizem drip. -Pulmonology/critical care following. -CT scan consistent with infection/possible aspiration. No evidence of pulmonary embolism. 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  Improved with IVF.   COPD UNSPECIFIED -Unclear diagnosis.   Lung cancer -Seen by [name] 06/13/2013, and he will assist with ongoing management. -Current therapy: Xgeva 120 mg subcutaneously given on a monthly basis for bone metastasis status post 16 treatments. -Recommend ongoing aggressive care with the ultimate referral to [facility] or [facility] for consideration of participation in clinical trials.   Multiple brain mets -Receiving whole brain irradiation under the care of Dr. Michell Heinrich. -Continue Decadron.   HCAP (healthcare-associated pneumonia) -Continue empiric vancomycin and Zosyn.   Atrial fibrillation with rapid ventricular response -Continue Cardizem drip for rate control.   Code Status: DNR Family Communication: No family currently at the bedside. Disposition Plan: Home versus residential hospice.   Medical Consultants:  Dr. Max Fickle,  Critical care  Other Consultants:  None.  Anti-infectives:  Vancomycin 06/12/2013--->  Zosyn 06/12/2013--->  HPI/Subjective: Makayla [name] is awake. Her breathing has improved on BiPAP therapy, but she still requires it and only had 2 hours off BiPAP yesterday. She denies pain, chest tightness, and nausea.  Objective: Filed Vitals:   06/14/13 0200 06/14/13 0355 06/14/13 0400 06/14/13 0427  BP: 116/89  110/75 110/75  Pulse: 109  25 110  Temp:   97.7 F (36.5 C)   TempSrc:   Axillary   Resp: 19  21 14   Height:      Weight:  61.3 kg (135 lb 2.3 oz)    SpO2: 100%  100% 100%    Intake/Output Summary (Last 24 hours) at 06/14/13 0718 Last data filed at 06/14/13 0400  Gross per 24 hour  Intake   1070 ml  Output    825 ml  Net    245 ml    Exam: Gen:  NAD Cardiovascular:  Regular, tachycardic Respiratory:  Lungs CTAB Gastrointestinal:  Abdomen soft, NT/ND, + BS Extremities:  Trace edema  Data Reviewed: Basic Metabolic Panel:  Recent Labs Lab 06/12/13 1525 06/14/13 0337  NA 123* 131*  K 3.6 3.6  CL 89* 98  CO2 25 23  GLUCOSE 134* 96  BUN 26* 13  CREATININE 0.57 0.50  CALCIUM 8.8 8.1*   GFR Estimated Creatinine Clearance: 63.8 ml/min (by C-G formula based on Cr of 0.5). Liver Function Tests:  Recent Labs Lab 06/12/13 1525  AST 12  ALT 20  ALKPHOS 100  BILITOT 0.6  PROT 6.0  ALBUMIN 2.9*   CBC:  Recent Labs Lab 06/12/13 1525 06/14/13 0337  WBC 13.9* 7.0  NEUTROABS 12.8*  --   HGB 13.1 11.9*  HCT 37.2 33.9*  MCV 84.2 83.9  PLT 185 120*   Microbiology  Recent Results (from the past 240 hour(s))  MRSA PCR SCREENING     Status: Abnormal   Collection Time    06/12/13 10:43 PM      Result Value Range Status   MRSA by PCR POSITIVE (*) NEGATIVE Final   Comment:            The GeneXpert MRSA Assay (FDA     approved for NASAL specimens     only), is one component of a     comprehensive MRSA colonization     surveillance program. It is not      intended to diagnose MRSA     infection nor to guide or     monitor treatment for     MRSA infections.     RESULT CALLED TO, READ BACK BY AND VERIFIED WITH:     [facility] RN @ 0127 ON 9.11.2014 BY MCREYNOLDS,B     Procedures and Diagnostic Studies:  Dg Chest 2 View 06/12/2013   *RADIOLOGY REPORT*  Clinical Data: Shortness of breath.  History of right lung cancer.  CHEST - 2 VIEW  Comparison: Chest CT 06/12/2013  Findings: Persistent volume loss and right lung opacity identified. Nodular opacities are identified at the left lung base, better seen on CT of the same day.  There is gaseous distension of the stomach.  IMPRESSION:  1.  Stable opacity/volume loss in the right lung. 2.  Nodularity in the left lung base, better seen on CT of the same day.  This represents a change since prior studies.   Original Report Authenticated By: Norva Pavlov, M.D.    Ct Angio Chest Pe W/cm &/or Wo Cm 06/12/2013   *RADIOLOGY REPORT*  Clinical Data: History of COPD and lung cancer with osseous metastatic disease, now with shortness of breath, evaluate for pulmonary embolism  CT ANGIOGRAPHY CHEST  Technique:  Multidetector CT imaging of the chest using the standard protocol during bolus administration of intravenous contrast. Multiplanar reconstructed images including MIPs were obtained and reviewed to evaluate the vascular anatomy.  Contrast: OMNIPAQUE IOHEXOL 350 MG/ML SOLN  Comparison: CT of the chest, abdomen and pelvis - 05/13/2013; chest CT - 08/24/2012; 03/09/2012  Findings:  There is adequate opacification of the pulmonary arterial system of the main pulmonary artery measuring 677 HU. There is expected rapid tapering of the near occlusion of the right main pulmonary artery due to chronic complete atelectasis/collapse of the right lung. There are no discrete filling defects within the pulmonary arterial tree to suggest pulmonary embolism.  The pulmonary artery remains enlarged measuring 3.4 cm in diameter.   Normal heart size.  No definite pericardial effusion.  Normal caliber thoracic aorta.  Possible bovine configuration of the aortic arch.  No definite thoracic aortic dissection or periaortic stranding.  ---------------------------------------------------------  Nonvascular findings:  There is persistent findings of complete atelectasis/collapse of the remaining right lung.  The previously described mass within the atelectatic remaining right lung is difficult to definitively measure on the present examination due to associated volume loss and heterogeneous enhancement of the remaining pulmonary parenchyma.  There is no discrete enhancing nodularity of the right sided pleural. Grossly unchanged appearance of approximately 0.6 cm left-sided distal periaortic lymph node (image 51, series five). No new mediastinal or contralateral left hilar lymphadenopathy.  There is been interval development of debris within the residua of the right mainstem bronchus (images 29 - 31, series eight).  This finding is associated with development of ill-defined nodular opacities within the aerated left lung,  most conspicuously within the left lower lobe many of which demonstrate a tree in bud configuration (representative images 39 and 43, series eight). While no discrete filling defects are seen within the left main stem bronchi, these constellation of findings are worrisome for aspiration.  Early arterial phase evaluation of the superior aspect of the abdomen suggests a possible small hiatal hernia.  Redemonstrated extensive osseous metastatic disease to the thoracic spine again most conspicuously involving the C5 and T2 vertebral bodies as well as the right pedicle of T10 as well as the imaged proximal aspect of the left humerus.  Grossly unchanged severe compression deformity involving the posterior aspect of the T8 vertebral body.  IMPRESSION:  1.  No evidence of pulmonary embolism. 2.  Grossly unchanged complete atelectasis/collapse  of the right lower lobe.  Previously identified heterogeneously enhancing right- sided lung mass is not well demonstrated on the present examination secondary to the adjacent atelectatic lung.  3.  Interval development of a large amount of debris within the residua of the right mainstem bronchus with associated development of ill-defined nodular tree in bud opacities primarily within the contralateral left lower lobe worrisome for aspiration.  4.  Grossly unchanged osseous metastatic disease to the chest as detailed above.  5.  Chronic enlargement of the caliber of the main pulmonary artery which is nonspecific in the setting of complete collapse/atelectasis in the right lung but could be indicative of pulmonary arterial hypertension.   Original Report Authenticated By: Tacey Ruiz, MD     Dg Chest Portable 1 View 06/12/2013   *RADIOLOGY REPORT*  Clinical Data: Weakness.  Lung cancer  PORTABLE CHEST - 1 VIEW  Comparison: 06/12/2013  Findings: There is a large right pleural effusion.  This is similar in volume to the previous exam.  There is complete opacification of the right hemithorax.  Nodularity in the left lower lobe is unchanged.  Diffuse bronchitic changes are identified.  There is a sclerotic metastasis identified within the proximal left humerus.  IMPRESSION:  1.  No change and large right pleural effusion with complete opacification of the right hemithorax. 2.  Left lung nodularity as before.   Original Report Authenticated By: Signa Kell, M.D.    Scheduled Meds: . antiseptic oral rinse  15 mL Mouth Rinse q12n4p  . chlorhexidine  15 mL Mouth Rinse BID  . Chlorhexidine Gluconate Cloth  6 each Topical Q0600  . dexamethasone  4 mg Intravenous Daily  . morphine  30 mg Oral Q8H  . mupirocin ointment  1 application Nasal BID  . piperacillin-tazobactam (ZOSYN)  IV  3.375 g Intravenous Q8H  . sodium chloride  3 mL Intravenous Q12H  . vancomycin  750 mg Intravenous Q12H   Continuous Infusions:    Time spent: 35 minutes. Patient is critically ill and requires complex decision-making.   LOS: 2 days   RAMA,CHRISTINA  [facility] Pager [phone].   *Please note that the hospitalists switch teams on Wednesdays. Please call the flow manager at [phone] if you are having difficulty reaching the hospitalist taking care of this patient as she can update you and provide the most up-to-date pager number of provider caring for the patient. If 8PM-8AM, please contact night-coverage at [email or web address], password [facility]  06/14/2013, 7:18 AM

## 2013-06-14 NOTE — Progress Notes (Addendum)
No radiation treatment today. Spoke with Dr. Michell Heinrich around 2:30 pm.    Incorrectly reported to Dr. Michell Heinrich that patient was receiving a cardizem drip, which she is not.  Called Dr. Luciano Cutter nurse and reported that the patient is not on a cardizem drip.  Did not receive a call back informing that the patient would receive radiation today.  Next radiation treatment planned for Monday, September 15 as long as patient status allows.

## 2013-06-14 NOTE — Progress Notes (Signed)
INITIAL NUTRITION ASSESSMENT  DOCUMENTATION CODES Per approved criteria  -Not Applicable   INTERVENTION: Provide Ensure Complete BID Provide Resource Breeze once daily Provide Magic Cup once daily Encourage PO intake  NUTRITION DIAGNOSIS: Inadequate oral intake related to poor appetite and sore throat as evidenced by 10% wt loss in less than 6 months.   Goal: Pt to meet >/= 90% of their estimated nutrition needs   Monitor:  PO intake Weight Labs  Reason for Assessment: Low Braden  62 y.o. female  Admitting Dx: Acute respiratory failure with hypoxia  ASSESSMENT: 62 yo female with stage 4 lung cancer comes in with sob, worsening resp distress, cough. She has had extension of her cancer despite aggressive chemo/radiation and is actually undergoing evaluation to possibly start experimental tx in [place].  Pt reports having a poor appetite and having a sore throat making it difficult to eat/swallow. Pt reports eating 3 meals daily PTA but, she has been eating much less than usual for about one month now. Pt did not eat any breakfast this morning but, she is sipping on a protein shake family brought her. Encouraged pt to order 3 meals daily and eat as tolerated; recommended choosing soft foods and to request pureed foods, extra sauce, extra gravy as needed.   Height: Ht Readings from Last 1 Encounters:  06/12/13 5\' 4"  (1.626 m)    Weight: Wt Readings from Last 1 Encounters:  06/14/13 135 lb 2.3 oz (61.3 kg)    Ideal Body Weight: 120 lbs  % Ideal Body Weight: 113%  Wt Readings from Last 10 Encounters:  06/14/13 135 lb 2.3 oz (61.3 kg)  06/11/13 129 lb 1.6 oz (58.559 kg)  06/05/13 132 lb 12.8 oz (60.238 kg)  06/04/13 132 lb 3.2 oz (59.966 kg)  05/28/13 134 lb (60.782 kg)  05/23/13 136 lb 9.6 oz (61.961 kg)  05/17/13 137 lb (62.143 kg)  05/15/13 137 lb 8 oz (62.37 kg)  04/15/13 139 lb 3.2 oz (63.141 kg)  03/13/13 141 lb 8 oz (64.184 kg)    Usual Body Weight: 150  lbs (May 2014)  % Usual Body Weight: 90%  BMI:  Body mass index is 23.19 kg/(m^2).  Estimated Nutritional Needs: Kcal: 1700-1900 Protein: 85-95 grams Fluid: 1.7-1.9 L/day  Skin: excoriated and red perineum  Diet Order: General  EDUCATION NEEDS: -No education needs identified at this time   Intake/Output Summary (Last 24 hours) at 06/14/13 1247 Last data filed at 06/14/13 1219  Gross per 24 hour  Intake    960 ml  Output    925 ml  Net     35 ml    Last BM: 9/11  Labs:   Recent Labs Lab 06/12/13 1525 06/14/13 0337  NA 123* 131*  K 3.6 3.6  CL 89* 98  CO2 25 23  BUN 26* 13  CREATININE 0.57 0.50  CALCIUM 8.8 8.1*  GLUCOSE 134* 96    CBG (last 3)  No results found for this basename: GLUCAP,  in the last 72 hours  Scheduled Meds: . antiseptic oral rinse  15 mL Mouth Rinse q12n4p  . chlorhexidine  15 mL Mouth Rinse BID  . Chlorhexidine Gluconate Cloth  6 each Topical Q0600  . dexamethasone  4 mg Intravenous Daily  . morphine  30 mg Oral Q8H  . mupirocin ointment  1 application Nasal BID  . piperacillin-tazobactam (ZOSYN)  IV  3.375 g Intravenous Q8H  . sodium chloride  3 mL Intravenous Q12H  . vancomycin  750  mg Intravenous Q12H    Continuous Infusions:   Past Medical History  Diagnosis Date  . COPD (chronic obstructive pulmonary disease)   . Hx of radiation therapy   . Hx antineoplastic chemotherapy   . Shortness of breath   . GERD (gastroesophageal reflux disease)   . Lung cancer     lung ca dx 12/10  . Lung cancer 09/01/2011    bone mets  . 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 Abdominal hysterectomy    . Surgery right leg, rod and pin      Ian Malkin RD, LDN Inpatient Clinical Dietitian Pager: [phone] After Hours Pager: [phone]

## 2013-06-14 NOTE — Progress Notes (Signed)
Bladder scan performed in am.  Patient only voiding small amounts frequently.  Bladder scan showed 961 ml. Patient unable to empty bladder.  Urinary catheter inserted.

## 2013-06-14 NOTE — Consult Note (Signed)
PULMONARY  / CRITICAL CARE MEDICINE  Name: Makayla [name] MRN: [id number] DOB: [birth year]    ADMISSION DATE:  06/12/2013 CONSULTATION DATE:  06/12/2013  REFERRING MD :  Jeraldine Loots PRIMARY SERVICE: PCCM  CHIEF COMPLAINT:  Shortness of breath  BRIEF PATIENT DESCRIPTION: 62 y/o female with metastatic lung cancer admitted on 06/12/2013 with Acute respiratory failure due to flash pulm edema from afib/rvr likely set off by a respiratory infection.  SIGNIFICANT EVENTS:  9/10 admission, respiratory failure, BiPAP, PCCM consulted, DNR/DNI 9/12 Transitioned off BiPAP  STUDIES: 9/10 CT angio chest > no pe, tree-in-bud and consolidation in the left lung consistent with infection; complete collapse R lung, chronic R pleural effusion unchanged.  LINES / TUBES: PIV  CULTURES: 9/10 blood >>  ANTIBIOTICS: 9/10 vanc >> 9/10 zosyn >>  SUBJECTIVE:  Breathing more comfortable.  Has cough with sputum.  Denies chest pain.  VITAL SIGNS: Temp:  [97.3 F (36.3 C)-97.9 F (36.6 C)] 97.8 F (36.6 C) (09/12 0800) Pulse Rate:  [25-120] 76 (09/12 0800) Resp:  [14-26] 23 (09/12 0800) BP: (85-123)/(51-95) 104/86 mmHg (09/12 0800) SpO2:  [96 %-100 %] 100 % (09/12 0427) FiO2 (%):  [40 %] 40 % (09/12 0000) Weight:  [135 lb 2.3 oz (61.3 kg)] 135 lb 2.3 oz (61.3 kg) (09/12 0355)  INTAKE / OUTPUT: Intake/Output     09/11 0701 - 09/12 0700 09/12 0701 - 09/13 0700   P.O. 70    I.V. (mL/kg) 550 (9)    IV Piggyback 450    Total Intake(mL/kg) 1070 (17.5)    Urine (mL/kg/hr) 825 (0.6) 50 (0.3)   Total Output 825 50   Net +245 -50        Stool Occurrence 4 x      PHYSICAL EXAMINATION:  Gen: no distress HEENT: crusted lesion over lip PULM: decreased breath sounds on Rt, scattered rhonchi, no wheeze CV: Tachy, regular, no murmur AB: BS+, soft, nontender Ext: warm, no edema Neuro: Alert, follows commands, normal strength  LABS:  CBC Recent Labs     06/12/13  1525  06/14/13  0337  WBC  13.9*   7.0  HGB  13.1  11.9*  HCT  37.2  33.9*  PLT  185  120*   Coag's No results found for this basename: APTT, INR,  in the last 72 hours  BMET Recent Labs     06/12/13  1525  06/14/13  0337  NA  123*  131*  K  3.6  3.6  CL  89*  98  CO2  25  23  BUN  26*  13  CREATININE  0.57  0.50  GLUCOSE  134*  96   Electrolytes Recent Labs     06/12/13  1525  06/14/13  0337  CALCIUM  8.8  8.1*   Sepsis Markers No results found for this basename: LACTICACIDVEN, PROCALCITON, O2SATVEN,  in the last 72 hours  ABG No results found for this basename: PHART, PCO2ART, PO2ART,  in the last 72 hours  Liver Enzymes Recent Labs     06/12/13  1525  AST  12  ALT  20  ALKPHOS  100  BILITOT  0.6  ALBUMIN  2.9*   Cardiac Enzymes No results found for this basename: TROPONINI, PROBNP,  in the last 72 hours  Glucose No results found for this basename: GLUCAP,  in the last 72 hours  Imaging Dg Chest 2 View  06/12/2013   *RADIOLOGY REPORT*  Clinical Data: Shortness of breath.  History  of right lung cancer.  CHEST - 2 VIEW  Comparison: Chest CT 06/12/2013  Findings: Persistent volume loss and right lung opacity identified. Nodular opacities are identified at the left lung base, better seen on CT of the same day.  There is gaseous distension of the stomach.  IMPRESSION:  1.  Stable opacity/volume loss in the right lung. 2.  Nodularity in the left lung base, better seen on CT of the same day.  This represents a change since prior studies.   Original Report Authenticated By: Norva Pavlov, M.D.   Ct Angio Chest Pe W/cm &/or Wo Cm  06/12/2013   *RADIOLOGY REPORT*  Clinical Data: History of COPD and lung cancer with osseous metastatic disease, now with shortness of breath, evaluate for pulmonary embolism  CT ANGIOGRAPHY CHEST  Technique:  Multidetector CT imaging of the chest using the standard protocol during bolus administration of intravenous contrast. Multiplanar reconstructed images including MIPs  were obtained and reviewed to evaluate the vascular anatomy.  Contrast: OMNIPAQUE IOHEXOL 350 MG/ML SOLN  Comparison: CT of the chest, abdomen and pelvis - 05/13/2013; chest CT - 08/24/2012; 03/09/2012  Findings:  There is adequate opacification of the pulmonary arterial system of the main pulmonary artery measuring 677 HU. There is expected rapid tapering of the near occlusion of the right main pulmonary artery due to chronic complete atelectasis/collapse of the right lung. There are no discrete filling defects within the pulmonary arterial tree to suggest pulmonary embolism.  The pulmonary artery remains enlarged measuring 3.4 cm in diameter.  Normal heart size.  No definite pericardial effusion.  Normal caliber thoracic aorta.  Possible bovine configuration of the aortic arch.  No definite thoracic aortic dissection or periaortic stranding.  ---------------------------------------------------------  Nonvascular findings:  There is persistent findings of complete atelectasis/collapse of the remaining right lung.  The previously described mass within the atelectatic remaining right lung is difficult to definitively measure on the present examination due to associated volume loss and heterogeneous enhancement of the remaining pulmonary parenchyma.  There is no discrete enhancing nodularity of the right sided pleural. Grossly unchanged appearance of approximately 0.6 cm left-sided distal periaortic lymph node (image 51, series five). No new mediastinal or contralateral left hilar lymphadenopathy.  There is been interval development of debris within the residua of the right mainstem bronchus (images 29 - 31, series eight).  This finding is associated with development of ill-defined nodular opacities within the aerated left lung, most conspicuously within the left lower lobe many of which demonstrate a tree in bud configuration (representative images 39 and 43, series eight). While no discrete filling defects are  seen within the left main stem bronchi, these constellation of findings are worrisome for aspiration.  Early arterial phase evaluation of the superior aspect of the abdomen suggests a possible small hiatal hernia.  Redemonstrated extensive osseous metastatic disease to the thoracic spine again most conspicuously involving the C5 and T2 vertebral bodies as well as the right pedicle of T10 as well as the imaged proximal aspect of the left humerus.  Grossly unchanged severe compression deformity involving the posterior aspect of the T8 vertebral body.  IMPRESSION:  1.  No evidence of pulmonary embolism. 2.  Grossly unchanged complete atelectasis/collapse of the right lower lobe.  Previously identified heterogeneously enhancing right- sided lung mass is not well demonstrated on the present examination secondary to the adjacent atelectatic lung.  3.  Interval development of a large amount of debris within the residua of the right mainstem bronchus  with associated development of ill-defined nodular tree in bud opacities primarily within the contralateral left lower lobe worrisome for aspiration.  4.  Grossly unchanged osseous metastatic disease to the chest as detailed above.  5.  Chronic enlargement of the caliber of the main pulmonary artery which is nonspecific in the setting of complete collapse/atelectasis in the right lung but could be indicative of pulmonary arterial hypertension.   Original Report Authenticated By: Tacey Ruiz, MD   Dg Chest Port 1 View  06/14/2013   *RADIOLOGY REPORT*  Clinical Data: Pneumonia.  History lung cancer  PORTABLE CHEST - 1 VIEW  Comparison: 06/12/2013  Findings: Complete collapse of the right lung again noted and unchanged.  There is associated right pleural effusion.  The heart shifted to the right due to collapse of the right lung, unchanged  Left lower lobe infiltrate consistent with pneumonia is slightly more prominent.  No left effusion.  IMPRESSION: Complete collapse of the  right lung with right effusion unchanged  Mild progression of left lower lobe pneumonia.   Original Report Authenticated By: Janeece Riggers, M.D.   Dg Chest Portable 1 View  06/12/2013   *RADIOLOGY REPORT*  Clinical Data: Weakness.  Lung cancer  PORTABLE CHEST - 1 VIEW  Comparison: 06/12/2013  Findings: There is a large right pleural effusion.  This is similar in volume to the previous exam.  There is complete opacification of the right hemithorax.  Nodularity in the left lower lobe is unchanged.  Diffuse bronchitic changes are identified.  There is a sclerotic metastasis identified within the proximal left humerus.  IMPRESSION:  1.  No change and large right pleural effusion with complete opacification of the right hemithorax. 2.  Left lung nodularity as before.   Original Report Authenticated By: Signa Kell, M.D.     ASSESSMENT / PLAN:  PULMONARY A:  Acute hypoxemic respiratory failure > due to HCAP and complicated by likely flash pulm edema in setting of afib/rvr. Reported hx of COPD >> unsure of dx. DNR/DNI. P:   -oxygen to keep SpO2 > 92% -f/u CXR intermittently -BiPAP prn >> try to keep off as much as tolerated -goal even fluid balance  CARDIOVASCULAR A:  Afib with RVR >> now in sinus rhythm. P:  -monitor heart rhythm  RENAL A: Hyponatremia >> improved. P:   -monitor renal fx, urine outpt, electrolytes  GASTROINTESTINAL A:  Nutrition. P:   -resumed regular diet 9/11  HEMATOLOGIC/ONCOLOGY A:  Metastatic Lung Adeno refractory to therapy, brain mets, diagnosed 2010 P:  -per oncology  INFECTIOUS A:  HCAP P:   -Day 3/x vancomycin, zosyn  ENDOCRINE A:  No acute issues P:   -monitor glucose on BMET  NEUROLOGIC A: Cancer pain. Brain metastatic lesions. P:   -continue decadron -scheduled morphine -prn xanax, dilaudid  Updated family at bedside.  Clinically improved.  Nothing additional to offer from PCCM service beyond excellent care provided by primary team.   PCCM will sign off.  Please call if additional help needed.  Coralyn Helling, MD [facility] Pulmonary/Critical Care 06/14/2013, 10:09 AM Pager:  [phone] After 3pm call: [phone]

## 2013-06-14 NOTE — Telephone Encounter (Signed)
Received call from [facility] for pt in #[phone]. She states she asked Dr Darnelle Catalan about pt receiving chemotherapy today, did not realize she was to ask about radiation. Dr Darnelle Catalan stated pt is not to receive treatment. She then called Vincent Peyer RN, med onc and was told to call rad onc dept because pt is scheduled for radiation. RN called this RN stating she feels this decision needs to come from Dr Michell Heinrich. Informed her that rad onc RN typically calls inpatient RN for report on pt. She states she would like call back. Spoke w/Faith RT, linac 2 who states "someone form the floor called and cancelled pt's treatment today". RT does not know who called from inpt floor. Informed Dr Michell Heinrich.

## 2013-06-15 LAB — VANCOMYCIN, TROUGH: Vancomycin Tr: 11.6 ug/mL (ref 10.0–20.0)

## 2013-06-15 MED ORDER — VANCOMYCIN HCL IN DEXTROSE 1-5 GM/200ML-% IV SOLN
1000.0000 mg | Freq: Two times a day (BID) | INTRAVENOUS | Status: DC
  Administered 2013-06-15: 1000 mg via INTRAVENOUS
  Filled 2013-06-15 (×3): qty 200

## 2013-06-15 MED ORDER — POLYETHYLENE GLYCOL 3350 17 G PO PACK
17.0000 g | PACK | Freq: Every day | ORAL | Status: DC | PRN
  Filled 2013-06-15 (×2): qty 1

## 2013-06-15 MED ORDER — BIOTENE DRY MOUTH MT LIQD: 15.0000 mL | Freq: Two times a day (BID) | OROMUCOSAL | Status: DC

## 2013-06-15 MED ORDER — SODIUM CHLORIDE 0.9 % IV SOLN
250.0000 mL | INTRAVENOUS | Status: DC | PRN
  Administered 2013-06-15: 13:00:00 via INTRAVENOUS

## 2013-06-15 MED ORDER — VANCOMYCIN HCL 500 MG IV SOLR
500.0000 mg | Freq: Once | INTRAVENOUS | Status: AC
  Administered 2013-06-15: 500 mg via INTRAVENOUS
  Filled 2013-06-15: qty 500

## 2013-06-15 NOTE — Progress Notes (Signed)
ANTIBIOTIC CONSULT NOTE - FOLLOW UP  Pharmacy Consult for vancomycin and Zosyn Indication: pneumonia  Allergies  Allergen Reactions  . Sulfonamide Derivatives Nausea And Vomiting    Patient Measurements: Height: 5\' 4"  (162.6 cm) Weight: 134 lb 14.7 oz (61.2 kg) IBW/kg (Calculated) : 54.7   Vital Signs: Temp: 98.3 F (36.8 C) (09/13 0800) Temp src: Oral (09/13 0800) BP: 106/75 mmHg (09/13 1000) Pulse Rate: 105 (09/13 1000) Intake/Output from previous day: 09/12 0701 - 09/13 0700 In: 830 [I.V.:360; IV Piggyback:470] Out: 2040 [Urine:2040] Intake/Output from this shift: Total I/O In: 247.5 [I.V.:60; IV Piggyback:187.5] Out: 200 [Urine:200]  Labs:  Recent Labs  06/12/13 1525 06/14/13 0337  WBC 13.9* 7.0  HGB 13.1 11.9*  PLT 185 120*  CREATININE 0.57 0.50   Estimated Creatinine Clearance: 63.8 ml/min (by C-G formula based on Cr of 0.5).  Recent Labs  06/15/13 0930  VANCOTROUGH 11.6     Microbiology: 9/10 blood x2: ngtd 9/10 mrsa pcr (+)  Anti-infectives: 9/10 >> Vancomycin >> 9/10 >> Zosyn >>  Assessment: 62 y.o. female with a PMH of metastatic lung cancer who was admitted on 06/12/2013 with acute respiratory failure secondary to flash pulmonary edema from atrial fibrillation with rapid ventricular response in the setting of acute pneumonia.  Today is D#4 vancomycin and Zosyn  Cultures are negative to date  Vancomycin trough this AM below intended trough level at 11.6 on 750mg  IV q12h  Patient is afebrile and WBC were WNL on 9/12, no new CBC  Patient is clinically improving and noted plans to transfer to floor today from ICU/SD  Renal function remains stable   Goal of Therapy:  Vancomycin trough level 15-20 mcg/ml eradication of infection  Plan:  - give vancomycin 500mg  IV x 1 today at 1200 to elevate trough level - increase vancomycin to 1g IV q12h beginning tonight at 2200 - continue Zosyn 3.375g IV q8h, each dose infused over 4 hours -  follow-up culture results, clinical course - follow-up antibiotic de-escalation and length of therapy   Thank you for the consult.  Tomi Bamberger, PharmD Clinical Pharmacist Pager: [phone] Pharmacy: [phone] 06/15/2013 11:21 AM

## 2013-06-15 NOTE — Progress Notes (Signed)
Handoff report called to Lafonda Mosses, Char[employer] fundraiser.  Family notified that the patient is transferring to room 1314.

## 2013-06-15 NOTE — Progress Notes (Addendum)
[facility] PROGRESS NOTE  Makayla [name]:[id number] DOB: 1951-08-18 DOA: 06/12/2013 PCP: [name], MD  Brief narrative: Makayla [name] is an 62 y.o. female with a PMH of metastatic lung cancer who was admitted on 06/12/2013 with acute respiratory failure secondary to flash pulmonary edema from atrial fibrillation with rapid ventricular response in the setting of acute pneumonia. She has been off BiPAP for 24 hours with plans to transfer to the floor today.  Assessment/Plan: Principal Problem:   Acute respiratory failure with hypoxia / SOB (shortness of breath) -Secondary to HCAP and flash pulmonary edema in the setting of atrial fibrillation with rapid ventricular response. -Has not needed BiPAP in 24 hours. We'll transfer to floor. -Initially treated with Cardizem drip which has been discontinued. -Diuresed to address flash pulmonary edema.  -Pulmonology/critical care initially following patient. Signed off 06/14/2013. -CT scan consistent with infection/possible aspiration. No evidence of pulmonary embolism. 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  Improved with IVF.   COPD UNSPECIFIED -Unclear diagnosis.   Lung cancer -Seen by [name] 06/13/2013, and he will assist with ongoing management. -Current therapy: Xgeva 120 mg subcutaneously given on a monthly basis for bone metastasis status post 16 treatments. -Recommend ongoing aggressive care with the ultimate referral to [facility] or [facility] for consideration of participation in clinical trials.   Multiple brain mets -Receiving whole brain irradiation under the care of Dr. Michell Heinrich. -Continue Decadron.   HCAP (healthcare-associated pneumonia) -Continue empiric vancomycin and Zosyn.   Atrial fibrillation with rapid ventricular response -Heart rate stable, off Cardizem drip.  Code Status: DNR Family Communication: 2 sons updated at the bedside. Disposition Plan: Home  when stable.   Medical Consultants:  Dr. Max Fickle, Critical care  Dr. [name], Oncology  Other Consultants:  None.  Anti-infectives:  Vancomycin 06/12/2013--->  Zosyn 06/12/2013--->  HPI/Subjective: Makayla [name] feels much better. She has been off BiPAP for over 24 hours and her respiratory status is stable. She reports a cough occasionally productive with blood tinged sputum. She is a bit constipated. No nausea or vomiting.  Objective: Filed Vitals:   06/15/13 0400 06/15/13 0500 06/15/13 0600 06/15/13 0700  BP: 124/93 120/84 110/79 117/98  Pulse: 61 103 95 104  Temp: 98.3 F (36.8 C)     TempSrc: Oral     Resp: 20 10 10 14   Height:      Weight: 61.2 kg (134 lb 14.7 oz)     SpO2: 100% 100% 100% 100%    Intake/Output Summary (Last 24 hours) at 06/15/13 8119 Last data filed at 06/15/13 0600  Gross per 24 hour  Intake    810 ml  Output   2040 ml  Net  -1230 ml    Exam: Gen:  NAD Cardiovascular:  Regular, tachycardic Respiratory:  Lungs CTAB Gastrointestinal:  Abdomen soft, NT/ND, + BS Extremities:  Trace edema  Data Reviewed: Basic Metabolic Panel:  Recent Labs Lab 06/12/13 1525 06/14/13 0337  NA 123* 131*  K 3.6 3.6  CL 89* 98  CO2 25 23  GLUCOSE 134* 96  BUN 26* 13  CREATININE 0.57 0.50  CALCIUM 8.8 8.1*   GFR Estimated Creatinine Clearance: 63.8 ml/min (by C-G formula based on Cr of 0.5). Liver Function Tests:  Recent Labs Lab 06/12/13 1525  AST 12  ALT 20  ALKPHOS 100  BILITOT 0.6  PROT 6.0  ALBUMIN 2.9*   CBC:  Recent Labs Lab 06/12/13 1525 06/14/13 1478  WBC 13.9* 7.0  NEUTROABS 12.8*  --   HGB 13.1 11.9*  HCT 37.2 33.9*  MCV 84.2 83.9  PLT 185 120*   Microbiology Recent Results (from the past 240 hour(s))  CULTURE, BLOOD (ROUTINE X 2)     Status: None   Collection Time    06/12/13  9:45 PM      Result Value Range Status   Specimen Description BLOOD RIGHT HAND   Final   Special Requests BOTTLES DRAWN  AEROBIC ONLY   Final   Culture  Setup Time     Final   Value: 06/13/2013 02:21     Performed at [facility]   Culture     Final   Value:        BLOOD CULTURE RECEIVED NO GROWTH TO DATE CULTURE WILL BE HELD FOR 5 DAYS BEFORE ISSUING A FINAL NEGATIVE REPORT     Performed at [facility]   Report Status PENDING   Incomplete  CULTURE, BLOOD (ROUTINE X 2)     Status: None   Collection Time    06/12/13 10:10 PM      Result Value Range Status   Specimen Description BLOOD RIGHT FOREARM   Final   Special Requests BOTTLES DRAWN AEROBIC ONLY 1.5ML   Final   Culture  Setup Time     Final   Value: 06/13/2013 02:21     Performed at [facility]   Culture     Final   Value:        BLOOD CULTURE RECEIVED NO GROWTH TO DATE CULTURE WILL BE HELD FOR 5 DAYS BEFORE ISSUING A FINAL NEGATIVE REPORT     Performed at [facility]   Report Status PENDING   Incomplete  MRSA PCR SCREENING     Status: Abnormal   Collection Time    06/12/13 10:43 PM      Result Value Range Status   MRSA by PCR POSITIVE (*) NEGATIVE Final   Comment:            The GeneXpert MRSA Assay (FDA     approved for NASAL specimens     only), is one component of a     comprehensive MRSA colonization     surveillance program. It is not     intended to diagnose MRSA     infection nor to guide or     monitor treatment for     MRSA infections.     RESULT CALLED TO, READ BACK BY AND VERIFIED WITH:     [facility] RN @ 0127 ON 9.11.2014 BY MCREYNOLDS,B     Procedures and Diagnostic Studies:  Dg Chest 2 View 06/12/2013   *RADIOLOGY REPORT*  Clinical Data: Shortness of breath.  History of right lung cancer.  CHEST - 2 VIEW  Comparison: Chest CT 06/12/2013  Findings: Persistent volume loss and right lung opacity identified. Nodular opacities are identified at the left lung base, better seen on CT of the same day.  There is gaseous distension of the stomach.  IMPRESSION:  1.  Stable opacity/volume loss in the  right lung. 2.  Nodularity in the left lung base, better seen on CT of the same day.  This represents a change since prior studies.   Original Report Authenticated By: Norva Pavlov, M.D.    Ct Angio Chest Pe W/cm &/or Wo Cm 06/12/2013   *RADIOLOGY REPORT*  Clinical Data: History of COPD and lung cancer with osseous metastatic disease, now  with shortness of breath, evaluate for pulmonary embolism  CT ANGIOGRAPHY CHEST  Technique:  Multidetector CT imaging of the chest using the standard protocol during bolus administration of intravenous contrast. Multiplanar reconstructed images including MIPs were obtained and reviewed to evaluate the vascular anatomy.  Contrast: OMNIPAQUE IOHEXOL 350 MG/ML SOLN  Comparison: CT of the chest, abdomen and pelvis - 05/13/2013; chest CT - 08/24/2012; 03/09/2012  Findings:  There is adequate opacification of the pulmonary arterial system of the main pulmonary artery measuring 677 HU. There is expected rapid tapering of the near occlusion of the right main pulmonary artery due to chronic complete atelectasis/collapse of the right lung. There are no discrete filling defects within the pulmonary arterial tree to suggest pulmonary embolism.  The pulmonary artery remains enlarged measuring 3.4 cm in diameter.  Normal heart size.  No definite pericardial effusion.  Normal caliber thoracic aorta.  Possible bovine configuration of the aortic arch.  No definite thoracic aortic dissection or periaortic stranding.  ---------------------------------------------------------  Nonvascular findings:  There is persistent findings of complete atelectasis/collapse of the remaining right lung.  The previously described mass within the atelectatic remaining right lung is difficult to definitively measure on the present examination due to associated volume loss and heterogeneous enhancement of the remaining pulmonary parenchyma.  There is no discrete enhancing nodularity of the right sided  pleural. Grossly unchanged appearance of approximately 0.6 cm left-sided distal periaortic lymph node (image 51, series five). No new mediastinal or contralateral left hilar lymphadenopathy.  There is been interval development of debris within the residua of the right mainstem bronchus (images 29 - 31, series eight).  This finding is associated with development of ill-defined nodular opacities within the aerated left lung, most conspicuously within the left lower lobe many of which demonstrate a tree in bud configuration (representative images 39 and 43, series eight). While no discrete filling defects are seen within the left main stem bronchi, these constellation of findings are worrisome for aspiration.  Early arterial phase evaluation of the superior aspect of the abdomen suggests a possible small hiatal hernia.  Redemonstrated extensive osseous metastatic disease to the thoracic spine again most conspicuously involving the C5 and T2 vertebral bodies as well as the right pedicle of T10 as well as the imaged proximal aspect of the left humerus.  Grossly unchanged severe compression deformity involving the posterior aspect of the T8 vertebral body.  IMPRESSION:  1.  No evidence of pulmonary embolism. 2.  Grossly unchanged complete atelectasis/collapse of the right lower lobe.  Previously identified heterogeneously enhancing right- sided lung mass is not well demonstrated on the present examination secondary to the adjacent atelectatic lung.  3.  Interval development of a large amount of debris within the residua of the right mainstem bronchus with associated development of ill-defined nodular tree in bud opacities primarily within the contralateral left lower lobe worrisome for aspiration.  4.  Grossly unchanged osseous metastatic disease to the chest as detailed above.  5.  Chronic enlargement of the caliber of the main pulmonary artery which is nonspecific in the setting of complete collapse/atelectasis in the  right lung but could be indicative of pulmonary arterial hypertension.   Original Report Authenticated By: Tacey Ruiz, MD     Dg Chest Portable 1 View 06/12/2013   *RADIOLOGY REPORT*  Clinical Data: Weakness.  Lung cancer  PORTABLE CHEST - 1 VIEW  Comparison: 06/12/2013  Findings: There is a large right pleural effusion.  This is similar in volume to  the previous exam.  There is complete opacification of the right hemithorax.  Nodularity in the left lower lobe is unchanged.  Diffuse bronchitic changes are identified.  There is a sclerotic metastasis identified within the proximal left humerus.  IMPRESSION:  1.  No change and large right pleural effusion with complete opacification of the right hemithorax. 2.  Left lung nodularity as before.   Original Report Authenticated By: Signa Kell, M.D.    Scheduled Meds: . antiseptic oral rinse  15 mL Mouth Rinse q12n4p  . chlorhexidine  15 mL Mouth Rinse BID  . Chlorhexidine Gluconate Cloth  6 each Topical Q0600  . dexamethasone  4 mg Intravenous Daily  . feeding supplement  237 mL Oral BID  . feeding supplement  1 Container Oral Q24H  . morphine  30 mg Oral Q8H  . mupirocin ointment  1 application Nasal BID  . piperacillin-tazobactam (ZOSYN)  IV  3.375 g Intravenous Q8H  . sodium chloride  3 mL Intravenous Q12H  . vancomycin  750 mg Intravenous Q12H   Continuous Infusions:   Time spent: 35 minutes with > 50% of time discussing current diagnostic test results, clinical impression and plan of care with the patient and her 2 sons.    LOS: 3 days   Britzy Graul  [facility] Pager [phone].   *Please note that the hospitalists switch teams on Wednesdays. Please call the flow manager at [phone] if you are having difficulty reaching the hospitalist taking care of this patient as she can update you and provide the most up-to-date pager number of provider caring for the patient. If 8PM-8AM, please contact night-coverage at [email or web address],  password [facility]  06/15/2013, 7:22 AM

## 2013-06-15 NOTE — Progress Notes (Signed)
Patient's appetite is very poor.  9 -12-14 patient ate 3 bites of food and a few sips of liquid throughout twelve hours.  Patient refused ensure.  Dietitian visited the patient 06-14-13.  Notified Dr. Darnelle Catalan of patient's nutritional status.  Order for dietitian received.

## 2013-06-15 NOTE — Progress Notes (Signed)
Discuss benefits and risk of PICC placement.  Pt. Wishes to think about it and discuss with her sons.  Will check back later.

## 2013-06-16 DIAGNOSIS — E876 Hypokalemia: Secondary | ICD-10-CM | POA: Diagnosis not present

## 2013-06-16 LAB — BASIC METABOLIC PANEL
Calcium: 7.8 mg/dL — ABNORMAL LOW (ref 8.4–10.5)
GFR calc Af Amer: 89 mL/min — ABNORMAL LOW (ref >90)
GFR calc non Af Amer: 77 mL/min — ABNORMAL LOW (ref >90)
Potassium: 3.1 mEq/L — ABNORMAL LOW (ref 3.5–5.1)
Sodium: 134 mEq/L — ABNORMAL LOW (ref 135–145)

## 2013-06-16 LAB — CBC
MCH: 29.4 pg (ref 26.0–34.0)
MCHC: 34.4 g/dL (ref 30.0–36.0)
Platelets: 116 10*3/uL — ABNORMAL LOW (ref 150–400)

## 2013-06-16 MED ORDER — ACYCLOVIR 5 % EX OINT
TOPICAL_OINTMENT | CUTANEOUS | Status: DC
  Administered 2013-06-16 – 2013-06-18 (×11): via TOPICAL
  Administered 2013-06-18: 1 via TOPICAL
  Administered 2013-06-18 – 2013-06-21 (×8): via TOPICAL
  Administered 2013-06-21: 1 via TOPICAL
  Administered 2013-06-21 – 2013-06-27 (×37): via TOPICAL
  Filled 2013-06-16 (×2): qty 30

## 2013-06-16 MED ORDER — POTASSIUM CHLORIDE CRYS ER 20 MEQ PO TBCR
20.0000 meq | EXTENDED_RELEASE_TABLET | Freq: Three times a day (TID) | ORAL | Status: AC
  Administered 2013-06-16 (×3): 20 meq via ORAL
  Filled 2013-06-16 (×3): qty 1

## 2013-06-16 MED ORDER — LEVOFLOXACIN 750 MG PO TABS
750.0000 mg | ORAL_TABLET | Freq: Every day | ORAL | Status: DC
  Administered 2013-06-16 – 2013-06-17 (×2): 750 mg via ORAL
  Filled 2013-06-16 (×3): qty 1

## 2013-06-16 MED ORDER — DOXYCYCLINE HYCLATE 100 MG PO TABS
100.0000 mg | ORAL_TABLET | Freq: Two times a day (BID) | ORAL | Status: DC
  Administered 2013-06-16 – 2013-06-18 (×5): 100 mg via ORAL
  Filled 2013-06-16 (×10): qty 1

## 2013-06-16 MED ORDER — BOOST / RESOURCE BREEZE PO LIQD
1.0000 | Freq: Three times a day (TID) | ORAL | Status: DC
  Administered 2013-06-16 – 2013-06-23 (×7): 1 via ORAL

## 2013-06-16 NOTE — Evaluation (Signed)
Physical Therapy Evaluation Patient Details Name: Makayla [name] MRN: [id number] DOB: 16-Dec-1950 Today's Date: 06/16/2013 Time: 5621-3086 PT Time Calculation (min): 22 min  PT Assessment / Plan / Recommendation History of Present Illness    Makayla [name] is an 62 y.o. female with a PMH of metastatic lung cancer who was admitted on 06/12/2013 with acute respiratory failure secondary to flash pulmonary edema from atrial fibrillation with rapid ventricular response in the setting of acute pneumonia. She has been stable off BiPAP for 48 hours with plans to discharge home 06/17/2013 if she remains stable.      Clinical Impression  Pt presents with generalized weakness, lack of endurance and mild ambulatory balance deficits following 5 days in bed.  Pt should progress to d/c home with 24/7 assist of son and would benefit from follow up short term HHPT    PT Assessment  Patient needs continued PT services    Follow Up Recommendations  Home health PT    Does the patient have the potential to tolerate intense rehabilitation      Barriers to Discharge        Equipment Recommendations  None recommended by PT    Recommendations for Other Services OT consult   Frequency 7X/week    Precautions / Restrictions Precautions Precautions: Fall;Other (comment) Precaution Comments: SOB with min exertion 2* Lung removal (per son) Restrictions Weight Bearing Restrictions: No   Pertinent Vitals/Pain No specific pain complaints at this time      Mobility  Bed Mobility Bed Mobility: Supine to Sit Supine to Sit: 5: Supervision Transfers Transfers: Sit to Stand;Stand to Sit Sit to Stand: 4: Min assist Stand to Sit: 4: Min assist Details for Transfer Assistance: cues for transition position and use of UEs to self assist Ambulation/Gait Ambulation/Gait Assistance: 4: Min assist Ambulation Distance (Feet): 21 Feet Assistive device: Rolling walker Ambulation/Gait Assistance Details: cues for  posture and position from RW Gait Pattern: Step-to pattern;Step-through pattern;Trunk flexed General Gait Details: ltd by fatigue and elevated HR (135) Stairs: No    Exercises     PT Diagnosis: Difficulty walking  PT Problem List: Decreased activity tolerance;Decreased balance;Decreased mobility;Decreased knowledge of use of DME PT Treatment Interventions: DME instruction;Gait training;Functional mobility training;Therapeutic activities;Therapeutic exercise;Balance training;Patient/family education     PT Goals(Current goals can be found in the care plan section) Acute Rehab PT Goals Patient Stated Goal: Resume previous lifestyle at home with son PT Goal Formulation: With patient Time For Goal Achievement: 06/30/13 Potential to Achieve Goals: Good  Visit Information  Last PT Received On: 06/16/13 Assistance Needed: +1       Prior Functioning  Home Living Family/patient expects to be discharged to:: Private residence Living Arrangements: Children Available Help at Discharge: Family;Available 24 hours/day Type of Home: House Home Access: Level entry Home Layout: Able to live on main level with bedroom/bathroom Home Equipment: Walker - 2 wheels;Bedside commode Prior Function Level of Independence: Independent Communication Communication: No difficulties Dominant Hand: Right    Cognition  Cognition Arousal/Alertness: Awake/alert Behavior During Therapy: WFL for tasks assessed/performed Overall Cognitive Status: Within Functional Limits for tasks assessed    Extremity/Trunk Assessment Upper Extremity Assessment Upper Extremity Assessment: Overall WFL for tasks assessed Lower Extremity Assessment Lower Extremity Assessment: Overall WFL for tasks assessed   Balance    End of Session PT - End of Session Equipment Utilized During Treatment: Gait belt Activity Tolerance: Patient limited by fatigue;Other (comment) (HR elevated 135 with activity) Patient left: in chair;with  call bell/phone  within reach;with family/visitor present;with nursing/sitter in room Nurse Communication: Mobility status  GP     Zuriel Roskos 06/16/2013, 5:03 PM

## 2013-06-16 NOTE — Progress Notes (Signed)
RN called into room by patient with complaints of feeling dizzy. VS 97.7 106 18 92/70 O2 sats 97% on room air.  MD notified. NO new orders at this time. Will continue to monitor.

## 2013-06-16 NOTE — Progress Notes (Signed)
Patient's foley removed at 1200 today, no urine output since removal. Bladder scan performed, 153 cc of urine shown in bladder.  Patient assisted to bedside commode to assist with voiding.  Patient unable to void.  On call MD notified. No interventions at this time, plan to re-scan in two hours and follow up with MD.

## 2013-06-16 NOTE — Progress Notes (Signed)
NUTRITION FOLLOW UP  Intervention:   - Resource Breeze po TID, each supplement provides 250 kcal and 9 grams of protein. - Continue Magic Cup once daily - d/c Ensure Complete BID - Encourage PO intake - Continue to encourage small, frequent, high-calorie, high-protein meals.    Nutrition Dx:   Inadequate oral intake related to poor appetite and sore throat as evidenced by 10% wt loss in less than 6 months; ongoing  Goal:   Pt to meet >/= 90% of their estimated nutrition needs; not met  Monitor:   Po intake, weight, labs  Assessment:   Pt with stage IV lung cancer. Pt's son reported that pt's intake has been improving, but is still not where it should be. Pt was eating breakfast during my visit and had eaten about 50%.  Per son and pt, pt does not like ensure complete and is not drinking it, but she really likes [employer] and would like to have it sent more often. Informed pt's son on where he could get Resource Breeze once pt is discharged. Continued to encourage po intake. Continued to encourage small, frequent, high-calorie, high-protein meals.  Height: Ht Readings from Last 1 Encounters:  06/15/13 5\' 3"  (1.6 m)    Weight Status:   Wt Readings from Last 1 Encounters:  06/15/13 128 lb 1.4 oz (58.1 kg)    Re-estimated needs:  Kcal: 1750-1850 Protein: 70-80 g Fluid: 1.8-1.9 L  Skin: excoriated and red perineum  Diet Order: General   Intake/Output Summary (Last 24 hours) at 06/16/13 1049 Last data filed at 06/16/13 0414  Gross per 24 hour  Intake    503 ml  Output   1500 ml  Net   -997 ml    Last BM: none recorded   Labs:   Recent Labs Lab 06/12/13 1525 06/14/13 0337 06/16/13 0418  NA 123* 131* 134*  K 3.6 3.6 3.1*  CL 89* 98 98  CO2 25 23 25   BUN 26* 13 17  CREATININE 0.57 0.50 0.81  CALCIUM 8.8 8.1* 7.8*  GLUCOSE 134* 96 112*    CBG (last 3)  No results found for this basename: GLUCAP,  in the last 72 hours  Scheduled Meds: . acyclovir  ointment   Topical Q4H  . antiseptic oral rinse  15 mL Mouth Rinse q12n4p  . chlorhexidine  15 mL Mouth Rinse BID  . Chlorhexidine Gluconate Cloth  6 each Topical Q0600  . dexamethasone  4 mg Intravenous Daily  . feeding supplement  237 mL Oral BID  . feeding supplement  1 Container Oral Q24H  . morphine  30 mg Oral Q8H  . mupirocin ointment  1 application Nasal BID  . piperacillin-tazobactam (ZOSYN)  IV  3.375 g Intravenous Q8H  . sodium chloride  3 mL Intravenous Q12H  . vancomycin  1,000 mg Intravenous Q12H    Continuous Infusions:   Ebbie Latus RD, LDN

## 2013-06-16 NOTE — Progress Notes (Signed)
Dizziness resolved, BP 105/81 Pulse 109. Will continue to monitor.

## 2013-06-16 NOTE — Progress Notes (Signed)
Patient ambulated on room air. Room air sat 100%.

## 2013-06-16 NOTE — Progress Notes (Signed)
Patient up to bedside commode, but unable to void (she has not voided since foley removed at 12 noon today). She did have a small bm at this time. Pt scanned with bladder scan which revealed 280 cc's  In bladder. Hosptialist on call made aware. No orders received at this time, will attempt to have patient void in several more hours and bladder scan again if needed. Patient aware of plan.

## 2013-06-16 NOTE — Progress Notes (Signed)
[facility] PROGRESS NOTE  Makayla [name]:[id number] DOB: 1951/07/22 DOA: 06/12/2013 PCP: [name], MD  Brief narrative: Makayla [name] is an 62 y.o. female with a PMH of metastatic lung cancer who was admitted on 06/12/2013 with acute respiratory failure secondary to flash pulmonary edema from atrial fibrillation with rapid ventricular response in the setting of acute pneumonia. She has been stable off BiPAP for 48 hours with plans to discharge home 06/17/2013 if she remains stable.  Assessment/Plan: Principal Problem:   Acute respiratory failure with hypoxia / SOB (shortness of breath) -Secondary to HCAP and flash pulmonary edema in the setting of atrial fibrillation with rapid ventricular response. Currently on vancomycin/Zosyn. Switch to oral doxycycline/Levaquin. -Initially treated with BiPAP, but no BiPAP needed x48 hours. -Initially treated with Cardizem drip which has been discontinued. -Diuresed to address flash pulmonary edema.  -Pulmonology/critical care initially following patient. Signed off 06/14/2013. -CT scan consistent with infection/possible aspiration. No evidence of pulmonary embolism. -Mobilize for possible discharge home 06/17/2013. Check pulse oximetry with activity and set up home oxygen if indicated. Active Problems:   Hypokalemia -Replete.   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  Improved with IVF.   COPD UNSPECIFIED -Unclear diagnosis.   Lung cancer -Seen by [name] 06/13/2013, and he will assist with ongoing management. -Current therapy: Xgeva 120 mg subcutaneously given on a monthly basis for bone metastasis status post 16 treatments. -Recommend ongoing aggressive care with the ultimate referral to [facility] or [facility] for consideration of participation in clinical trials.   Multiple brain mets -Receiving whole brain irradiation under the care of Dr. Michell Heinrich. -Continue Decadron.   HCAP (healthcare-associated  pneumonia) -Treated with vancomycin and Zosyn. Switch to oral doxycycline/Levaquin in anticipation of discharge home tomorrow.   Atrial fibrillation with rapid ventricular response -Heart rate stable, off Cardizem drip.  Code Status: DNR Family Communication: 2 sons updated at the bedside. Disposition Plan: Home when stable.   Medical Consultants:  Dr. Max Fickle, Critical care  Dr. [name], Oncology  Other Consultants:  None.  Anti-infectives:  Vancomycin 06/12/2013--->  Zosyn 06/12/2013--->  HPI/Subjective: Makayla [name] feels much better. Appetite is fair. Cough improved with clearing of the sputum.  Objective: Filed Vitals:   06/15/13 1407 06/15/13 1443 06/15/13 2103 06/16/13 0406  BP:  120/83 113/83 115/77  Pulse:  112 105 121  Temp:  98.1 F (36.7 C) 98.8 F (37.1 C) 98.9 F (37.2 C)  TempSrc:  Oral Oral Oral  Resp:  18 16 19   Height:      Weight:      SpO2: 100% 97% 100% 99%    Intake/Output Summary (Last 24 hours) at 06/16/13 0801 Last data filed at 06/16/13 0414  Gross per 24 hour  Intake    718 ml  Output   1700 ml  Net   -982 ml    Exam: Gen:  NAD Cardiovascular:  Regular, tachycardic Respiratory:  Lungs CTAB Gastrointestinal:  Abdomen soft, NT/ND, + BS Extremities:  Trace edema  Data Reviewed: Basic Metabolic Panel:  Recent Labs Lab 06/12/13 1525 06/14/13 0337 06/16/13 0418  NA 123* 131* 134*  K 3.6 3.6 3.1*  CL 89* 98 98  CO2 25 23 25   GLUCOSE 134* 96 112*  BUN 26* 13 17  CREATININE 0.57 0.50 0.81  CALCIUM 8.8 8.1* 7.8*   GFR Estimated Creatinine Clearance: 60.3 ml/min (by C-G formula based on Cr of 0.81). Liver Function Tests:  Recent Labs Lab 06/12/13 1525  AST 12  ALT 20  ALKPHOS 100  BILITOT 0.6  PROT 6.0  ALBUMIN 2.9*   CBC:  Recent Labs Lab 06/12/13 1525 06/14/13 0337 06/16/13 0418  WBC 13.9* 7.0 8.9  NEUTROABS 12.8*  --   --   HGB 13.1 11.9* 10.8*  HCT 37.2 33.9* 31.4*  MCV 84.2 83.9  85.6  PLT 185 120* 116*   Microbiology Recent Results (from the past 240 hour(s))  CULTURE, BLOOD (ROUTINE X 2)     Status: None   Collection Time    06/12/13  9:45 PM      Result Value Range Status   Specimen Description BLOOD RIGHT HAND   Final   Special Requests BOTTLES DRAWN AEROBIC ONLY   Final   Culture  Setup Time     Final   Value: 06/13/2013 02:21     Performed at [facility]   Culture     Final   Value:        BLOOD CULTURE RECEIVED NO GROWTH TO DATE CULTURE WILL BE HELD FOR 5 DAYS BEFORE ISSUING A FINAL NEGATIVE REPORT     Performed at [facility]   Report Status PENDING   Incomplete  CULTURE, BLOOD (ROUTINE X 2)     Status: None   Collection Time    06/12/13 10:10 PM      Result Value Range Status   Specimen Description BLOOD RIGHT FOREARM   Final   Special Requests BOTTLES DRAWN AEROBIC ONLY 1.5ML   Final   Culture  Setup Time     Final   Value: 06/13/2013 02:21     Performed at [facility]   Culture     Final   Value:        BLOOD CULTURE RECEIVED NO GROWTH TO DATE CULTURE WILL BE HELD FOR 5 DAYS BEFORE ISSUING A FINAL NEGATIVE REPORT     Performed at [facility]   Report Status PENDING   Incomplete  MRSA PCR SCREENING     Status: Abnormal   Collection Time    06/12/13 10:43 PM      Result Value Range Status   MRSA by PCR POSITIVE (*) NEGATIVE Final   Comment:            The GeneXpert MRSA Assay (FDA     approved for NASAL specimens     only), is one component of a     comprehensive MRSA colonization     surveillance program. It is not     intended to diagnose MRSA     infection nor to guide or     monitor treatment for     MRSA infections.     RESULT CALLED TO, READ BACK BY AND VERIFIED WITH:     [facility] RN @ 0127 ON 9.11.2014 BY MCREYNOLDS,B     Procedures and Diagnostic Studies:  Dg Chest 2 View 06/12/2013   *RADIOLOGY REPORT*  Clinical Data: Shortness of breath.  History of right lung cancer.  CHEST - 2 VIEW   Comparison: Chest CT 06/12/2013  Findings: Persistent volume loss and right lung opacity identified. Nodular opacities are identified at the left lung base, better seen on CT of the same day.  There is gaseous distension of the stomach.  IMPRESSION:  1.  Stable opacity/volume loss in the right lung. 2.  Nodularity in the left lung base, better seen on CT of the same day.  This represents a change since prior studies.  Original Report Authenticated By: Norva Pavlov, M.D.    Ct Angio Chest Pe W/cm &/or Wo Cm 06/12/2013   *RADIOLOGY REPORT*  Clinical Data: History of COPD and lung cancer with osseous metastatic disease, now with shortness of breath, evaluate for pulmonary embolism  CT ANGIOGRAPHY CHEST  Technique:  Multidetector CT imaging of the chest using the standard protocol during bolus administration of intravenous contrast. Multiplanar reconstructed images including MIPs were obtained and reviewed to evaluate the vascular anatomy.  Contrast: OMNIPAQUE IOHEXOL 350 MG/ML SOLN  Comparison: CT of the chest, abdomen and pelvis - 05/13/2013; chest CT - 08/24/2012; 03/09/2012  Findings:  There is adequate opacification of the pulmonary arterial system of the main pulmonary artery measuring 677 HU. There is expected rapid tapering of the near occlusion of the right main pulmonary artery due to chronic complete atelectasis/collapse of the right lung. There are no discrete filling defects within the pulmonary arterial tree to suggest pulmonary embolism.  The pulmonary artery remains enlarged measuring 3.4 cm in diameter.  Normal heart size.  No definite pericardial effusion.  Normal caliber thoracic aorta.  Possible bovine configuration of the aortic arch.  No definite thoracic aortic dissection or periaortic stranding.  ---------------------------------------------------------  Nonvascular findings:  There is persistent findings of complete atelectasis/collapse of the remaining right lung.  The previously  described mass within the atelectatic remaining right lung is difficult to definitively measure on the present examination due to associated volume loss and heterogeneous enhancement of the remaining pulmonary parenchyma.  There is no discrete enhancing nodularity of the right sided pleural. Grossly unchanged appearance of approximately 0.6 cm left-sided distal periaortic lymph node (image 51, series five). No new mediastinal or contralateral left hilar lymphadenopathy.  There is been interval development of debris within the residua of the right mainstem bronchus (images 29 - 31, series eight).  This finding is associated with development of ill-defined nodular opacities within the aerated left lung, most conspicuously within the left lower lobe many of which demonstrate a tree in bud configuration (representative images 39 and 43, series eight). While no discrete filling defects are seen within the left main stem bronchi, these constellation of findings are worrisome for aspiration.  Early arterial phase evaluation of the superior aspect of the abdomen suggests a possible small hiatal hernia.  Redemonstrated extensive osseous metastatic disease to the thoracic spine again most conspicuously involving the C5 and T2 vertebral bodies as well as the right pedicle of T10 as well as the imaged proximal aspect of the left humerus.  Grossly unchanged severe compression deformity involving the posterior aspect of the T8 vertebral body.  IMPRESSION:  1.  No evidence of pulmonary embolism. 2.  Grossly unchanged complete atelectasis/collapse of the right lower lobe.  Previously identified heterogeneously enhancing right- sided lung mass is not well demonstrated on the present examination secondary to the adjacent atelectatic lung.  3.  Interval development of a large amount of debris within the residua of the right mainstem bronchus with associated development of ill-defined nodular tree in bud opacities primarily within the  contralateral left lower lobe worrisome for aspiration.  4.  Grossly unchanged osseous metastatic disease to the chest as detailed above.  5.  Chronic enlargement of the caliber of the main pulmonary artery which is nonspecific in the setting of complete collapse/atelectasis in the right lung but could be indicative of pulmonary arterial hypertension.   Original Report Authenticated By: Tacey Ruiz, MD     Dg Chest Portable 1 View  06/12/2013   *RADIOLOGY REPORT*  Clinical Data: Weakness.  Lung cancer  PORTABLE CHEST - 1 VIEW  Comparison: 06/12/2013  Findings: There is a large right pleural effusion.  This is similar in volume to the previous exam.  There is complete opacification of the right hemithorax.  Nodularity in the left lower lobe is unchanged.  Diffuse bronchitic changes are identified.  There is a sclerotic metastasis identified within the proximal left humerus.  IMPRESSION:  1.  No change and large right pleural effusion with complete opacification of the right hemithorax. 2.  Left lung nodularity as before.   Original Report Authenticated By: Signa Kell, M.D.    Scheduled Meds: . antiseptic oral rinse  15 mL Mouth Rinse q12n4p  . chlorhexidine  15 mL Mouth Rinse BID  . Chlorhexidine Gluconate Cloth  6 each Topical Q0600  . dexamethasone  4 mg Intravenous Daily  . feeding supplement  237 mL Oral BID  . feeding supplement  1 Container Oral Q24H  . morphine  30 mg Oral Q8H  . mupirocin ointment  1 application Nasal BID  . piperacillin-tazobactam (ZOSYN)  IV  3.375 g Intravenous Q8H  . sodium chloride  3 mL Intravenous Q12H  . vancomycin  1,000 mg Intravenous Q12H   Continuous Infusions:   Time spent: 25 minutes.    LOS: 4 days   RAMA,CHRISTINA  [facility] Pager [phone].   *Please note that the hospitalists switch teams on Wednesdays. Please call the flow manager at [phone] if you are having difficulty reaching the hospitalist taking care of this patient as she can  update you and provide the most up-to-date pager number of provider caring for the patient. If 8PM-8AM, please contact night-coverage at [email or web address], password [facility]  06/16/2013, 8:01 AM

## 2013-06-17 ENCOUNTER — Ambulatory Visit: Payer: Managed Care, Other (non HMO)

## 2013-06-17 LAB — BASIC METABOLIC PANEL
Chloride: 101 mEq/L (ref 96–112)
GFR calc Af Amer: 40 mL/min — ABNORMAL LOW (ref >90)
GFR calc non Af Amer: 35 mL/min — ABNORMAL LOW (ref >90)
Potassium: 3.8 mEq/L (ref 3.5–5.1)
Sodium: 135 mEq/L (ref 135–145)

## 2013-06-17 MED ORDER — SODIUM CHLORIDE 0.9 % IV SOLN: 250.0000 mL | INTRAVENOUS | Status: DC | PRN

## 2013-06-17 MED ORDER — BISACODYL 10 MG RE SUPP
10.0000 mg | Freq: Once | RECTAL | Status: AC
  Administered 2013-06-17: 10 mg via RECTAL
  Filled 2013-06-17: qty 1

## 2013-06-17 MED ORDER — BETHANECHOL CHLORIDE 25 MG PO TABS
25.0000 mg | ORAL_TABLET | Freq: Three times a day (TID) | ORAL | Status: DC
  Administered 2013-06-17 – 2013-06-18 (×4): 25 mg via ORAL
  Filled 2013-06-17 (×6): qty 1

## 2013-06-17 MED ORDER — SODIUM CHLORIDE 0.9 % IV SOLN
250.0000 mL | INTRAVENOUS | Status: DC | PRN
  Administered 2013-06-17 – 2013-06-18 (×3): 250 mL via INTRAVENOUS
  Administered 2013-06-19: 100 mL via INTRAVENOUS

## 2013-06-17 NOTE — Progress Notes (Signed)
Per MD order foley catheter was removed at 1130.  Will continue to monitor for urine output.

## 2013-06-17 NOTE — Progress Notes (Signed)
06/17/13 1200  PT Visit Information  Last PT Received On 06/17/13  Reason Eval/Treat Not Completed Patient declined, no reason specified

## 2013-06-17 NOTE — Progress Notes (Signed)
Patient attempted to void again on bedside commode. She was unable to void, even though she felt an urge to go. Bladder scan performed after patient in bed for 357 cc's. Hospitalist on call paged and made aware, and new order received to reinsert foley. Patient aware of plan.

## 2013-06-17 NOTE — Progress Notes (Signed)
[facility] PROGRESS NOTE  ARMANII URBANIK WUJ:[id number] DOB: [birth year] DOA: 06/12/2013 PCP: [name], MD  Brief narrative: Makayla [name] is an 62 y.o. female with a PMH of metastatic lung cancer who was admitted on 06/12/2013 with acute respiratory failure secondary to flash pulmonary edema from atrial fibrillation with rapid ventricular response in the setting of acute pneumonia. She has been stable off BiPAP for 48 hours with plans to discharge home 06/17/2013 if she remains stable.  Assessment/Plan: Principal Problem:   Acute respiratory failure with hypoxia / SOB (shortness of breath) -Secondary to HCAP and flash pulmonary edema in the setting of atrial fibrillation with rapid ventricular response. Initially on vancomycin/Zosyn.  Switched to oral doxycycline/Levaquin 06/16/13. -Initially treated with BiPAP, but no BiPAP needed x72 hours. -Initially treated with Cardizem drip which has been discontinued. -Diuresed to address flash pulmonary edema.  -Pulmonology/critical care initially following patient. Signed off 06/14/2013. -CT scan consistent with infection/possible aspiration. No evidence of pulmonary embolism. -Mobilize for possible discharge home 06/17/2013. Check pulse oximetry with activity and set up home oxygen if indicated. Active Problems:   Urinary retention with ARF -Bump in creatinine associated with acute urinary retention.  Bump may be from prior diuresis. -Resume IVF.   -Unable to void with removal of Foley. -Continue Bethanechol and I&O cath PRN. -Re-check creatinine in a.m. After gentle hydration.    Hypokalemia -Replete.   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  Resolved with IVF.   COPD UNSPECIFIED -Unclear diagnosis.   Lung cancer -Seen by [name] 06/13/2013, and he will assist with ongoing management. -Current therapy: Xgeva 120 mg subcutaneously given on a monthly basis for bone metastasis status post 16 treatments. -Recommend ongoing  aggressive care with the ultimate referral to [facility] or [facility] for consideration of participation in clinical trials.   Multiple brain mets -Receiving whole brain irradiation under the care of Dr. Michell Heinrich. -Continue Decadron.   HCAP (healthcare-associated pneumonia) -Treated with vancomycin and Zosyn---now on oral doxycycline/Levaquin.   Atrial fibrillation with rapid ventricular response -Heart rate stable, off Cardizem drip.  Code Status: DNR Family Communication: 2 sons updated at the bedside. Disposition Plan: Home when stable.   Medical Consultants:  Dr. Max Fickle, Critical care  Dr. [name], Oncology  Other Consultants:  None.  Anti-infectives:  Vancomycin 06/12/2013--->06/16/13  Zosyn 06/12/2013--->06/16/13  Doxycycline 06/16/13--->  Levaquin 06/16/13--->  HPI/Subjective: Makayla [name] feels much better. Was unable to void last p.m. And a foley was placed.  Foley removed earlier today, but still unable to void.  Feels well otherwise.  Objective: Filed Vitals:   06/16/13 1652 06/16/13 2114 06/17/13 0521 06/17/13 1430  BP: 105/81 134/95 125/79 119/77  Pulse: 109 123 121 121  Temp:  98 F (36.7 C) 98.1 F (36.7 C) 97.5 F (36.4 C)  TempSrc:  Oral Oral Oral  Resp:  18 20 18   Height:      Weight:      SpO2:  98% 100% 95%    Intake/Output Summary (Last 24 hours) at 06/17/13 1604 Last data filed at 06/17/13 0737  Gross per 24 hour  Intake    120 ml  Output    450 ml  Net   -330 ml    Exam: Gen:  NAD Cardiovascular:  Regular, tachycardic Respiratory:  Lungs CTAB Gastrointestinal:  Abdomen soft, NT/ND, + BS Extremities:  Trace edema  Data Reviewed: Basic Metabolic Panel:  Recent Labs Lab 06/12/13 1525 06/14/13 0337 06/16/13 0418 06/17/13 0340  NA 123* 131* 134* 135  K 3.6 3.6 3.1* 3.8  CL 89* 98 98 101  CO2 25 23 25 23   GLUCOSE 134* 96 112* 110*  BUN 26* 13 17 28*  CREATININE 0.57 0.50 0.81 1.57*   CALCIUM 8.8 8.1* 7.8* 7.9*   GFR Estimated Creatinine Clearance: 31.1 ml/min (by C-G formula based on Cr of 1.57). Liver Function Tests:  Recent Labs Lab 06/12/13 1525  AST 12  ALT 20  ALKPHOS 100  BILITOT 0.6  PROT 6.0  ALBUMIN 2.9*   CBC:  Recent Labs Lab 06/12/13 1525 06/14/13 0337 06/16/13 0418  WBC 13.9* 7.0 8.9  NEUTROABS 12.8*  --   --   HGB 13.1 11.9* 10.8*  HCT 37.2 33.9* 31.4*  MCV 84.2 83.9 85.6  PLT 185 120* 116*   Microbiology Recent Results (from the past 240 hour(s))  CULTURE, BLOOD (ROUTINE X 2)     Status: None   Collection Time    06/12/13  9:45 PM      Result Value Range Status   Specimen Description BLOOD RIGHT HAND   Final   Special Requests BOTTLES DRAWN AEROBIC ONLY   Final   Culture  Setup Time     Final   Value: 06/13/2013 02:21     Performed at [facility]   Culture     Final   Value:        BLOOD CULTURE RECEIVED NO GROWTH TO DATE CULTURE WILL BE HELD FOR 5 DAYS BEFORE ISSUING A FINAL NEGATIVE REPORT     Performed at [facility]   Report Status PENDING   Incomplete  CULTURE, BLOOD (ROUTINE X 2)     Status: None   Collection Time    06/12/13 10:10 PM      Result Value Range Status   Specimen Description BLOOD RIGHT FOREARM   Final   Special Requests BOTTLES DRAWN AEROBIC ONLY 1.5ML   Final   Culture  Setup Time     Final   Value: 06/13/2013 02:21     Performed at [facility]   Culture     Final   Value:        BLOOD CULTURE RECEIVED NO GROWTH TO DATE CULTURE WILL BE HELD FOR 5 DAYS BEFORE ISSUING A FINAL NEGATIVE REPORT     Performed at [facility]   Report Status PENDING   Incomplete  MRSA PCR SCREENING     Status: Abnormal   Collection Time    06/12/13 10:43 PM      Result Value Range Status   MRSA by PCR POSITIVE (*) NEGATIVE Final   Comment:            The GeneXpert MRSA Assay (FDA     approved for NASAL specimens     only), is one component of a     comprehensive MRSA  colonization     surveillance program. It is not     intended to diagnose MRSA     infection nor to guide or     monitor treatment for     MRSA infections.     RESULT CALLED TO, READ BACK BY AND VERIFIED WITH:     [facility] RN @ 0127 ON 9.11.2014 BY MCREYNOLDS,B     Procedures and Diagnostic Studies:  Dg Chest 2 View 06/12/2013   *RADIOLOGY REPORT*  Clinical Data: Shortness of breath.  History of right lung cancer.  CHEST - 2 VIEW  Comparison: Chest  CT 06/12/2013  Findings: Persistent volume loss and right lung opacity identified. Nodular opacities are identified at the left lung base, better seen on CT of the same day.  There is gaseous distension of the stomach.  IMPRESSION:  1.  Stable opacity/volume loss in the right lung. 2.  Nodularity in the left lung base, better seen on CT of the same day.  This represents a change since prior studies.   Original Report Authenticated By: Norva Pavlov, M.D.    Ct Angio Chest Pe W/cm &/or Wo Cm 06/12/2013   *RADIOLOGY REPORT*  Clinical Data: History of COPD and lung cancer with osseous metastatic disease, now with shortness of breath, evaluate for pulmonary embolism  CT ANGIOGRAPHY CHEST  Technique:  Multidetector CT imaging of the chest using the standard protocol during bolus administration of intravenous contrast. Multiplanar reconstructed images including MIPs were obtained and reviewed to evaluate the vascular anatomy.  Contrast: OMNIPAQUE IOHEXOL 350 MG/ML SOLN  Comparison: CT of the chest, abdomen and pelvis - 05/13/2013; chest CT - 08/24/2012; 03/09/2012  Findings:  There is adequate opacification of the pulmonary arterial system of the main pulmonary artery measuring 677 HU. There is expected rapid tapering of the near occlusion of the right main pulmonary artery due to chronic complete atelectasis/collapse of the right lung. There are no discrete filling defects within the pulmonary arterial tree to suggest pulmonary embolism.  The pulmonary  artery remains enlarged measuring 3.4 cm in diameter.  Normal heart size.  No definite pericardial effusion.  Normal caliber thoracic aorta.  Possible bovine configuration of the aortic arch.  No definite thoracic aortic dissection or periaortic stranding.  ---------------------------------------------------------  Nonvascular findings:  There is persistent findings of complete atelectasis/collapse of the remaining right lung.  The previously described mass within the atelectatic remaining right lung is difficult to definitively measure on the present examination due to associated volume loss and heterogeneous enhancement of the remaining pulmonary parenchyma.  There is no discrete enhancing nodularity of the right sided pleural. Grossly unchanged appearance of approximately 0.6 cm left-sided distal periaortic lymph node (image 51, series five). No new mediastinal or contralateral left hilar lymphadenopathy.  There is been interval development of debris within the residua of the right mainstem bronchus (images 29 - 31, series eight).  This finding is associated with development of ill-defined nodular opacities within the aerated left lung, most conspicuously within the left lower lobe many of which demonstrate a tree in bud configuration (representative images 39 and 43, series eight). While no discrete filling defects are seen within the left main stem bronchi, these constellation of findings are worrisome for aspiration.  Early arterial phase evaluation of the superior aspect of the abdomen suggests a possible small hiatal hernia.  Redemonstrated extensive osseous metastatic disease to the thoracic spine again most conspicuously involving the C5 and T2 vertebral bodies as well as the right pedicle of T10 as well as the imaged proximal aspect of the left humerus.  Grossly unchanged severe compression deformity involving the posterior aspect of the T8 vertebral body.  IMPRESSION:  1.  No evidence of pulmonary  embolism. 2.  Grossly unchanged complete atelectasis/collapse of the right lower lobe.  Previously identified heterogeneously enhancing right- sided lung mass is not well demonstrated on the present examination secondary to the adjacent atelectatic lung.  3.  Interval development of a large amount of debris within the residua of the right mainstem bronchus with associated development of ill-defined nodular tree in bud opacities primarily within  the contralateral left lower lobe worrisome for aspiration.  4.  Grossly unchanged osseous metastatic disease to the chest as detailed above.  5.  Chronic enlargement of the caliber of the main pulmonary artery which is nonspecific in the setting of complete collapse/atelectasis in the right lung but could be indicative of pulmonary arterial hypertension.   Original Report Authenticated By: Tacey Ruiz, MD     Dg Chest Portable 1 View 06/12/2013   *RADIOLOGY REPORT*  Clinical Data: Weakness.  Lung cancer  PORTABLE CHEST - 1 VIEW  Comparison: 06/12/2013  Findings: There is a large right pleural effusion.  This is similar in volume to the previous exam.  There is complete opacification of the right hemithorax.  Nodularity in the left lower lobe is unchanged.  Diffuse bronchitic changes are identified.  There is a sclerotic metastasis identified within the proximal left humerus.  IMPRESSION:  1.  No change and large right pleural effusion with complete opacification of the right hemithorax. 2.  Left lung nodularity as before.   Original Report Authenticated By: Signa Kell, M.D.    Scheduled Meds: . acyclovir ointment   Topical Q4H  . antiseptic oral rinse  15 mL Mouth Rinse q12n4p  . bethanechol  25 mg Oral TID  . chlorhexidine  15 mL Mouth Rinse BID  . dexamethasone  4 mg Intravenous Daily  . doxycycline  100 mg Oral Q12H  . feeding supplement  1 Container Oral TID BM  . levofloxacin  750 mg Oral Daily  . morphine  30 mg Oral Q8H  . mupirocin ointment  1  application Nasal BID  . sodium chloride  3 mL Intravenous Q12H   Continuous Infusions:   Time spent: 25 minutes.    LOS: 5 days   Tanner Vigna  [facility] Pager [phone].   *Please note that the hospitalists switch teams on Wednesdays. Please call the flow manager at [phone] if you are having difficulty reaching the hospitalist taking care of this patient as she can update you and provide the most up-to-date pager number of provider caring for the patient. If 8PM-8AM, please contact night-coverage at [email or web address], password [email or web address]  06/17/2013, 4:04 PM

## 2013-06-17 NOTE — Progress Notes (Signed)
Pt. Was bladder scanned the scan showed 230 cc's of urine in the bladder. Pt. Stated she would like to try and use the bedside commode. Pt. Was still unable to void. Per MD order Pt. Was in and out catheterized and 230 cc's were emptied.  Post residual bladder scan showed 0 cc's left in bladder.  Will continue to monitor.

## 2013-06-17 NOTE — Progress Notes (Addendum)
Per MD request pt was bladder scanned.  Scan showed 195 cc's in bladder. Pt. Stated that she did not have any urge to go to urinate. Amount of scan was paged to MD.  New orders were given, will continue to monitor.

## 2013-06-17 NOTE — Care Management Note (Signed)
Cm spoke with patient at the bedside with adult sons present. Pt recommendation for HHPT. Per pt choice [facility] to provide [facility] services. [facility] rep Christina notified of referral. Pt's sons to assist in home care/24 hour supervision. Pt currently on 1L n/c during consult. Pt informed if unable to wean off oxygen, RN to assess eligibility for home o2.    Roxy Manns Tonya Carlile,RN,MSN [phone]

## 2013-06-17 NOTE — Progress Notes (Deleted)
The patient needs a PICC for medically necessary procedures/initiation of pressors for treatment of refractory hypotension.  She is unable to fully give consent due to her mental status, and no family members are available to consent for her.  RAMA,CHRISTINA 06/17/2013 4:01 PM

## 2013-06-18 ENCOUNTER — Encounter (HOSPITAL_COMMUNITY): Payer: Self-pay | Admitting: Internal Medicine

## 2013-06-18 ENCOUNTER — Inpatient Hospital Stay (HOSPITAL_COMMUNITY): Payer: Managed Care, Other (non HMO)

## 2013-06-18 ENCOUNTER — Other Ambulatory Visit: Payer: Managed Care, Other (non HMO) | Admitting: Lab

## 2013-06-18 ENCOUNTER — Ambulatory Visit: Payer: Managed Care, Other (non HMO) | Admitting: Internal Medicine

## 2013-06-18 ENCOUNTER — Ambulatory Visit: Payer: Managed Care, Other (non HMO)

## 2013-06-18 DIAGNOSIS — L02821 Furuncle of head [any part, except face]: Secondary | ICD-10-CM | POA: Diagnosis present

## 2013-06-18 DIAGNOSIS — N179 Acute kidney failure, unspecified: Secondary | ICD-10-CM | POA: Diagnosis not present

## 2013-06-18 DIAGNOSIS — R339 Retention of urine, unspecified: Secondary | ICD-10-CM

## 2013-06-18 HISTORY — DX: Retention of urine, unspecified: R33.9

## 2013-06-18 LAB — BASIC METABOLIC PANEL
CO2: 22 mEq/L (ref 19–32)
Chloride: 104 mEq/L (ref 96–112)
GFR calc Af Amer: 41 mL/min — ABNORMAL LOW (ref >90)
Sodium: 138 mEq/L (ref 135–145)

## 2013-06-18 MED ORDER — DILTIAZEM HCL ER COATED BEADS 180 MG PO CP24
180.0000 mg | ORAL_CAPSULE | Freq: Every day | ORAL | Status: DC
  Filled 2013-06-18 (×6): qty 1

## 2013-06-18 MED ORDER — LEVOFLOXACIN 750 MG PO TABS
750.0000 mg | ORAL_TABLET | ORAL | Status: DC
  Filled 2013-06-18: qty 1

## 2013-06-18 NOTE — ED Provider Notes (Signed)
  Medical screening examination/treatment/procedure(s) were performed by non-physician practitioner and as supervising physician I was immediately available for consultation/collaboration.    Adisen Bennion, MD 06/18/13 0728 

## 2013-06-18 NOTE — Progress Notes (Signed)
Weekly Management Note Current Dose:25 Gy  Projected Dose:30 Gy   Narrative:  The patient presents for routine under treatment assessment.  CBCT/MVCT images/Port film x-rays were reviewed.  The chart was checked. Admitted for afib and pneumonia. Has had problems with urinary retention now. Per son, neck pain is gone. She is "loopy" this morning" ? Med given this morning. Would like to go home. Per [facility] still candidate for clinical trial. Tired. C/o dysphagia. No headache.    Physical Findings: Oriented x 2. Lip sore better.   Vitals:  Filed Vitals:   06/18/13 1245  BP: 132/93  Pulse: 123  Temp: 97.6 F (36.4 C)  Resp: 20   Weight:  Wt Readings from Last 3 Encounters:  06/15/13 128 lb 1.4 oz (58.1 kg)  06/11/13 129 lb 1.6 oz (58.559 kg)  06/05/13 132 lb 12.8 oz (60.238 kg)   Lab Results  Component Value Date   WBC 8.9 06/16/2013   HGB 10.8* 06/16/2013   HCT 31.4* 06/16/2013   MCV 85.6 06/16/2013   PLT 116* 06/16/2013   Lab Results  Component Value Date   CREATININE 1.55* 06/18/2013   BUN 28* 06/18/2013   NA 138 06/18/2013   K 3.7 06/18/2013   CL 104 06/18/2013   CO2 22 06/18/2013     Impression:  The patient is tolerating radiation.  Plan:  Continue treatment as planned. Add mmw with viscous lidocaine and carafate to meds for dysphagia.

## 2013-06-18 NOTE — Progress Notes (Signed)
Pt unable to void overnight, despite I&O cath x 2 yesterday. Pt c/o urge to void at 0630 and was placed on bedpan. Unable to void, so pt was bladder scan. Scan revealed >509 ml in bladder. 16 Fr. Foley placed per MD order for urinary retention. Immediately returned of yellow, clear urine in drainage bag.

## 2013-06-18 NOTE — Progress Notes (Addendum)
[facility] PROGRESS NOTE  Makayla [name] ZOX:[id number] DOB: [birth year] DOA: 06/12/2013 PCP: [name], MD  Brief narrative: Makayla [name] is an 62 y.o. female with a PMH of metastatic lung cancer who was admitted on 06/12/2013 with acute respiratory failure secondary to flash pulmonary edema from atrial fibrillation with rapid ventricular response in the setting of acute pneumonia. She has been stable off BiPAP, with plans to discharge 06/17/2013 however she has developed acute urinary retention of unclear etiology. She has failed attempts at voiding trials and at this point, urology consultation has been requested.  Assessment/Plan: Principal Problem:   Acute respiratory failure with hypoxia / SOB (shortness of breath) -Secondary to HCAP and flash pulmonary edema in the setting of atrial fibrillation with rapid ventricular response. Initially on vancomycin/Zosyn.  Switched to oral doxycycline/Levaquin 06/16/13. -Initially treated with BiPAP, but no BiPAP needed x72 hours. -Initially treated with Cardizem drip which has been discontinued. -Diuresed to address flash pulmonary edema.  -Pulmonology/critical care initially following patient. Signed off 06/14/2013. -CT scan consistent with infection/possible aspiration. No evidence of pulmonary embolism. Active Problems:   Scalp furuncle -Draining pus.  Pus sent for culture.  Wet to dry dressing changes.   Urinary retention with ARF -Bump in creatinine associated with acute urinary retention.  Bump may be from prior diuresis. -Creatinine stable but above usual baseline with IVF.  Check renal ultrasound.   -Unable to void with removal of Foley. The Foley replaced. D/C bethanechol.   Hypokalemia -Replete.   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  Resolved with IVF.   COPD UNSPECIFIED -Unclear diagnosis.   Lung cancer -Seen by [name] 06/13/2013, and he will assist with ongoing management. -Current therapy: Xgeva 120 mg  subcutaneously given on a monthly basis for bone metastasis status post 16 treatments. -Recommend ongoing aggressive care with the ultimate referral to [facility] or [facility] for consideration of participation in clinical trials.   Multiple brain mets -Receiving whole brain irradiation under the care of Dr. Michell Heinrich. -Continue Decadron.   HCAP (healthcare-associated pneumonia) -Treated with vancomycin and Zosyn---now on oral doxycycline/Levaquin.   Atrial fibrillation with rapid ventricular response -Heart rate stable, off Cardizem drip. -Start by mouth Cardizem given heart rate in the 120s.  Code Status: DNR Family Communication: 2 sons updated at the bedside. Disposition Plan: Home when stable.   Medical Consultants:  Dr. Max Fickle, Critical care  Dr. [name], Oncology  Other Consultants:  None.  Anti-infectives:  Vancomycin 06/12/2013--->06/16/13  Zosyn 06/12/2013--->06/16/13  Doxycycline 06/16/13--->  Levaquin 06/16/13--->  HPI/Subjective: Makayla [name] continues to be unable to void. She had in and out catheterizations done yesterday evening but developed recurrent urinary retention and the Foley was replaced. No sense of urgency although she does report that she feels better once her bladder has been catheterized with alleviation of pressure. Still with no appetite. Reports a spot on her scalp that is draining pus..  Objective: Filed Vitals:   06/17/13 0521 06/17/13 1430 06/17/13 2209 06/18/13 0525  BP: 125/79 119/77 131/90 127/88  Pulse: 121 121 121 123  Temp: 98.1 F (36.7 C) 97.5 F (36.4 C) 98.1 F (36.7 C) 98.4 F (36.9 C)  TempSrc: Oral Oral Oral Oral  Resp: 20 18 20 20   Height:      Weight:      SpO2: 100% 95% 98% 99%    Intake/Output Summary (Last 24 hours) at 06/18/13 1124 Last data filed at 06/18/13 0900  Gross per 24 hour  Intake 1616.67 ml  Output    505 ml  Net 1111.67 ml    Exam: Gen:  NAD Scalp: 2  furuncles. 1 spontaneously draining pus. Spotty alopecia. Cardiovascular:  Regular, tachycardic Respiratory:  Lungs CTAB Gastrointestinal:  Abdomen soft, NT/ND, + BS Extremities:  Trace edema  Data Reviewed: Basic Metabolic Panel:  Recent Labs Lab 06/12/13 1525 06/14/13 0337 06/16/13 0418 06/17/13 0340 06/18/13 0409  NA 123* 131* 134* 135 138  K 3.6 3.6 3.1* 3.8 3.7  CL 89* 98 98 101 104  CO2 25 23 25 23 22   GLUCOSE 134* 96 112* 110* 95  BUN 26* 13 17 28* 28*  CREATININE 0.57 0.50 0.81 1.57* 1.55*  CALCIUM 8.8 8.1* 7.8* 7.9* 7.8*   GFR Estimated Creatinine Clearance: 31.5 ml/min (by C-G formula based on Cr of 1.55). Liver Function Tests:  Recent Labs Lab 06/12/13 1525  AST 12  ALT 20  ALKPHOS 100  BILITOT 0.6  PROT 6.0  ALBUMIN 2.9*   CBC:  Recent Labs Lab 06/12/13 1525 06/14/13 0337 06/16/13 0418  WBC 13.9* 7.0 8.9  NEUTROABS 12.8*  --   --   HGB 13.1 11.9* 10.8*  HCT 37.2 33.9* 31.4*  MCV 84.2 83.9 85.6  PLT 185 120* 116*   Microbiology Recent Results (from the past 240 hour(s))  CULTURE, BLOOD (ROUTINE X 2)     Status: None   Collection Time    06/12/13  9:45 PM      Result Value Range Status   Specimen Description BLOOD RIGHT HAND   Final   Special Requests BOTTLES DRAWN AEROBIC ONLY   Final   Culture  Setup Time     Final   Value: 06/13/2013 02:21     Performed at [facility]   Culture     Final   Value:        BLOOD CULTURE RECEIVED NO GROWTH TO DATE CULTURE WILL BE HELD FOR 5 DAYS BEFORE ISSUING A FINAL NEGATIVE REPORT     Performed at [facility]   Report Status PENDING   Incomplete  CULTURE, BLOOD (ROUTINE X 2)     Status: None   Collection Time    06/12/13 10:10 PM      Result Value Range Status   Specimen Description BLOOD RIGHT FOREARM   Final   Special Requests BOTTLES DRAWN AEROBIC ONLY 1.5ML   Final   Culture  Setup Time     Final   Value: 06/13/2013 02:21     Performed at [facility]   Culture      Final   Value:        BLOOD CULTURE RECEIVED NO GROWTH TO DATE CULTURE WILL BE HELD FOR 5 DAYS BEFORE ISSUING A FINAL NEGATIVE REPORT     Performed at [facility]   Report Status PENDING   Incomplete  MRSA PCR SCREENING     Status: Abnormal   Collection Time    06/12/13 10:43 PM      Result Value Range Status   MRSA by PCR POSITIVE (*) NEGATIVE Final   Comment:            The GeneXpert MRSA Assay (FDA     approved for NASAL specimens     only), is one component of a     comprehensive MRSA colonization     surveillance program. It is not     intended to diagnose MRSA     infection nor to  guide or     monitor treatment for     MRSA infections.     RESULT CALLED TO, READ BACK BY AND VERIFIED WITH:     [facility] RN @ 0127 ON 9.11.2014 BY MCREYNOLDS,B     Procedures and Diagnostic Studies:  Dg Chest 2 View 06/12/2013   *RADIOLOGY REPORT*  Clinical Data: Shortness of breath.  History of right lung cancer.  CHEST - 2 VIEW  Comparison: Chest CT 06/12/2013  Findings: Persistent volume loss and right lung opacity identified. Nodular opacities are identified at the left lung base, better seen on CT of the same day.  There is gaseous distension of the stomach.  IMPRESSION:  1.  Stable opacity/volume loss in the right lung. 2.  Nodularity in the left lung base, better seen on CT of the same day.  This represents a change since prior studies.   Original Report Authenticated By: Norva Pavlov, M.D.    Ct Angio Chest Pe W/cm &/or Wo Cm 06/12/2013   *RADIOLOGY REPORT*  Clinical Data: History of COPD and lung cancer with osseous metastatic disease, now with shortness of breath, evaluate for pulmonary embolism  CT ANGIOGRAPHY CHEST  Technique:  Multidetector CT imaging of the chest using the standard protocol during bolus administration of intravenous contrast. Multiplanar reconstructed images including MIPs were obtained and reviewed to evaluate the vascular anatomy.  Contrast: OMNIPAQUE  IOHEXOL 350 MG/ML SOLN  Comparison: CT of the chest, abdomen and pelvis - 05/13/2013; chest CT - 08/24/2012; 03/09/2012  Findings:  There is adequate opacification of the pulmonary arterial system of the main pulmonary artery measuring 677 HU. There is expected rapid tapering of the near occlusion of the right main pulmonary artery due to chronic complete atelectasis/collapse of the right lung. There are no discrete filling defects within the pulmonary arterial tree to suggest pulmonary embolism.  The pulmonary artery remains enlarged measuring 3.4 cm in diameter.  Normal heart size.  No definite pericardial effusion.  Normal caliber thoracic aorta.  Possible bovine configuration of the aortic arch.  No definite thoracic aortic dissection or periaortic stranding.  ---------------------------------------------------------  Nonvascular findings:  There is persistent findings of complete atelectasis/collapse of the remaining right lung.  The previously described mass within the atelectatic remaining right lung is difficult to definitively measure on the present examination due to associated volume loss and heterogeneous enhancement of the remaining pulmonary parenchyma.  There is no discrete enhancing nodularity of the right sided pleural. Grossly unchanged appearance of approximately 0.6 cm left-sided distal periaortic lymph node (image 51, series five). No new mediastinal or contralateral left hilar lymphadenopathy.  There is been interval development of debris within the residua of the right mainstem bronchus (images 29 - 31, series eight).  This finding is associated with development of ill-defined nodular opacities within the aerated left lung, most conspicuously within the left lower lobe many of which demonstrate a tree in bud configuration (representative images 39 and 43, series eight). While no discrete filling defects are seen within the left main stem bronchi, these constellation of findings are worrisome  for aspiration.  Early arterial phase evaluation of the superior aspect of the abdomen suggests a possible small hiatal hernia.  Redemonstrated extensive osseous metastatic disease to the thoracic spine again most conspicuously involving the C5 and T2 vertebral bodies as well as the right pedicle of T10 as well as the imaged proximal aspect of the left humerus.  Grossly unchanged severe compression deformity involving the posterior aspect of the  T8 vertebral body.  IMPRESSION:  1.  No evidence of pulmonary embolism. 2.  Grossly unchanged complete atelectasis/collapse of the right lower lobe.  Previously identified heterogeneously enhancing right- sided lung mass is not well demonstrated on the present examination secondary to the adjacent atelectatic lung.  3.  Interval development of a large amount of debris within the residua of the right mainstem bronchus with associated development of ill-defined nodular tree in bud opacities primarily within the contralateral left lower lobe worrisome for aspiration.  4.  Grossly unchanged osseous metastatic disease to the chest as detailed above.  5.  Chronic enlargement of the caliber of the main pulmonary artery which is nonspecific in the setting of complete collapse/atelectasis in the right lung but could be indicative of pulmonary arterial hypertension.   Original Report Authenticated By: Tacey Ruiz, MD     Dg Chest Portable 1 View 06/12/2013   *RADIOLOGY REPORT*  Clinical Data: Weakness.  Lung cancer  PORTABLE CHEST - 1 VIEW  Comparison: 06/12/2013  Findings: There is a large right pleural effusion.  This is similar in volume to the previous exam.  There is complete opacification of the right hemithorax.  Nodularity in the left lower lobe is unchanged.  Diffuse bronchitic changes are identified.  There is a sclerotic metastasis identified within the proximal left humerus.  IMPRESSION:  1.  No change and large right pleural effusion with complete opacification of the  right hemithorax. 2.  Left lung nodularity as before.   Original Report Authenticated By: Signa Kell, M.D.    Scheduled Meds: . acyclovir ointment   Topical Q4H  . antiseptic oral rinse  15 mL Mouth Rinse q12n4p  . bethanechol  25 mg Oral TID  . chlorhexidine  15 mL Mouth Rinse BID  . dexamethasone  4 mg Intravenous Daily  . doxycycline  100 mg Oral Q12H  . feeding supplement  1 Container Oral TID BM  . [START ON 06/19/2013] levofloxacin  750 mg Oral Q48H  . morphine  30 mg Oral Q8H  . sodium chloride  3 mL Intravenous Q12H   Continuous Infusions:   Time spent: 35 minutes with > 50% of time discussing current diagnostic test results, clinical impression and plan of care.     LOS: 6 days   RAMA,CHRISTINA  [facility] Pager [phone].   *Please note that the hospitalists switch teams on Wednesdays. Please call the flow manager at [phone] if you are having difficulty reaching the hospitalist taking care of this patient as she can update you and provide the most up-to-date pager number of provider caring for the patient. If 8PM-8AM, please contact night-coverage at [email or web address], password [facility]  06/18/2013, 11:24 AM

## 2013-06-18 NOTE — Progress Notes (Signed)
Pt was bladder scanned at 2310. Bladder scan revealed . Pt denied feeling a sensation of having to void. Pt did attempt x 2 to void using the bedpan and BSC but was unable to. I & O cath performed for 2nd time. of urine obtained. PVR showed no urine retained. Will continue to monitor.

## 2013-06-18 NOTE — Progress Notes (Addendum)
PT Cancellation Note  Patient Details Name: Makayla [name] MRN: [id number] DOB: 10/24/1950   Cancelled Treatment:    Reason Eval/Treat Not Completed: Patient at procedure or test/unavailable. Will check back at later time. Thanks.  Addendum: Checked back ~3:45, pt declined to participate. Will check back another day.   Rebeca Alert, MPT Pager: [phone]

## 2013-06-18 NOTE — Consult Note (Signed)
Reason for Consult: New Urinary Retention  Referring Physician: Hillery Aldo MD  Makayla [name] is an 62 y.o. female.   HPI:   1 - Urinary Retention  - Pt with metastatic stage IV lung cancer s/p multiple treatments including surgery and chemo radiation with known progressive intracranial and osseous vertebral mets with new uriary retention during hospitalization for failure to thrive and shortness of breath. She was in midst of brain and spinal radiation before current hospitalization.  Pt has failed several trials of void in hospital with foley removed and feels no sensation of fullness but unable to initiate voiding. She also has new fecal incontinence as well.   2 - Acute Renal Failure - Baseline Cr <1. Now with acute rise to 1.55 level. CT this admission without hydro also none on renal US today. She does have new retention as per above.   Today Makayla [name] is seen in consultation for above. Denies prior GU complaints. SHe has h/o benign hysterectomy. Her current goal is to hopefully gain enough strength to become candidate for clinical trial.  Past Medical History  Diagnosis Date  . COPD (chronic obstructive pulmonary disease)   . Hx of radiation therapy   . Hx antineoplastic chemotherapy   . Shortness of breath   . GERD (gastroesophageal reflux disease)   . Lung cancer     lung ca dx 12/10  . Lung cancer 09/01/2011    bone mets  . 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 HCAP (healthcare-associated pneumonia) 06/12/2013  . Urinary retention 06/18/2013    Past Surgical History  Procedure Laterality Date  . Abdominal hysterectomy    . Surgery right leg, rod and pin      Family History  Problem Relation Age of Onset  . Cancer Neg Hx     Social History:  reports that she quit smoking about 12 years ago. Her smoking use included Cigarettes. She smoked 0.50 packs per day. She does not have any smokeless tobacco history on file. She reports  that she does not drink alcohol or use illicit drugs.  Allergies:  Allergies  Allergen Reactions  . Sulfonamide Derivatives Nausea And Vomiting    Medications: I have reviewed the patient's current medications.  Results for orders placed during the hospital encounter of 06/12/13 (from the past 48 hour(s))  BASIC METABOLIC PANEL     Status: Abnormal   Collection Time    06/17/13  3:40 AM      Result Value Range   Sodium 135  135 - 145 mEq/L   Potassium 3.8  3.5 - 5.1 mEq/L   Comment: DELTA CHECK NOTED     REPEATED TO VERIFY     NO VISIBLE HEMOLYSIS   Chloride 101  96 - 112 mEq/L   CO2 23  19 - 32 mEq/L   Glucose, Bld 110 (*) 70 - 99 mg/dL   BUN 28 (*) 6 - 23 mg/dL   Comment: DELTA CHECK NOTED     REPEATED TO VERIFY   Creatinine, Ser 1.57 (*) 0.50 - 1.10 mg/dL   Comment: DELTA CHECK NOTED     REPEATED TO VERIFY   Calcium 7.9 (*) 8.4 - 10.5 mg/dL   GFR calc non Af Amer 35 (*) >90 mL/min   GFR calc Af Amer 40 (*) >90 mL/min   Comment: (NOTE)     The eGFR has been calculated using the CKD EPI equation.  This calculation has not been validated in all clinical situations.     eGFR's persistently <90 mL/min signify possible Chronic Kidney     Disease.  BASIC METABOLIC PANEL     Status: Abnormal   Collection Time    06/18/13  4:09 AM      Result Value Range   Sodium 138  135 - 145 mEq/L   Potassium 3.7  3.5 - 5.1 mEq/L   Chloride 104  96 - 112 mEq/L   CO2 22  19 - 32 mEq/L   Glucose, Bld 95  70 - 99 mg/dL   BUN 28 (*) 6 - 23 mg/dL   Creatinine, Ser 1.61 (*) 0.50 - 1.10 mg/dL   Calcium 7.8 (*) 8.4 - 10.5 mg/dL   GFR calc non Af Amer 35 (*) >90 mL/min   GFR calc Af Amer 41 (*) >90 mL/min   Comment: (NOTE)     The eGFR has been calculated using the CKD EPI equation.     This calculation has not been validated in all clinical situations.     eGFR's persistently <90 mL/min signify possible Chronic Kidney     Disease.    No results found.  Review of Systems   Constitutional: Positive for weight loss and malaise/fatigue. Negative for fever.  HENT: Negative.   Eyes: Negative.   Respiratory: Negative.   Cardiovascular: Negative.   Gastrointestinal: Positive for diarrhea.       Fecal incontinence  Genitourinary: Negative for flank pain.       Loss of sensation of bladder filling  Musculoskeletal: Negative.   Skin: Negative.   Neurological: Positive for weakness.  Endo/Heme/Allergies: Negative.   Psychiatric/Behavioral: Negative.    Blood pressure 127/88, pulse 123, temperature 98.4 F (36.9 C), temperature source Oral, resp. rate 20, height 5\' 3"  (1.6 m), weight 58.1 kg (128 lb 1.4 oz), SpO2 99.00%. Physical Exam  Constitutional: She is oriented to person, place, and time.  Cachectic, and ill appearing. Son at bedside  HENT:  Head: Normocephalic and atraumatic.  Alopecia of treatment  Eyes: EOM are normal. Pupils are equal, round, and reactive to light.  Neck: Neck supple.  Cardiovascular: Regular rhythm.   tachycardic  Respiratory: Effort normal and breath sounds normal.  GI: Soft. Bowel sounds are normal.  Genitourinary: Vagina normal.  Foley c/d/i with clear yellow urine  Musculoskeletal: Normal range of motion.  Neurological: She is alert and oriented to person, place, and time.  Abnormal sensory function of lower extremities.  Skin: Skin is warm and dry.  Psychiatric: She has a normal mood and affect. Her behavior is normal. Judgment and thought content normal.    Assessment/Plan:  1 - Urinary Retention  - Likely neurologic in origin from intracranial mets v. New cord compression from known bony verterbral mets.  Confirmed with exam with new decreased LE sensory function.  Discussed short and long term management options including intermittent cath, chronic foley, SP Tube.  Given overall clinical picture and very guarded prognosis continued foley may be best option in short-medium term.  I explained risks including asymptomatic  bacteruria, urethral damage (over long periods of time) and need for at least Q monthly changes by home health or at our office.   I will arrange for outpatient follow up in about 4 weeks for repeat trial of void and catheter change. May consider urodynamics pending clinical situation at that time.  2 - Acute Renal Failure - Suspect either pre-renal v. Urinary retention as etiology as less  likely given no hydro on recent imaging including new renal US today.   Dorsey Authement 06/18/2013, 12:39 PM

## 2013-06-19 ENCOUNTER — Inpatient Hospital Stay (HOSPITAL_COMMUNITY): Payer: Managed Care, Other (non HMO)

## 2013-06-19 ENCOUNTER — Ambulatory Visit: Payer: Managed Care, Other (non HMO)

## 2013-06-19 LAB — CBC
HCT: 32.6 % — ABNORMAL LOW (ref 36.0–46.0)
Hemoglobin: 10.5 g/dL — ABNORMAL LOW (ref 12.0–15.0)
MCH: 28.7 pg (ref 26.0–34.0)
MCHC: 32.2 g/dL (ref 30.0–36.0)
RDW: 16.5 % — ABNORMAL HIGH (ref 11.5–15.5)

## 2013-06-19 LAB — BASIC METABOLIC PANEL
BUN: 32 mg/dL — ABNORMAL HIGH (ref 6–23)
Chloride: 109 mEq/L (ref 96–112)
Creatinine, Ser: 1.25 mg/dL — ABNORMAL HIGH (ref 0.50–1.10)
GFR calc Af Amer: 53 mL/min — ABNORMAL LOW (ref >90)
GFR calc non Af Amer: 45 mL/min — ABNORMAL LOW (ref >90)
Glucose, Bld: 118 mg/dL — ABNORMAL HIGH (ref 70–99)

## 2013-06-19 LAB — CULTURE, BLOOD (ROUTINE X 2): Culture: NO GROWTH

## 2013-06-19 MED ORDER — FUROSEMIDE 10 MG/ML IJ SOLN
40.0000 mg | Freq: Every day | INTRAMUSCULAR | Status: DC
  Administered 2013-06-19 – 2013-06-21 (×3): 40 mg via INTRAVENOUS
  Filled 2013-06-19 (×4): qty 4

## 2013-06-19 MED ORDER — IPRATROPIUM BROMIDE 0.02 % IN SOLN
0.5000 mg | RESPIRATORY_TRACT | Status: DC
  Administered 2013-06-19 – 2013-06-26 (×43): 0.5 mg via RESPIRATORY_TRACT
  Filled 2013-06-19 (×42): qty 2.5

## 2013-06-19 MED ORDER — IPRATROPIUM BROMIDE 0.02 % IN SOLN: 0.5000 mg | RESPIRATORY_TRACT | Status: DC | PRN

## 2013-06-19 MED ORDER — ALBUTEROL SULFATE (5 MG/ML) 0.5% IN NEBU
2.5000 mg | INHALATION_SOLUTION | RESPIRATORY_TRACT | Status: DC
  Administered 2013-06-19 – 2013-06-26 (×43): 2.5 mg via RESPIRATORY_TRACT
  Filled 2013-06-19 (×42): qty 0.5

## 2013-06-19 MED ORDER — FUROSEMIDE 40 MG PO TABS
40.0000 mg | ORAL_TABLET | ORAL | Status: AC
  Administered 2013-06-19: 40 mg via ORAL
  Filled 2013-06-19: qty 1

## 2013-06-19 MED ORDER — ALBUTEROL SULFATE (5 MG/ML) 0.5% IN NEBU: 2.5000 mg | INHALATION_SOLUTION | RESPIRATORY_TRACT | Status: DC | PRN

## 2013-06-19 NOTE — Progress Notes (Signed)
Pt's respiratory status and mental alertness declined over shift. Pt began to have audible crackles without listening with a stethoscope. Pt's urine output was less than 200 ml for the whole day shift. Pt's family voiced concern stating mother's condition had declined since beginning of shift.Pt's upper and lower extremities were cold to touch. Poor perfusion in both hands and feet. Reported this to Divine MD.  Followed order to give pt dose of lasix 40mg   at 1556. Pt did not void after receiving dose (checked foley bag one hour post). Chest xray was completed. Reported this to Divine MD. Around 48 Divine MD called to say she was placing order to transfer pt to step down, draw labs, and give another dose of lasix. Reported this to night shift and explained this to pt and her family.

## 2013-06-19 NOTE — Progress Notes (Addendum)
Clinical Social Work Department CLINICAL SOCIAL WORK PLACEMENT NOTE 06/19/2013  Patient:  Makayla [name], Makayla [name]  Account Number:  [id number] Admit date:  06/12/2013  Clinical Social Worker:  Jacelyn Grip  Date/time:  06/19/2013 11:46 PM  Clinical Social Work is seeking post-discharge placement for this patient at the following level of care:   SKILLED NURSING   (*CSW will update this form in Epic as items are completed)   06/19/2013  Patient/family provided with [facility] Department of Clinical Social Work's list of facilities offering this level of care within the geographic area requested by the patient (or if unable, by the patient's family).  06/19/2013  Patient/family informed of their freedom to choose among providers that offer the needed level of care, that participate in Medicare, Medicaid or managed care program needed by the patient, have an available bed and are willing to accept the patient.  06/19/2013  Patient/family informed of [facility]' ownership interest in [facility], as well as of the fact that they are under no obligation to receive care at this facility.  PASARR submitted to EDS on 06/19/2013 PASARR number received from EDS on 06/19/2013  FL2 transmitted to all facilities in geographic area requested by pt/family on  06/19/2013 FL2 transmitted to all facilities within larger geographic area on   Patient informed that his/her managed care company has contracts with or will negotiate with  certain facilities, including the following:     Patient/family informed of bed offers received:  06/20/2013 Patient chooses bed at [facility] Physician recommends and patient chooses bed at    Patient to be transferred to  on  [facility] [place] on 06/28/2013 Patient to be transferred to facility by ambulance Sharin Mons)  The following physician request were entered in Epic:   Additional Comments:   Jacklynn Lewis, MSW, LCSWA  Clinical  Social Work [phone]

## 2013-06-19 NOTE — Progress Notes (Signed)
Clinical Social Work Department BRIEF PSYCHOSOCIAL ASSESSMENT 06/19/2013  Patient:  Makayla [name], Makayla [name]     Account Number:  [id number]     Admit date:  06/12/2013  Clinical Social Worker:  Jacelyn Grip  Date/Time:  06/19/2013 10:30 AM  Referred by:  Physician  Date Referred:  06/19/2013 Referred for  SNF Placement   Other Referral:   Interview type:  Family Other interview type:    PSYCHOSOCIAL DATA Living Status:  FAMILY Admitted from facility:   Level of care:   Primary support name:  Makayla [name] and Makayla [name]/sons Primary support relationship to patient:  CHILD, ADULT Degree of support available:   strong    CURRENT CONCERNS Current Concerns  Post-Acute Placement   Other Concerns:    SOCIAL WORK ASSESSMENT / PLAN CSW received notification from MD that pt famiy is interested in exploring SNF placement as pt mental status has changed and pt sons do not feel like that would be able to manage pt at home at this time and requesting ST rehab.    CSW met with pt two adult sons in family room to discuss disposition planning. Pt sons discussed that they were hopeful to get pt home form hospital, but pt has declined in the past few days and they do not feel like pt would be able to manage at home. Pt sons plan for pt to return home after short term rehab placement. Pt sons agreeable to SNF search in [facility].    CSW provided emotional support to pt oldest son as he has hesitations that the pt may be discharged from the hospital too soon. CSW provided psycho-education about the care that SNF facilities provide.    CSW completed FL2 and initiated SNF search to [facility].    CSW to follow up with pt sons this afternoon to provide SNF bed offers.    CSW to continue to follow and facilitate pt discharge needs when pt medically ready for discharge.   Assessment/plan status:  Psychosocial Support/Ongoing Assessment of Needs Other assessment/ plan:   discharge planning    Information/referral to community resources:   [facility] list    PATIENT'S/FAMILY'S RESPONSE TO PLAN OF CARE: Pt oriented to person only at this time. Pt sons are supportive and actively involved in pt care and report that they have a strong family support system. Pt sons are coping as well as possible given pt recent change in mental status. CSW encouraged self care to pt son.       Jacklynn Lewis, MSW, LCSWA  Clinical Social Work [phone]

## 2013-06-19 NOTE — Progress Notes (Signed)
Physical Therapy Treatment Patient Details Name: Makayla [name] MRN: [id number] DOB: 1951-05-25 Today's Date: 06/19/2013 Time: 2952-8413 PT Time Calculation (min): 15 min  PT Assessment / Plan / Recommendation  History of Present Illness Pt has developed urinary retention and has had a general decline in status   PT Comments   Pt is willing to participate as much as she can and follows directions for ROM and sitting balance and standing attempts.  Pt is limited by generalized weakness and fatigue  Follow Up Recommendations  SNF     Does the patient have the potential to tolerate intense rehabilitation     Barriers to Discharge        Equipment Recommendations  None recommended by PT    Recommendations for Other Services    Frequency Min 3X/week   Progress towards PT Goals Progress towards PT goals: Goals downgraded-see care plan  Plan Discharge plan needs to be updated    Precautions / Restrictions Precautions Precautions: Fall;Other (comment) Precaution Comments: SOB with min exertion 2* Lung removal (per son) Restrictions Weight Bearing Restrictions: No   Pertinent Vitals/Pain Reports no pain    Mobility  Bed Mobility Bed Mobility: Supine to Sit;Sit to Supine Supine to Sit: 4: Min assist Sit to Supine: 3: Mod assist Details for Bed Mobility Assistance: pt needed more assist at end of sesion due to fatigue Transfers Transfers: Sit to Stand;Stand to Sit Sit to Stand: 3: Mod assist Stand to Sit: 3: Mod assist Details for Transfer Assistance: Pt only able to do one attempt due to weakness.  Legs not strong enough to stand a second time Ambulation/Gait Ambulation/Gait Assistance: Other (comment) (unable at this time) Assistive device: Rolling walker Gait Pattern: Step-to pattern;Step-through pattern;Trunk flexed General Gait Details: ltd by fatigue and elevated HR (135) Stairs: No    Exercises General Exercises - Upper Extremity Shoulder Flexion: AROM;Both;5  reps;Supine Elbow Extension: AROM;Both;Supine General Exercises - Lower Extremity Ankle Circles/Pumps: AROM;Both;5 reps;Supine Hip Flexion/Marching: AAROM;Both;5 reps;Supine   PT Diagnosis:    PT Problem List:   PT Treatment Interventions:     PT Goals (current goals can now be found in the care plan section)    Visit Information  Last PT Received On: 06/19/13 Assistance Needed: +1 History of Present Illness: Pt has developed urinary retention and has had a general decline in status    Subjective Data      Cognition  Cognition Arousal/Alertness: Lethargic (awakens easily, but keeps eyes closed) Behavior During Therapy: WFL for tasks assessed/performed Overall Cognitive Status: Within Functional Limits for tasks assessed (pt responds to commands)    Balance  Balance Balance Assessed: Yes Static Sitting Balance Static Sitting - Balance Support: Bilateral upper extremity supported;Feet supported Static Sitting - Level of Assistance: 5: Stand by assistance Static Standing Balance Static Standing - Balance Support: Bilateral upper extremity supported Static Standing - Level of Assistance: 3: Mod assist Static Standing - Comment/# of Minutes: pt only able to stand momentarily  End of Session PT - End of Session Activity Tolerance: Patient limited by fatigue Patient left: in bed;with family/visitor present Nurse Communication: Mobility status   GP    [name] K. Manson Passey, [place] [phone] 06/19/2013, 11:43 AM

## 2013-06-19 NOTE — Progress Notes (Signed)
[facility] PROGRESS NOTE  QUYEN CUTSFORTH RUE:[id number] DOB: 01-10-1951 DOA: 06/12/2013 PCP: [name], MD  Brief narrative: 62 year old female with past medical history of metastatic lung cancer who was admitted to [facility] ED on 06/12/2013 with acute respiratory failure secondary to flash pulmonary edema from atrial fibrillation with rapid ventricular response in the setting of acute pneumonia. She has been stable off BiPAP with initial plans to discharge 06/17/2013 however she has developed acute urinary retention of unclear etiology. She has failed attempts at voiding trials and at this point, urology consultation has been requested. Recommendation is to continue Foley cath with monthly changes.  Assessment/Plan:   Principal Problem:  Acute respiratory failure with hypoxia / SOB (shortness of breath)  - Secondary to HCAP and flash pulmonary edema in the setting of atrial fibrillation with rapid ventricular response. CT scan consistent with infection/possible aspiration. No evidence of pulmonary embolism. - Initially on vancomycin/Zosyn. Switched to oral doxycycline/Levaquin 06/16/13. - Patient initially required BiPAP but now has been off of BiPAP for past 4 days - Cardizem drip for atrial fibrillation is now discontinued. Patient is on Cardizem 180 mg daily by mouth - Patient received Lasix for diuresis. This is now discontinued - Pulmonology/critical care initially following patient. Signed off 06/14/2013.  HCAP (healthcare-associated pneumonia)  -Treated with vancomycin and Zosyn---now on oral doxycycline/Levaquin.   Active Problems:  Atrial fibrillation with rapid ventricular response - Continue Cardizem 180 mg daily Urinary retention with ARF  - Will require Foley catheter on discharge. Per urology Foley will have to be changed once a month - Creatinine trending down Hypokalemia  -  Repleted. 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 Resolved  with IVF.  Lung cancer with brain metastasis - Seen by [name] 06/13/2013.  - Current therapy: Xgeva 120 mg subcutaneously given on a monthly basis for bone metastasis status post 16 treatments.  - Recommend ongoing aggressive care with the ultimate referral to [facility] or [facility] for consideration of participation in clinical trials.  - Social work assisting discharge plan to skilled nursing facility per family wishes - Receiving whole brain radiation under the care of Dr. Michell Heinrich.  - Continue Decadron 4 mg IV daily Anemia of chronic disease - Secondary to history of malignancy and sequela of chemotherapy - Hemoglobin 10.5 and a no indications for transfusion Thrombocytopenia - Secondary to history of malignancy and sequela of chemotherapy - Platelet count 69 Scalp furuncle  - Draining pus. Pus sent for culture - re incubated for better growth. Wet to dry dressing changes.   Code Status: DNR  Family Communication: 2 sons updated at the bedside.  Disposition Plan: Home when stable.   Medical Consultants:  Dr. Max Fickle, Critical care  Dr. [name], Oncology Other Consultants:  None. Anti-infectives:  Vancomycin 06/12/2013--->06/16/13  Zosyn 06/12/2013--->06/16/13  Doxycycline 06/16/13--->  Levaquin 06/16/13--->   Manson Passey, MD  [facility] Pager [phone]  If 7PM-7AM, please contact night-coverage [email or web address] Password [email or web address] 06/19/2013, 12:06 PM   LOS: 7 days    HPI/Subjective: Congested this am, secretions in upper resp tract.   Objective: Filed Vitals:   06/18/13 0525 06/18/13 1245 06/18/13 2144 06/19/13 0615  BP: 127/88 132/93 138/102 128/96  Pulse: 123 123 120 128  Temp: 98.4 F (36.9 C) 97.6 F (36.4 C) 97.7 F (36.5 C) 97.5 F (36.4 C)  TempSrc: Oral Oral Oral Oral  Resp: 20 20 20 20   Height:  Weight:      SpO2: 99% 98% 99% 100%    Intake/Output Summary (Last 24 hours) at 06/19/13 1206 Last data  filed at 06/19/13 0615  Gross per 24 hour  Intake      0 ml  Output   1200 ml  Net  -1200 ml    Exam:   General:  Pt is sleeping, not in acute distress  Cardiovascular: Regular rate and rhythm, S1/S2 appreciated  Respiratory: upper lung lobes coarse breath sounds, congested  Abdomen: Soft, non tender, non distended, bowel sounds present, no guarding  Extremities: trace pitting edema, pulses DP and PT palpable bilaterally  Neuro: Grossly nonfocal  Data Reviewed: Basic Metabolic Panel:  Recent Labs Lab 06/14/13 0337 06/16/13 0418 06/17/13 0340 06/18/13 0409 06/19/13 0400  NA 131* 134* 135 138 142  K 3.6 3.1* 3.8 3.7 4.1  CL 98 98 101 104 109  CO2 23 25 23 22  16*  GLUCOSE 96 112* 110* 95 118*  BUN 13 17 28* 28* 32*  CREATININE 0.50 0.81 1.57* 1.55* 1.25*  CALCIUM 8.1* 7.8* 7.9* 7.8* 7.7*   Liver Function Tests:  Recent Labs Lab 06/12/13 1525  AST 12  ALT 20  ALKPHOS 100  BILITOT 0.6  PROT 6.0  ALBUMIN 2.9*   No results found for this basename: LIPASE, AMYLASE,  in the last 168 hours No results found for this basename: AMMONIA,  in the last 168 hours CBC:  Recent Labs Lab 06/12/13 1525 06/14/13 0337 06/16/13 0418 06/19/13 0400  WBC 13.9* 7.0 8.9 11.6*  NEUTROABS 12.8*  --   --   --   HGB 13.1 11.9* 10.8* 10.5*  HCT 37.2 33.9* 31.4* 32.6*  MCV 84.2 83.9 85.6 89.1  PLT 185 120* 116* 69*   Cardiac Enzymes: No results found for this basename: CKTOTAL, CKMB, CKMBINDEX, TROPONINI,  in the last 168 hours BNP: No components found with this basename: POCBNP,  CBG: No results found for this basename: GLUCAP,  in the last 168 hours  CULTURE, BLOOD (ROUTINE X 2)     Status: None   Collection Time    06/12/13  9:45 PM      Result Value Range Status   Specimen Description BLOOD RIGHT HAND   Final   Value: NO GROWTH 5 DAYS     Performed at [facility]   Report Status 06/19/2013 FINAL   Final  CULTURE, BLOOD (ROUTINE X 2)     Status: None    Collection Time    06/12/13 10:10 PM      Result Value Range Status   Specimen Description BLOOD RIGHT FOREARM   Final   Value: NO GROWTH 5 DAYS     Performed at [facility]   Report Status 06/19/2013 FINAL   Final  MRSA PCR SCREENING     Status: Abnormal   Collection Time    06/12/13 10:43 PM      Result Value Range Status   MRSA by PCR POSITIVE (*) NEGATIVE Final  WOUND CULTURE     Status: None   Collection Time    06/18/13 11:34 AM      Result Value Range Status   Specimen Description HEAD   Final   Special Requests Immunocompromise  Final   Gram Stain     Final   Value: RARE WBC PRESENT,BOTH PMN AND MONONUCLEAR     NO SQUAMOUS EPITHELIAL CELLS SEEN     RARE GRAM POSITIVE COCCI     IN  PAIRS     Performed at [facility]     Final   Value: Culture reincubated for better growth     Performed at [facility]   Report Status PENDING   Incomplete     Studies: US Renal 06/18/2013    IMPRESSION: No renal abnormality is identified.   Original Report Authenticated By: Onalee Hua Call    Scheduled Meds: . acyclovir ointment   Topical Q4H  . antiseptic oral rinse  15 mL Mouth Rinse q12n4p  . chlorhexidine  15 mL Mouth Rinse BID  . dexamethasone  4 mg Intravenous Daily  . diltiazem  180 mg Oral Daily  . doxycycline  100 mg Oral Q12H  . feeding supplement  1 Container Oral TID BM  . levofloxacin  750 mg Oral Q48H  . morphine  30 mg Oral Q8H

## 2013-06-19 NOTE — Progress Notes (Signed)
CSW assisting with d/c planning. SNF bed offers provided to family this afternoon. CSW will continue to follow to assist with d/c planning to SNF when stable.  Cori Razor LCSW

## 2013-06-19 NOTE — Progress Notes (Signed)
Patient assessed for PICC/ML, no suitable veins visualized with ultrasound, all too small.  PIV placed in left upper arm with 22G.  Bedside RN updated. Gerrad Welker, Lajean Manes, RN IV team

## 2013-06-19 NOTE — Progress Notes (Addendum)
Pt unable to currently take POs.  Right after transfer to ICU/SD unit, pt was anxious about Bipap.  I was going to give her Xanax 1mg  PO.  I attempted a small sip of water to see how she would do and she spit it back up.  So I did not give the Xanax.  I paged and talked with Devonne Doughty regarding Pt unable to take POs.  Medications reviewed.  Clydie Braun advised to hold all POs for the night and reassess pts ability to take POs in am.  If still unable to take POs discuss with rounding MD the meds that need to be changed.  Clydie Braun also advised to page if HR trends above 130 and she would order some IV Lopressor.  Nickola Major

## 2013-06-20 ENCOUNTER — Ambulatory Visit: Payer: Managed Care, Other (non HMO)

## 2013-06-20 DIAGNOSIS — N179 Acute kidney failure, unspecified: Secondary | ICD-10-CM

## 2013-06-20 LAB — BASIC METABOLIC PANEL
BUN: 41 mg/dL — ABNORMAL HIGH (ref 6–23)
Calcium: 7.9 mg/dL — ABNORMAL LOW (ref 8.4–10.5)
Creatinine, Ser: 1.48 mg/dL — ABNORMAL HIGH (ref 0.50–1.10)
GFR calc non Af Amer: 37 mL/min — ABNORMAL LOW (ref >90)
Glucose, Bld: 117 mg/dL — ABNORMAL HIGH (ref 70–99)

## 2013-06-20 LAB — CBC
MCH: 29.7 pg (ref 26.0–34.0)
MCHC: 33.3 g/dL (ref 30.0–36.0)
MCV: 89.1 fL (ref 78.0–100.0)
Platelets: 27 10*3/uL — CL (ref 150–400)

## 2013-06-20 MED ORDER — LORAZEPAM 2 MG/ML IJ SOLN
1.0000 mg | Freq: Every evening | INTRAMUSCULAR | Status: DC | PRN
  Administered 2013-06-22 – 2013-06-23 (×2): 1 mg via INTRAVENOUS
  Filled 2013-06-20 (×2): qty 1

## 2013-06-20 MED ORDER — LEVOFLOXACIN IN D5W 750 MG/150ML IV SOLN
750.0000 mg | INTRAVENOUS | Status: DC
  Administered 2013-06-20 – 2013-06-24 (×3): 750 mg via INTRAVENOUS
  Filled 2013-06-20 (×3): qty 150

## 2013-06-20 MED ORDER — LORAZEPAM 2 MG/ML IJ SOLN
INTRAMUSCULAR | Status: AC
  Administered 2013-06-20: 0.5 mg
  Filled 2013-06-20: qty 1

## 2013-06-20 MED ORDER — METOPROLOL TARTRATE 1 MG/ML IV SOLN
5.0000 mg | INTRAVENOUS | Status: DC | PRN
  Administered 2013-06-20 – 2013-06-22 (×5): 5 mg via INTRAVENOUS
  Filled 2013-06-20 (×5): qty 5

## 2013-06-20 MED ORDER — SODIUM CHLORIDE 0.9 % IV SOLN: 250.0000 mL | INTRAVENOUS | Status: DC | PRN

## 2013-06-20 MED ORDER — LORAZEPAM 2 MG/ML IJ SOLN: 0.5000 mg | Freq: Once | INTRAMUSCULAR | Status: DC

## 2013-06-20 MED ORDER — DOXYCYCLINE HYCLATE 100 MG IV SOLR
100.0000 mg | Freq: Two times a day (BID) | INTRAVENOUS | Status: DC
  Administered 2013-06-20 – 2013-06-23 (×8): 100 mg via INTRAVENOUS
  Filled 2013-06-20 (×9): qty 100

## 2013-06-20 MED ORDER — FUROSEMIDE 10 MG/ML IJ SOLN
40.0000 mg | Freq: Once | INTRAMUSCULAR | Status: AC
  Administered 2013-06-20: 40 mg via INTRAVENOUS
  Filled 2013-06-20: qty 4

## 2013-06-20 NOTE — Care Management Note (Signed)
    Page 1 of 2   06/20/2013     12:33:18 PM   CARE MANAGEMENT NOTE 06/20/2013  Patient:  SARIE, STALL   Account Number:  [id number]  Date Initiated:  06/13/2013  Documentation initiated by:  DAVIS,RHONDA  Subjective/Objective Assessment:   pt with hx of cardiac and resp problems     Action/Plan:   home   Anticipated DC Date:  06/21/2013   Anticipated DC Plan:  SKILLED NURSING FACILITY      DC Planning Services  CM consult      Choice offered to / List presented to:  C-1 Patient   DME arranged  NA      DME agency  NA     HH arranged  HH-2 PT  HH-4 NURSE'S AIDE      HH agency  [facility]   Status of service:  Completed, signed off Medicare Important Message given?   (If response is "NO", the following Medicare IM given date fields will be blank) Date Medicare IM given:   Date Additional Medicare IM given:    Discharge Disposition:    Per UR Regulation:  Reviewed for med. necessity/level of care/duration of stay  If discussed at Long Length of Stay Meetings, dates discussed:   06/20/2013    Comments:  06/20/13 Tawyna Pellot RN,BSN NCM [phone] TRANSFER FROM 3E.ARDS,PNA,AFIB W/RVR,URINARY RETNTION,LUNG CA W/BRAIN METS.DNR.IV ABX X2,IV LASIX,NEBS,IV DILAUDID Q2 PRN.PT-SNF.SW PROVIDED BED OFFERS.PALLIATIVE CONS IN AM.  Sharmon Leyden, RN Registered Nurse Signed CASE MANAGEMENT Care Management Note Service date: 06/17/2013 10:29 AM Cm spoke with patient at the bedside with adult sons present. Pt recommendation for HHPT. Per pt choice [facility] to provide [facility] services. [facility] rep Christina notified of referral. Pt's sons to assist in home care/24 hour supervision. Pt currently on 1L n/c during consult. Pt informed if unable to wean off oxygen, RN to assess eligibility for home o2.    [id number] Earlene Plater, RN, BSN, [place] [phone] Chart Reviewed for discharge and hospital needs. Discharge needs at time of review:  None Review of patient progress due on  [id number].

## 2013-06-20 NOTE — Progress Notes (Signed)
Critical lab called from lab of patients platelets of 27. MD Elisabeth Pigeon notified and made aware of situation. Will continue to monitor

## 2013-06-20 NOTE — Progress Notes (Signed)
NUTRITION FOLLOW UP  Intervention:   If oral intake does not improve, STRONGLY recommend initiation of nutrition support. If enteral nutrition desired, please consult RD. Continue Resource Breeze po TID, each supplement provides 250 kcal and 9 grams of protein and Magic Cup once daily. RD to continue to follow nutrition care plan.  Nutrition Dx:   Inadequate oral intake related to poor appetite and sore throat as evidenced by 10% wt loss in less than 6 months; ongoing.  Goal:   Pt to meet >/= 90% of their estimated nutrition needs; not met  Monitor:   Po intake, weight, labs  Assessment:   62 yo female with stage 4 lung cancer comes in with sob, worsening resp distress, cough. She has had extension of her cancer despite aggressive chemo/radiation and is actually undergoing evaluation to possibly start experimental tx in [name].   Urology consulted 9/16 for new urinary retention. Per MD, this is likely neurologic.  Pt developed respiratory and mental alertness decline on 9/17 and was transferred to step-down unit.  Sons at bedside. Per their report, pt has not eaten hardly anything x 3 days, not even Resource Breeze 2/2 mental status and need for increased respiratory support.   Pt is at high nutrition risk 2/2 chronic, catabolic illness and ongoing poor oral intake.  Height: Ht Readings from Last 1 Encounters:  06/19/13 5\' 3"  (1.6 m)    Weight Status:   Wt Readings from Last 1 Encounters:  06/19/13 138 lb 14.2 oz (63 kg)  Admit wt 129 lb  Re-estimated needs:  Kcal: 8295-6213 Protein: 75-95 g Fluid: 1.8-1.9 L  Skin: excoriated and red perineum  Diet Order: General   Intake/Output Summary (Last 24 hours) at 06/20/13 1037 Last data filed at 06/20/13 0826  Gross per 24 hour  Intake   1150 ml  Output    775 ml  Net    375 ml    Last BM: 9/17   Labs:   Recent Labs Lab 06/17/13 0340 06/18/13 0409 06/19/13 0400  NA 135 138 142  K 3.8 3.7 4.1  CL 101 104  109  CO2 23 22 16*  BUN 28* 28* 32*  CREATININE 1.57* 1.55* 1.25*  CALCIUM 7.9* 7.8* 7.7*  GLUCOSE 110* 95 118*    CBG (last 3)  No results found for this basename: GLUCAP,  in the last 72 hours  Scheduled Meds: . acyclovir ointment   Topical Q4H  . albuterol  2.5 mg Nebulization Q4H  . antiseptic oral rinse  15 mL Mouth Rinse q12n4p  . chlorhexidine  15 mL Mouth Rinse BID  . dexamethasone  4 mg Intravenous Daily  . diltiazem  180 mg Oral Daily  . doxycycline (VIBRAMYCIN) IV  100 mg Intravenous Q12H  . feeding supplement  1 Container Oral TID BM  . furosemide  40 mg Intravenous Daily  . ipratropium  0.5 mg Nebulization Q4H  . levofloxacin (LEVAQUIN) IV  750 mg Intravenous Q48H  . morphine  30 mg Oral Q8H  . sodium chloride  3 mL Intravenous Q12H    Continuous Infusions:  none  Jarold Motto MS, RD, LDN Pager: [phone] After-hours pager: [phone]

## 2013-06-20 NOTE — Progress Notes (Addendum)
[facility] PROGRESS NOTE  Makayla [name] OZH:[id number] DOB: 01-05-1951 DOA: 06/12/2013 PCP: [name], MD  Brief narrative: 62 year old female with past medical history of metastatic lung cancer who was admitted to [facility] ED on 06/12/2013 with acute respiratory failure secondary to flash pulmonary edema from atrial fibrillation with rapid ventricular response in the setting of acute pneumonia. She has been stable off BiPAP with initial plans to discharge 06/17/2013 however she has developed acute urinary retention of unclear etiology. She has failed attempts at voiding trials and urology recommending keeping foley in with change every month. Her hospital course has been complicated with acute respiratory distress and desaturation. She was transferred to SDU 06/19/2013 and has required BiPAP to keep O2 saturation above 90%. In addition, her CXR showed worsening airspace disease in left lung.  Assessment/Plan:   Principal Problem:  Acute respiratory failure with hypoxia / Sepsis  - Secondary to HCAP and flash pulmonary edema in the setting of atrial fibrillation with rapid ventricular response. CT scan consistent with infection/possible aspiration. No evidence of pulmonary embolism.  - Initially on vancomycin/Zosyn. Switched to doxycycline/Levaquin 06/16/13.  - due to worsening resp status pt transferred to SDU and repeat CXR with worsening left lung airspace disease. Antibiotics are now IV (levaquin and doxycycline) - Use BiPAP as needed to keep O2 saturation above 90% - use lasix 40 mg IV daily and stop IV fluids - give additional Lasix 40 mg IV as BP tolerates - Cardizem drip for atrial fibrillation is discontinued. Patient is on Cardizem 180 mg daily by mouth  - Pulmonology/critical care initially following patient. Signed off 06/14/2013.  Will re-consult as needed HCAP (healthcare-associated pneumonia)  - Treated with vancomycin and Zosyn---now on IV doxycycline/Levaquin.  Active Problems:   Atrial fibrillation with rapid ventricular response  - Continue Cardizem 180 mg daily  Urinary retention with ARF  - Will require Foley catheter on discharge. Per urology Foley will have to be changed once a month  - Creatinine ranges 1.25 - 1.48 Hypokalemia  - Repleted. 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 Resolved with IVF. Now slightly hypernatremic, 147 - follow up BMP in am; may resolve with IV lasix Lung cancer with brain metastasis  - Seen by [name] 06/13/2013.  - Current therapy: Xgeva 120 mg subcutaneously given on a monthly basis for bone metastasis status post 16 treatments.  - spoke with [name] who recommends continuing current care and if progressive decline will think about palliative care for goals of care - Initially, recommended ongoing aggressive care with the ultimate referral to [facility] or [facility] for consideration of participation in clinical trials.   - Receiving whole brain radiation under the care of Dr. Michell Heinrich.  - Continue Decadron 4 mg IV daily  Anemia of chronic disease  - Secondary to history of malignancy and sequela of chemotherapy  - Hemoglobin 11.7 and no indications for transfusion  Thrombocytopenia  - Secondary to history of malignancy and sequela of chemotherapy  - Platelet count 69  --> 27 - continue to monitor CBC Scalp furuncle  - Draining pus. Pus sent for culture - re incubated for better growth. Wet to dry dressing changes.   Code Status: DNR  Family Communication: 2 sons updated at the bedside.  Disposition Plan: Home when stable.    Medical Consultants:  Dr. Max Fickle, Critical care  Dr. [name], Oncology Other Consultants:  None. Anti-infectives:  Vancomycin 06/12/2013--->06/16/13  Zosyn 06/12/2013--->06/16/13  Doxycycline 06/16/13--->  Levaquin 06/16/13--->   Manson Passey, MD  [facility]  Pager [phone]   If 7PM-7AM, please  contact night-coverage [email or web address] Password [facility] 06/20/2013, 6:58 AM   LOS: 8 days    HPI/Subjective: Was agitated over the night with BiPAP. This am  Appears more comfortable.  Objective: Filed Vitals:   06/20/13 0000 06/20/13 0200 06/20/13 0400 06/20/13 0406  BP: 135/77 121/83 96/76 96/76   Pulse: 128 125 123 123  Temp: 97.9 F (36.6 C)  97.7 F (36.5 C)   TempSrc: Axillary  Axillary   Resp: 19 25 25 25   Height:      Weight:      SpO2: 100% 96% 97% 98%    Intake/Output Summary (Last 24 hours) at 06/20/13 0658 Last data filed at 06/20/13 0500  Gross per 24 hour  Intake    900 ml  Output    775 ml  Net    125 ml    Exam:   General:  Pt on BiPAP, no acute distress at this time  Cardiovascular: irregular rhythm, tachycardic, (+) S1 and S2  Respiratory: rhonchi in upper and mid lung lobes  Abdomen: Soft, non tender, non distended, bowel sounds present, no guarding  Extremities: No edema, pulses DP and PT palpable bilaterally  Neuro: Grossly nonfocal  Data Reviewed: Basic Metabolic Panel:  Recent Labs Lab 06/14/13 0337 06/16/13 0418 06/17/13 0340 06/18/13 0409 06/19/13 0400  NA 131* 134* 135 138 142  K 3.6 3.1* 3.8 3.7 4.1  CL 98 98 101 104 109  CO2 23 25 23 22  16*  GLUCOSE 96 112* 110* 95 118*  BUN 13 17 28* 28* 32*  CREATININE 0.50 0.81 1.57* 1.55* 1.25*  CALCIUM 8.1* 7.8* 7.9* 7.8* 7.7*   Liver Function Tests: No results found for this basename: AST, ALT, ALKPHOS, BILITOT, PROT, ALBUMIN,  in the last 168 hours No results found for this basename: LIPASE, AMYLASE,  in the last 168 hours No results found for this basename: AMMONIA,  in the last 168 hours CBC:  Recent Labs Lab 06/14/13 0337 06/16/13 0418 06/19/13 0400  WBC 7.0 8.9 11.6*  HGB 11.9* 10.8* 10.5*  HCT 33.9* 31.4* 32.6*  MCV 83.9 85.6 89.1  PLT 120* 116* 69*   Cardiac Enzymes: No results found for this basename: CKTOTAL, CKMB, CKMBINDEX, TROPONINI,  in the last 168  hours BNP: No components found with this basename: POCBNP,  CBG: No results found for this basename: GLUCAP,  in the last 168 hours  Recent Results (from the past 240 hour(s))  CULTURE, BLOOD (ROUTINE X 2)     Status: None   Collection Time    06/12/13  9:45 PM      Result Value Range Status   Specimen Description BLOOD RIGHT HAND   Final   Special Requests BOTTLES DRAWN AEROBIC ONLY   Final   Culture  Setup Time     Final   Value: 06/13/2013 02:21     Performed at [facility]   Culture     Final   Value: NO GROWTH 5 DAYS     Performed at [facility]   Report Status 06/19/2013 FINAL   Final  CULTURE, BLOOD (ROUTINE X 2)     Status: None   Collection Time    06/12/13 10:10 PM      Result Value Range Status   Specimen Description BLOOD RIGHT FOREARM   Final   Special Requests BOTTLES DRAWN  AEROBIC ONLY 1.5ML   Final   Culture  Setup Time     Final   Value: 06/13/2013 02:21     Performed at [facility]   Culture     Final   Value: NO GROWTH 5 DAYS     Performed at [facility]   Report Status 06/19/2013 FINAL   Final  MRSA PCR SCREENING     Status: Abnormal   Collection Time    06/12/13 10:43 PM      Result Value Range Status   MRSA by PCR POSITIVE (*) NEGATIVE Final   Comment:            The GeneXpert MRSA Assay (FDA     approved for NASAL specimens     only), is one component of a     comprehensive MRSA colonization     surveillance program. It is not     intended to diagnose MRSA     infection nor to guide or     monitor treatment for     MRSA infections.     RESULT CALLED TO, READ BACK BY AND VERIFIED WITH:     DENNY,C RN @ 0127 ON 9.11.2014 BY MCREYNOLDS,B  WOUND CULTURE     Status: None   Collection Time    06/18/13 11:34 AM      Result Value Range Status   Specimen Description HEAD   Final   Special Requests Immunocompromised   Final   Gram Stain     Final   Value: RARE WBC PRESENT,BOTH PMN AND MONONUCLEAR     NO  SQUAMOUS EPITHELIAL CELLS SEEN     RARE GRAM POSITIVE COCCI     IN PAIRS     Performed at [facility]   Culture     Final   Value: Culture reincubated for better growth     Performed at [facility]   Report Status PENDING   Incomplete     Studies: US Renal  06/18/2013   *RADIOLOGY REPORT*  Clinical Data: History of acute renal failure and urinary retention.  RENAL/URINARY TRACT ULTRASOUND COMPLETE  Comparison:  CT 06/18/2013.  Findings:  Right Kidney:  Right renal length is 10.8 cm.  Left Kidney:  Left renal length is 10.7 cm.  Examination of each kidney shows no evidence of hydronephrosis, solid or cystic mass, calculus, parenchymal loss, or parenchymal textural abnormality.  Bladder:  Urinary bladder is drained by a Foley catheter.  IMPRESSION: No renal abnormality is identified.   Original Report Authenticated By: Onalee Hua Call   Dg Chest Port 1 View  06/19/2013   *RADIOLOGY REPORT*  Clinical Data: Difficulty breathing.  PORTABLE CHEST - 1 VIEW  Comparison: 06/14/2013  Findings: Completeness opacification of the right hemithorax persists, consistent with right lung collapse.  Airspace disease in the left lung has progressed in the interval, compatible with worsening infection or edema. Imaged bony structures of the thorax are intact.  IMPRESSION: Worsening airspace disease in the left lung.   Original Report Authenticated By: Kennith Center, M.D.    Scheduled Meds: . acyclovir ointment   Topical Q4H  . albuterol  2.5 mg Nebulization Q4H  . antiseptic oral rinse  15 mL Mouth Rinse q12n4p  . chlorhexidine  15 mL Mouth Rinse BID  . dexamethasone  4 mg Intravenous Daily  . diltiazem  180 mg Oral Daily  . doxycycline  100 mg Oral Q12H  . feeding supplement  1 Container Oral TID BM  .  furosemide  40 mg Intravenous Daily  . ipratropium  0.5 mg Nebulization Q4H  . levofloxacin  750 mg Oral Q48H  . LORazepam  0.5 mg Intravenous Once  . morphine  30 mg Oral Q8H  . sodium  chloride  3 mL Intravenous Q12H   Continuous Infusions:

## 2013-06-20 NOTE — Progress Notes (Signed)
CSW met with pt sons in conference room.  CSW provided emotional support to pt sons.   Pt sons report that at this time plan to monitor pt for the next 24 hours and then decisions will be made from there re: plan of care.   CSW clarified questions and concerns.  CSW to follow up with pt sons tomorrow in order to assist with disposition needs as appropriate.  Jacklynn Lewis, MSW, LCSWA  Clinical Social Work [phone]

## 2013-06-20 NOTE — Progress Notes (Signed)
Stpped to see patient at request of nursing staff. Her sons were with her. They indicated that the family was contacting their pastor and that chaplains had stopped by yesterday. Told them we are available if needed, just to ask the nurse.

## 2013-06-21 ENCOUNTER — Inpatient Hospital Stay (HOSPITAL_COMMUNITY): Payer: Managed Care, Other (non HMO)

## 2013-06-21 ENCOUNTER — Ambulatory Visit: Payer: Managed Care, Other (non HMO)

## 2013-06-21 LAB — WOUND CULTURE

## 2013-06-21 LAB — BASIC METABOLIC PANEL
Chloride: 112 mEq/L (ref 96–112)
Creatinine, Ser: 1.66 mg/dL — ABNORMAL HIGH (ref 0.50–1.10)
GFR calc Af Amer: 37 mL/min — ABNORMAL LOW (ref >90)
GFR calc non Af Amer: 32 mL/min — ABNORMAL LOW (ref >90)
Potassium: 3.8 mEq/L (ref 3.5–5.1)

## 2013-06-21 LAB — CBC
MCHC: 32.9 g/dL (ref 30.0–36.0)
Platelets: 17 10*3/uL — CL (ref 150–400)
RDW: 17.2 % — ABNORMAL HIGH (ref 11.5–15.5)
WBC: 15.9 10*3/uL — ABNORMAL HIGH (ref 4.0–10.5)

## 2013-06-21 NOTE — Progress Notes (Signed)
Pt c/o of severe pain in her bilateral toes, pt feels the pain when toes are touched, according to sons this is a new development, will notify rounding MD in the morning.  Bilateral foot is warm to touch, 1+ pulses is felt, some cyanosis is also noted on bilateral  toes and fingers.

## 2013-06-21 NOTE — Progress Notes (Signed)
PT Cancellation Note  Patient Details Name: Makayla [name] MRN: [id number] DOB: Sep 20, 1951   Cancelled Treatment:    Reason Eval/Treat Not Completed: Medical issues which prohibited therapy. Will check back another day. thanks   Rebeca Alert, MPT Pager: [phone]

## 2013-06-21 NOTE — Plan of Care (Signed)
Problem: Phase II Progression Outcomes Goal: O2 sats > equal to 90% on RA or at baseline Outcome: Not Progressing Dependent on bipap Goal: Tolerating diet Outcome: Not Progressing Not able to take po's safely

## 2013-06-21 NOTE — Progress Notes (Addendum)
[facility] PROGRESS NOTE  Makayla [name] ION:[id number] DOB: 02/14/1951 DOA: 06/12/2013 PCP: [name], MD  Brief narrative: 62 year old female with past medical history of metastatic lung cancer who was admitted to [facility] ED on 06/12/2013 with acute respiratory failure secondary to flash pulmonary edema from atrial fibrillation with rapid ventricular response in the setting of acute pneumonia. She has been stable off BiPAP with initial plans to discharge 06/17/2013 however she has developed acute urinary retention of unclear etiology. She has failed attempts at voiding trials and urology recommending keeping foley in with change every month. Her hospital course has been complicated with acute respiratory distress and desaturation. She was transferred to SDU 06/19/2013 and has required BiPAP to keep O2 saturation above 90%. CXR shows complete consolidation of right hemithorax with mediastinal shift to the right.  Assessment/Plan:   Principal Problem:  Acute respiratory failure with hypoxia / Sepsis  - Secondary to progression of lung disease, HCAP - initial CT scan consistent with infection/possible aspiration. No evidence of pulmonary embolism.  - Initially on vancomycin/Zosyn. Switched to oral doxycycline/Levaquin 06/16/13 which is now IV due to poor mental status and poor oral intake - of note, pt transferred to SDU due to worsening resp status - onBiPAP as needed to keep O2 saturation above 90%  - lasix 40 mg IV daily with no significant changes on CXR since past 48 hours  - Cardizem drip for atrial fibrillation is discontinued. Patient is on Cardizem 180 mg daily by mouth as able to tolerate PO intake.  HR 109- 124 - Pulmonology/critical care initially following patient. Signed off 06/14/2013. Will re-consult as needed  HCAP (healthcare-associated pneumonia)  - Treated with vancomycin and Zosyn---now on IV doxycycline/Levaquin.  Active Problems:  Atrial fibrillation with rapid ventricular  response  - as tolerates Cardizem 180 mg daily  Urinary retention with ARF  - has foley - Creatinine ranges 1.25 - 1.66 Hypokalemia  - Repleted. 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 Resolved with IVF. Now slightly hypernatremic, 147 -->149 Lung cancer with brain metastasis  - Seen by [name] 06/13/2013.  - Current therapy: Xgeva 120 mg subcutaneously given on a monthly basis for bone metastasis status post 16 treatments.  - spoke with [name] who recommends continuing current care and if progressive decline will think about palliative care for goals of care  - Initially, recommended ongoing aggressive care with the ultimate referral to [facility] or [facility] for consideration of participation in clinical trials.  - Receiving whole brain radiation under the care of Dr. Michell Heinrich.  - Continue Decadron 4 mg IV daily  Anemia of chronic disease  - Secondary to history of malignancy and sequela of chemotherapy  - Hemoglobin 10.5 and no indications for transfusion  Thrombocytopenia  - Secondary to history of malignancy and sequela of chemotherapy  - Platelet count 69 --> 27 -->17 - continue to monitor CBC  Scalp furuncle  - Draining pus. Pus sent for culture - re incubated for better growth. Wet to dry dressing changes.    Code Status: DNR  Family Communication: 2 sons updated at the bedside.  Disposition Plan: pending due to acute illness  Medical Consultants:  Dr. Max Fickle, Critical care  Dr. [name], Oncology Other Consultants:  None. Anti-infectives:  Vancomycin 06/12/2013--->06/16/13  Zosyn 06/12/2013--->06/16/13  Doxycycline 06/16/13--->  Levaquin 06/16/13--->  Manson Passey, MD  [facility]  Pager [phone]   If 7PM-7AM, please contact  night-coverage [email or web address] Password [email or web address] 06/21/2013, 7:22 AM   LOS: 9 days    HPI/Subjective: No acute overnight  events.  Objective: Filed Vitals:   06/21/13 0200 06/21/13 0400 06/21/13 0409 06/21/13 0600  BP: 121/91 117/80  99/71  Pulse:      Temp:  97 F (36.1 C)    TempSrc:  Axillary    Resp: 24 24  17   Height:      Weight:      SpO2: 99% 74% 100% 100%    Intake/Output Summary (Last 24 hours) at 06/21/13 0722 Last data filed at 06/21/13 0500  Gross per 24 hour  Intake    730 ml  Output   1330 ml  Net   -600 ml    Exam:   General:  Pt on BiPAP  Cardiovascular: regular rhythm, tachycardic, (+) S1 and S2  Respiratory: congested with rhonchi in upper and mid lung lobes  Abdomen: Soft, non tender, non distended, bowel sounds present, no guarding  Extremities: No edema, pulses DP and PT palpable bilaterally; finger and toes cyanosis  Neuro: Grossly nonfocal  Data Reviewed: Basic Metabolic Panel:  Recent Labs Lab 06/17/13 0340 06/18/13 0409 06/19/13 0400 06/20/13 1130 06/21/13 0515  NA 135 138 142 147* 149*  K 3.8 3.7 4.1 4.4 3.8  CL 101 104 109 111 112  CO2 23 22 16* 17* 22  GLUCOSE 110* 95 118* 117* 98  BUN 28* 28* 32* 41* 49*  CREATININE 1.57* 1.55* 1.25* 1.48* 1.66*  CALCIUM 7.9* 7.8* 7.7* 7.9* 8.0*   Liver Function Tests: No results found for this basename: AST, ALT, ALKPHOS, BILITOT, PROT, ALBUMIN,  in the last 168 hours No results found for this basename: LIPASE, AMYLASE,  in the last 168 hours No results found for this basename: AMMONIA,  in the last 168 hours CBC:  Recent Labs Lab 06/16/13 0418 06/19/13 0400 06/20/13 1130 06/21/13 0515  WBC 8.9 11.6* 16.5* 15.9*  HGB 10.8* 10.5* 11.7* 10.5*  HCT 31.4* 32.6* 35.1* 31.9*  MCV 85.6 89.1 89.1 89.1  PLT 116* 69* 27* 17*   Cardiac Enzymes: No results found for this basename: CKTOTAL, CKMB, CKMBINDEX, TROPONINI,  in the last 168 hours BNP: No components found with this basename: POCBNP,  CBG: No results found for this basename: GLUCAP,  in the last 168 hours  CULTURE, BLOOD (ROUTINE X 2)      Status: None   Collection Time    06/12/13  9:45 PM      Result Value Range Status   Specimen Description BLOOD RIGHT HAND   Final   Value: NO GROWTH 5 DAYS     Performed at [facility]   Report Status 06/19/2013 FINAL   Final  CULTURE, BLOOD (ROUTINE X 2)     Status: None   Collection Time    06/12/13 10:10 PM      Result Value Range Status   Specimen Description BLOOD RIGHT FOREARM   Final   Value: NO GROWTH 5 DAYS     Performed at [facility]   Report Status 06/19/2013 FINAL   Final  MRSA PCR SCREENING     Status: Abnormal   Collection Time    06/12/13 10:43 PM      Result Value Range Status   MRSA by PCR POSITIVE (*) NEGATIVE Final  WOUND CULTURE     Status: None   Collection Time    06/18/13 11:34 AM      Result Value Range Status  Specimen Description HEAD   Final   Special Requests Immunocompromised   Final   Gram Stain     Final   Value: RARE WBC PRESENT,BOTH PMN AND MONONUCLEAR     NO SQUAMOUS EPITHELIAL CELLS SEEN     RARE GRAM POSITIVE COCCI     IN PAIRS     Performed at [facility]   Culture     Final   Value: Culture reincubated for better growth     Performed at [facility]   Report Status PENDING   Incomplete     Studies: Dg Chest Port 1 View 06/19/2013   * IMPRESSION: Worsening airspace disease in the left lung.   Original Report Authenticated By: Kennith Center, M.D.    Scheduled Meds: . acyclovir ointment   Topical Q4H  . albuterol  2.5 mg Nebulization Q4H  . dexamethasone  4 mg Intravenous Daily  . diltiazem  180 mg Oral Daily  . doxycycline   100 mg Intravenous Q12H  . feeding supplement  1 Container Oral TID BM  . furosemide  40 mg Intravenous Daily  . ipratropium  0.5 mg Nebulization Q4H  . levofloxacin (LEVAQUIN)   750 mg Intravenous Q48H  . morphine  30 mg Oral Q8H

## 2013-06-21 NOTE — Progress Notes (Signed)
CSW continuing to follow.  Pt disposition planning is currently pending due to acute illness.  CSW to continue to follow and assist with pt disposition planning as appropriate.  Jacklynn Lewis, MSW, LCSWA  Clinical Social Work [phone]

## 2013-06-22 LAB — CBC
HCT: 28.6 % — ABNORMAL LOW (ref 36.0–46.0)
Hemoglobin: 9.5 g/dL — ABNORMAL LOW (ref 12.0–15.0)
MCHC: 33.2 g/dL (ref 30.0–36.0)
RDW: 17.6 % — ABNORMAL HIGH (ref 11.5–15.5)
WBC: 16.7 10*3/uL — ABNORMAL HIGH (ref 4.0–10.5)

## 2013-06-22 LAB — BASIC METABOLIC PANEL
BUN: 56 mg/dL — ABNORMAL HIGH (ref 6–23)
Chloride: 112 mEq/L (ref 96–112)
GFR calc Af Amer: 36 mL/min — ABNORMAL LOW (ref >90)
GFR calc non Af Amer: 31 mL/min — ABNORMAL LOW (ref >90)
Potassium: 3.3 mEq/L — ABNORMAL LOW (ref 3.5–5.1)
Sodium: 147 mEq/L — ABNORMAL HIGH (ref 135–145)

## 2013-06-22 MED ORDER — POTASSIUM CHLORIDE 10 MEQ/100ML IV SOLN
10.0000 meq | INTRAVENOUS | Status: AC
  Administered 2013-06-22 (×3): 10 meq via INTRAVENOUS
  Filled 2013-06-22 (×3): qty 100

## 2013-06-22 MED ORDER — FUROSEMIDE 10 MG/ML IJ SOLN
20.0000 mg | Freq: Every day | INTRAMUSCULAR | Status: DC
  Administered 2013-06-22 – 2013-06-28 (×7): 20 mg via INTRAVENOUS
  Filled 2013-06-22 (×6): qty 2

## 2013-06-22 NOTE — Progress Notes (Signed)
Critical result (Platelet 15) called to [facility] Floor coverage @ 0500, no new order received.

## 2013-06-22 NOTE — Plan of Care (Signed)
Problem: Phase II Progression Outcomes Goal: Tolerating diet Outcome: Not Progressing refusing all po intake

## 2013-06-22 NOTE — Progress Notes (Addendum)
[facility] PROGRESS NOTE  VERENICE WESTRICH ZOX:[id number] DOB: [birth year] DOA: 06/12/2013 PCP: [name], MD  Brief narrative: 62 year old female with past medical history of metastatic lung cancer who was admitted to [facility] ED on 06/12/2013 with acute respiratory failure secondary to flash pulmonary edema from atrial fibrillation with rapid ventricular response in the setting of acute pneumonia. She has been stable off BiPAP with initial plans to discharge 06/17/2013 however she has developed acute urinary retention of unclear etiology. She has failed attempts at voiding trials and urology recommending keeping foley in with change every month. Her hospital course has been complicated with acute respiratory distress and desaturation. She was transferred to SDU 06/19/2013 and has required BiPAP to keep O2 saturation above 90%. CXR shows complete consolidation of right hemithorax with mediastinal shift to the right. No significant changes in respiratory status in past 48 hours. Pt still requires BiPAP to maintain oxygen saturation above 90%.  Assessment/Plan:   Principal Problem:  Acute respiratory failure with hypoxia / Sepsis  - Secondary to progression of lung disease, HCAP  -Iinitial CT scan consistent with infection/possible aspiration. No evidence of pulmonary embolism.  - Initially on vancomycin/Zosyn. Switched to oral doxycycline/Levaquin 06/16/13 which is now IV due to poor mental status and poor oral intake  - patient requires BiPAP most of the day  to keep O2 saturation above 90%  - lasix reduced from 40 mg IV daily to 20 mg IV daily due to worsening renal function. Moreover, there is no significant changes on repeat CXR - pt initially required cardizem drip for atrial fibrillation but this has now been discontinued. Patient is on Cardizem 180 mg daily by mouth as able to tolerate PO intake.  - Pulmonology/critical care initially following patient. Signed off 06/14/2013. Will re-consult as  needed  HCAP (healthcare-associated pneumonia)  - Treated with vancomycin and Zosyn---now on IV doxycycline/Levaquin.  Active Problems:  Atrial fibrillation with rapid ventricular response  - as tolerates Cardizem 180 mg daily  Urinary retention with ARF  - has foley cath in place - Creatinine 1.25 -> 1.66 --> 1.72  Hypokalemia  - Repleted. 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 Resolved with IVF. Now slightly hypernatremic, 147 -->149  --> 147 Lung cancer with brain metastasis  - Seen by [name] 06/13/2013.  - Current therapy: Xgeva 120 mg subcutaneously given on a monthly basis for bone metastasis status post 16 treatments. Initially, recommended ongoing aggressive care with the ultimate referral to [facility] or [facility] for consideration of participation in clinical trials. - spoke with [name] who recommends continuing current care; if no improvement will call palliative care for GOC - Received whole brain radiation under the care of Dr. Michell Heinrich.  - Continue Decadron 4 mg IV daily  Anemia of chronic disease  - Secondary to history of malignancy and sequela of chemotherapy  - Hemoglobin 9.5 and no indications for transfusion  Thrombocytopenia  - Secondary to history of malignancy and sequela of chemotherapy  - Platelet count 69 --> 27 -->17 --> 15 - continue to monitor CBC  Scalp furuncle  - Draining pus. Pus sent for culture - re incubated for better growth. Wet to dry dressing changes.    Code Status: DNR/DNI Family Communication: 2 sons updated at the bedside.  Disposition Plan: pending due to acute illness   Medical Consultants:  Dr. Max Fickle, Critical care  Dr. [name], Oncology Other Consultants:  None. Anti-infectives:  Vancomycin 06/12/2013--->06/16/13  Zosyn 06/12/2013--->06/16/13  Doxycycline 06/16/13--->  Levaquin 06/16/13--->  Manson Passey, MD  [facility]  Pager  [phone]   If 7PM-7AM, please contact night-coverage [email or web address] Password [facility] 06/22/2013, 7:35 AM   LOS: 10 days    HPI/Subjective: Still on BiPAP.  Objective: Filed Vitals:   06/22/13 0318 06/22/13 0400 06/22/13 0405 06/22/13 0600  BP:  121/74  113/57  Pulse:  30  102  Temp:   98.4 F (36.9 C)   TempSrc:   Axillary   Resp:  20  19  Height:      Weight:   58.9 kg (129 lb 13.6 oz)   SpO2: 100% 97%  100%    Intake/Output Summary (Last 24 hours) at 06/22/13 0735 Last data filed at 06/22/13 0600  Gross per 24 hour  Intake    730 ml  Output   1425 ml  Net   -695 ml    Exam:   General:  Pt on BiPAP, no distress  Cardiovascular: Regular rate and rhythm, S1/S2 appreciated  Respiratory: wheezing in mid and upper lung lobes; no air movement on right  Abdomen: Soft, non tender, non distended, bowel sounds present, no guarding  Extremities: No edema, pulses DP and PT palpable bilaterally; finger and toes cyanosis improving  Neuro: Grossly nonfocal  Data Reviewed: Basic Metabolic Panel:  Recent Labs Lab 06/18/13 0409 06/19/13 0400 06/20/13 1130 06/21/13 0515 06/22/13 0400  NA 138 142 147* 149* 147*  K 3.7 4.1 4.4 3.8 3.3*  CL 104 109 111 112 112  CO2 22 16* 17* 22 21  GLUCOSE 95 118* 117* 98 113*  BUN 28* 32* 41* 49* 56*  CREATININE 1.55* 1.25* 1.48* 1.66* 1.72*  CALCIUM 7.8* 7.7* 7.9* 8.0* 7.8*   Liver Function Tests: No results found for this basename: AST, ALT, ALKPHOS, BILITOT, PROT, ALBUMIN,  in the last 168 hours No results found for this basename: LIPASE, AMYLASE,  in the last 168 hours No results found for this basename: AMMONIA,  in the last 168 hours CBC:  Recent Labs Lab 06/16/13 0418 06/19/13 0400 06/20/13 1130 06/21/13 0515 06/22/13 0400  WBC 8.9 11.6* 16.5* 15.9* 16.7*  HGB 10.8* 10.5* 11.7* 10.5* 9.5*  HCT 31.4* 32.6* 35.1* 31.9* 28.6*  MCV 85.6 89.1 89.1 89.1 88.3  PLT 116* 69* 27* 17* 15*   Cardiac Enzymes: No results  found for this basename: CKTOTAL, CKMB, CKMBINDEX, TROPONINI,  in the last 168 hours BNP: No components found with this basename: POCBNP,  CBG: No results found for this basename: GLUCAP,  in the last 168 hours  Recent Results (from the past 240 hour(s))  CULTURE, BLOOD (ROUTINE X 2)     Status: None   Collection Time    06/12/13  9:45 PM      Result Value Range Status   Specimen Description BLOOD RIGHT HAND   Final   Special Requests BOTTLES DRAWN AEROBIC ONLY   Final   Culture  Setup Time     Final   Value: 06/13/2013 02:21     Performed at [facility]   Culture     Final   Value: NO GROWTH 5 DAYS     Performed at [facility]   Report Status 06/19/2013 FINAL   Final  CULTURE, BLOOD (ROUTINE X 2)     Status: None   Collection Time    06/12/13 10:10 PM      Result Value Range Status  Specimen Description BLOOD RIGHT FOREARM   Final   Special Requests BOTTLES DRAWN AEROBIC ONLY 1.5ML   Final   Culture  Setup Time     Final   Value: 06/13/2013 02:21     Performed at [facility]   Culture     Final   Value: NO GROWTH 5 DAYS     Performed at [facility]   Report Status 06/19/2013 FINAL   Final  MRSA PCR SCREENING     Status: Abnormal   Collection Time    06/12/13 10:43 PM      Result Value Range Status   MRSA by PCR POSITIVE (*) NEGATIVE Final   Comment:            The GeneXpert MRSA Assay (FDA     approved for NASAL specimens     only), is one component of a     comprehensive MRSA colonization     surveillance program. It is not     intended to diagnose MRSA     infection nor to guide or     monitor treatment for     MRSA infections.     RESULT CALLED TO, READ BACK BY AND VERIFIED WITH:     DENNY,C RN @ 0127 ON 9.11.2014 BY MCREYNOLDS,B  WOUND CULTURE     Status: None   Collection Time    06/18/13 11:34 AM      Result Value Range Status   Specimen Description HEAD   Final   Special Requests Immunocompromised   Final   Gram  Stain     Final   Value: RARE WBC PRESENT,BOTH PMN AND MONONUCLEAR     NO SQUAMOUS EPITHELIAL CELLS SEEN     RARE GRAM POSITIVE COCCI     IN PAIRS     Performed at [facility]   Culture     Final   Value: FEW METHICILLIN RESISTANT STAPHYLOCOCCUS AUREUS     Note: RIFAMPIN AND GENTAMICIN SHOULD NOT BE USED AS SINGLE DRUGS FOR TREATMENT OF STAPH INFECTIONS. CRITICAL RESULT CALLED TO, READ BACK BY AND VERIFIED WITH: RUBY JOHNSON BY INGRAM A 06/21/13 11AM     Performed at [facility]   Report Status 06/21/2013 FINAL   Final   Organism ID, Bacteria METHICILLIN RESISTANT STAPHYLOCOCCUS AUREUS   Final     Studies: Dg Chest Port 1 View  06/21/2013   CLINICAL DATA:  Lung collapse.  EXAM: PORTABLE CHEST - 1 VIEW  COMPARISON:  06/19/2013 chest x-ray. 06/12/2013 CT.  FINDINGS: Complete consolidation of the right hemithorax with mediastinal shift to the right. Obstructing lesion may be present.  Diffuse left lung airspace disease. Etiology indeterminate. This may represent infectious infiltrate. Pulmonary vascular congestion/ shunting of blood may partially contribute to this appearance. Interstitial spread of tumor not excluded.  Left-sided pleural effusion may be present. No gross left-sided pneumothorax.  Osseous metastatic disease.  IMPRESSION: Complete consolidation of the right hemithorax with mediastinal shift to the right. Obstructing lesion may be present.  Diffuse left lung airspace disease. Etiology indeterminate. This may represent infectious infiltrate. Pulmonary vascular congestion/ shunting of blood may partially contribute to this appearance. Interstitial spread of tumor not excluded.  Left-sided pleural effusion may be present.  Osseous metastatic disease.   Electronically Signed   By: Bridgett Larsson   On: 06/21/2013 10:47    Scheduled Meds: . acyclovir ointment   Topical Q4H  . albuterol  2.5 mg Nebulization Q4H  . antiseptic  oral rinse  15 mL Mouth Rinse q12n4p  .  chlorhexidine  15 mL Mouth Rinse BID  . dexamethasone  4 mg Intravenous Daily  . diltiazem  180 mg Oral Daily  . doxycycline (VIBRAMYCIN) IV  100 mg Intravenous Q12H  . feeding supplement  1 Container Oral TID BM  . furosemide  20 mg Intravenous Daily  . ipratropium  0.5 mg Nebulization Q4H  . levofloxacin (LEVAQUIN)   750 mg Intravenous Q48H  . morphine  30 mg Oral Q8H  . potassium chloride  10 mEq Intravenous Q1 Hr x 3

## 2013-06-23 ENCOUNTER — Inpatient Hospital Stay (HOSPITAL_COMMUNITY): Payer: Managed Care, Other (non HMO)

## 2013-06-23 LAB — CBC
HCT: 29 % — ABNORMAL LOW (ref 36.0–46.0)
Hemoglobin: 9.5 g/dL — ABNORMAL LOW (ref 12.0–15.0)
MCHC: 32.8 g/dL (ref 30.0–36.0)
RBC: 3.21 MIL/uL — ABNORMAL LOW (ref 3.87–5.11)
WBC: 18.4 10*3/uL — ABNORMAL HIGH (ref 4.0–10.5)

## 2013-06-23 LAB — BASIC METABOLIC PANEL
BUN: 59 mg/dL — ABNORMAL HIGH (ref 6–23)
CO2: 25 mEq/L (ref 19–32)
Chloride: 113 mEq/L — ABNORMAL HIGH (ref 96–112)
GFR calc non Af Amer: 31 mL/min — ABNORMAL LOW (ref >90)
Glucose, Bld: 120 mg/dL — ABNORMAL HIGH (ref 70–99)
Potassium: 3.9 mEq/L (ref 3.5–5.1)
Sodium: 147 mEq/L — ABNORMAL HIGH (ref 135–145)

## 2013-06-23 MED ORDER — DILTIAZEM HCL ER COATED BEADS 240 MG PO CP24
240.0000 mg | ORAL_CAPSULE | Freq: Every day | ORAL | Status: DC
  Filled 2013-06-23 (×3): qty 1

## 2013-06-23 MED ORDER — METOPROLOL TARTRATE 1 MG/ML IV SOLN
10.0000 mg | INTRAVENOUS | Status: DC | PRN
  Filled 2013-06-23: qty 10

## 2013-06-23 NOTE — Progress Notes (Signed)
Critical Result: (platelets 10) [facility] Floor Coverage called at 5:46 and made aware.  Pt also has had a decrease in u/o also.  No new orders received. Conley Rolls, RN

## 2013-06-23 NOTE — Progress Notes (Signed)
  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   Narrative: The patient tolerated radiation treatment relatively well initially. She had resolution of her back pain.  She then developed pneumonia, was hospitalized, rapidly declined and was discharged to hospice.  She died at home.   Plan: The patient has completed radiation treatment.  ------------------------------------------------  Lurline Hare, MD

## 2013-06-23 NOTE — Progress Notes (Addendum)
[facility] PROGRESS NOTE  Makayla [name]:[id number] DOB: 08-Oct-1950 DOA: 06/12/2013 PCP: [name], MD  Brief narrative: 62 year old female with past medical history of metastatic lung cancer who was admitted to [facility] ED on 06/12/2013 with acute respiratory failure secondary to flash pulmonary edema from atrial fibrillation with rapid ventricular response in the setting of acute pneumonia. She has been stable off BiPAP with initial plans to discharge 06/17/2013 however she has developed acute urinary retention of unclear etiology. She has failed attempts at voiding trials and urology recommending keeping foley in with change every month. Her hospital course has been complicated with acute respiratory distress and desaturation. She was transferred to SDU 06/19/2013 and has required BiPAP to keep O2 saturation above 90%. CXR showed complete consolidation of right hemithorax with mediastinal shift to the right. In past 24 hours pt had also used ventimask and BiPAP as needed.  Assessment/Plan:   Principal Problem:  Acute respiratory failure with hypoxia / Sepsis  - Secondary to progression of lung disease, HCAP; initially on vanco and zosyn and now on doxycycline and Levaquin from 06/16/2013 - Iinitial CT scan consistent with infection/possible aspiration. No evidence of pulmonary embolism. We have done CXR every other day and no significant improvement seen on CXR. Repeat CXR today - pt now on as needed BiPAP to keep O2 saturation above 90% - will continue lasix 20 mg IV daily; monitor renal function; creatinine stable with slight trend downward. - Pulmonology/critical care initially following patient. Signed off 06/14/2013. Will re-consult as needed  HCAP (healthcare-associated pneumonia)  - Treated with vancomycin and Zosyn---now on IV doxycycline/Levaquin.  - aspiration precaution - SLP eval ordered Active Problems:  Atrial fibrillation with rapid ventricular response  - Pt does not  tolerate PO intake and coughs every time with small sips of water - high risk of aspiration - use metoprolol 10 mg IV PRN every 6 hours for SBP above 130 and/or HR above100 Urinary retention with ARF  - has foley cath in place  - Creatinine 1.25 -> 1.66 --> 1.72 --> 1.70 Hypokalemia  - Repleted.  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 Resolved with IVF. Now slightly hypernatremic, 147 -->149 --> 147  Lung cancer with brain metastasis  - Seen by [name] 06/13/2013.  - Current therapy: Xgeva 120 mg subcutaneously given on a monthly basis for bone metastasis status post 16 treatments. Initially, recommended ongoing aggressive care with the ultimate referral to [facility] or [facility] for consideration of participation in clinical trials.  - spoke with [name] who recommends continuing current care; if no improvement will call palliative care for GOC  - Received whole brain radiation under the care of Dr. Michell Heinrich.  - Continue Decadron 4 mg IV daily  Anemia of chronic disease  - Secondary to history of malignancy and sequela of chemotherapy  - Hemoglobin stable at 9.5 and no indications for transfusion  Leukocytosis - likely due to HCAP as well as decadron - continue to monitor Thrombocytopenia  - Secondary to history of malignancy and sequela of chemotherapy  - Platelet count 69 --> 27 -->17 --> 10; no active bleed; will transfuse with less than 10 platelet count - continue to monitor CBC  Scalp furuncle  - Draining pus. Pus sent for culture - re incubated for better growth. Wet to dry dressing changes.    Code Status: DNR/DNI  Family Communication: 2 sons updated at the bedside.  Disposition Plan: pending  due to acute illness    Medical Consultants:  Dr. Max Fickle, Critical care  Dr. [name], Oncology Other Consultants:  None. Anti-infectives:  Vancomycin 06/12/2013--->06/16/13  Zosyn 06/12/2013--->06/16/13   Doxycycline 06/16/13--->  Levaquin 06/16/13--->   Manson Passey, MD  [facility]  Pager [phone]    If 7PM-7AM, please contact night-coverage [email or web address] Password [email or web address] 06/23/2013, 7:24 AM   LOS: 11 days     HPI/Subjective: No overnight events.  Objective: Filed Vitals:   06/23/13 0005 06/23/13 0200 06/23/13 0312 06/23/13 0400  BP:    131/88  Pulse:  116  118  Temp: 97.5 F (36.4 C)   98.1 F (36.7 C)  TempSrc: Axillary   Axillary  Resp:  19  20  Height:      Weight:      SpO2:  100% 100% 100%    Intake/Output Summary (Last 24 hours) at 06/23/13 0724 Last data filed at 06/23/13 0600  Gross per 24 hour  Intake    880 ml  Output   1100 ml  Net   -220 ml    Exam:   General:  Pt is alert,  not in acute distress  Cardiovascular: Regular rhythm, tachycardic, S1/S2, no murmurs, no rubs, no gallops  Respiratory: coarse breath sounds bilaterally, less wheezing today; no air movement on right  Abdomen: Soft, non tender, non distended, bowel sounds present, no guarding  Extremities: trace LE edema, pulses DP and PT palpable bilaterally; toe and finger cyanosis improving  Neuro: Grossly nonfocal  Data Reviewed: Basic Metabolic Panel:  Recent Labs Lab 06/19/13 0400 06/20/13 1130 06/21/13 0515 06/22/13 0400 06/23/13 0345  NA 142 147* 149* 147* 147*  K 4.1 4.4 3.8 3.3* 3.9  CL 109 111 112 112 113*  CO2 16* 17* 22 21 25   GLUCOSE 118* 117* 98 113* 120*  BUN 32* 41* 49* 56* 59*  CREATININE 1.25* 1.48* 1.66* 1.72* 1.70*  CALCIUM 7.7* 7.9* 8.0* 7.8* 7.9*   Liver Function Tests: No results found for this basename: AST, ALT, ALKPHOS, BILITOT, PROT, ALBUMIN,  in the last 168 hours No results found for this basename: LIPASE, AMYLASE,  in the last 168 hours No results found for this basename: AMMONIA,  in the last 168 hours CBC:  Recent Labs Lab 06/19/13 0400 06/20/13 1130 06/21/13 0515 06/22/13 0400 06/23/13 0345  WBC 11.6* 16.5* 15.9* 16.7*  18.4*  HGB 10.5* 11.7* 10.5* 9.5* 9.5*  HCT 32.6* 35.1* 31.9* 28.6* 29.0*  MCV 89.1 89.1 89.1 88.3 90.3  PLT 69* 27* 17* 15* 10*   Cardiac Enzymes: No results found for this basename: CKTOTAL, CKMB, CKMBINDEX, TROPONINI,  in the last 168 hours BNP: No components found with this basename: POCBNP,  CBG: No results found for this basename: GLUCAP,  in the last 168 hours  Recent Results (from the past 240 hour(s))  WOUND CULTURE     Status: None   Collection Time    06/18/13 11:34 AM      Result Value Range Status   Specimen Description HEAD   Final   Special Requests Immunocompromised   Final   Gram Stain     Final   Value: RARE WBC PRESENT,BOTH PMN AND MONONUCLEAR     NO SQUAMOUS EPITHELIAL CELLS SEEN     RARE GRAM POSITIVE COCCI     IN PAIRS     Performed at [facility]   Culture     Final   Value: FEW METHICILLIN RESISTANT STAPHYLOCOCCUS AUREUS  Note: RIFAMPIN AND GENTAMICIN SHOULD NOT BE USED AS SINGLE DRUGS FOR TREATMENT OF STAPH INFECTIONS. CRITICAL RESULT CALLED TO, READ BACK BY AND VERIFIED WITH: RUBY JOHNSON BY INGRAM A 06/21/13 11AM     Performed at [facility]   Report Status 06/21/2013 FINAL   Final   Organism ID, Bacteria METHICILLIN RESISTANT STAPHYLOCOCCUS AUREUS   Final     Studies: Dg Chest Port 1 View 06/21/2013    IMPRESSION: Complete consolidation of the right hemithorax with mediastinal shift to the right. Obstructing lesion may be present.  Diffuse left lung airspace disease. Etiology indeterminate. This may represent infectious infiltrate. Pulmonary vascular congestion/ shunting of blood may partially contribute to this appearance. Interstitial spread of tumor not excluded.  Left-sided pleural effusion may be present.  Osseous metastatic disease.   Electronically Signed   By: Bridgett Larsson   On: 06/21/2013 10:47    Scheduled Meds: . acyclovir ointment   Topical Q4H  . albuterol  2.5 mg Nebulization Q4H  . dexamethasone  4 mg Intravenous  Daily  . diltiazem  240 mg Oral Daily  . doxycycline (VIBRAMYCIN) IV  100 mg Intravenous Q12H  . feeding supplement  1 Container Oral TID BM  . furosemide  20 mg Intravenous Daily  . ipratropium  0.5 mg Nebulization Q4H  . levofloxacin (LEVAQUIN)   750 mg Intravenous Q48H  . morphine  30 mg Oral Q8H  . sodium chloride  3 mL Intravenous Q12H   Continuous Infusions:

## 2013-06-24 LAB — CBC
HCT: 26.9 % — ABNORMAL LOW (ref 36.0–46.0)
Hemoglobin: 8.7 g/dL — ABNORMAL LOW (ref 12.0–15.0)
MCH: 29 pg (ref 26.0–34.0)
MCHC: 32.3 g/dL (ref 30.0–36.0)
MCV: 89.7 fL (ref 78.0–100.0)
RBC: 3 MIL/uL — ABNORMAL LOW (ref 3.87–5.11)

## 2013-06-24 LAB — BASIC METABOLIC PANEL
BUN: 54 mg/dL — ABNORMAL HIGH (ref 6–23)
CO2: 25 mEq/L (ref 19–32)
Calcium: 7.7 mg/dL — ABNORMAL LOW (ref 8.4–10.5)
GFR calc Af Amer: 44 mL/min — ABNORMAL LOW (ref >90)
Glucose, Bld: 98 mg/dL (ref 70–99)
Sodium: 153 mEq/L — ABNORMAL HIGH (ref 135–145)

## 2013-06-24 MED ORDER — SULFAMETHOXAZOLE-TRIMETHOPRIM 400-80 MG/5ML IV SOLN
300.0000 mg | Freq: Two times a day (BID) | INTRAVENOUS | Status: DC
  Administered 2013-06-24 – 2013-06-28 (×9): 300 mg via INTRAVENOUS
  Filled 2013-06-24 (×12): qty 18.8

## 2013-06-24 MED ORDER — SULFAMETHOXAZOLE-TMP DS 800-160 MG PO TABS
2.0000 | ORAL_TABLET | Freq: Two times a day (BID) | ORAL | Status: DC
  Filled 2013-06-24 (×2): qty 2

## 2013-06-24 MED ORDER — DEXTROSE 5 % IV SOLN
INTRAVENOUS | Status: DC
  Administered 2013-06-24 – 2013-06-25 (×2): via INTRAVENOUS

## 2013-06-24 MED ORDER — VITAMINS A & D EX OINT
TOPICAL_OINTMENT | CUTANEOUS | Status: AC
  Filled 2013-06-24: qty 5

## 2013-06-24 NOTE — Progress Notes (Signed)
CSW attempted to meet with pt sons.  Pt sons were visiting with other family members at this time.  CSW to follow up to provide support.  CSW to continue to follow.  Jacklynn Lewis, MSW, LCSWA  Clinical Social Work [phone]

## 2013-06-24 NOTE — Progress Notes (Signed)
CARE MANAGEMENT NOTE 06/24/2013  Patient:  Makayla [name], Makayla [name]   Account Number:  [id number]  Date Initiated:  06/13/2013  Documentation initiated by:  Jarvis Knodel  Subjective/Objective Assessment:   pt with hx of cardiac and resp problems     Action/Plan:   home   Anticipated DC Date:  06/27/2013   Anticipated DC Plan:  SKILLED NURSING FACILITY      DC Planning Services  CM consult      Choice offered to / List presented to:  C-1 Patient   DME arranged  NA      DME agency  NA     HH arranged  HH-2 PT  HH-4 NURSE'S AIDE      HH agency  [facility]   Status of service:  Completed, signed off Medicare Important Message given?   (If response is "NO", the following Medicare IM given date fields will be blank) Date Medicare IM given:   Date Additional Medicare IM given:    Discharge Disposition:    Per UR Regulation:  Reviewed for med. necessity/level of care/duration of stay  If discussed at Long Length of Stay Meetings, dates discussed:   06/20/2013    Comments:  [id number] Earlene Plater RN, BSN, [place] [phone] Chart Reviewed for discharge and hospital needs. Discharge needs at time of review:  None Patient remains on bipap to keep o2 levels above 90%. Iv cardizem for a.fib with rvr, and iv nTG Review of patient progress due on [id number].   06/20/13 KATHY MAHABIR RN,BSN NCM [phone] TRANSFER FROM 3E.ARDS,PNA,AFIB W/RVR,URINARY RETNTION,LUNG CA W/BRAIN METS.DNR.IV ABX X2,IV LASIX,NEBS,IV DILAUDID Q2 PRN.PT-SNF.SW PROVIDED BED OFFERS.PALLIATIVE CONS IN AM.  Sharmon Leyden, RN Registered Nurse Signed CASE MANAGEMENT Care Management Note Service date: 06/17/2013 10:29 AM Cm spoke with patient at the bedside with adult sons present. Pt recommendation for HHPT. Per pt choice [facility] to provide [facility] services. [facility] rep Christina notified of referral. Pt's sons to assist in home care/24 hour supervision. Pt currently on 1L n/c during consult. Pt informed if unable to wean  off oxygen, RN to assess eligibility for home o2.    [id number] Earlene Plater, RN, BSN, [place] [phone] Chart Reviewed for discharge and hospital needs. Discharge needs at time of review:  None Review of patient progress due on [id number].

## 2013-06-24 NOTE — Progress Notes (Signed)
[facility] PROGRESS NOTE  ELON LOMELI ZOX:[id number] DOB: [birth year] DOA: 06/12/2013 PCP: [name], MD  Brief narrative: 62 year old female with past medical history of metastatic lung cancer who was admitted to [facility] ED on 06/12/2013 with acute respiratory failure secondary to flash pulmonary edema from atrial fibrillation with rapid ventricular response in the setting of acute pneumonia. She has been stable off BiPAP with initial plans to discharge 06/17/2013 however she has developed acute urinary retention of unclear etiology. She has failed attempts at voiding trials and urology recommending keeping foley in with change every month. Her hospital course has been complicated with acute respiratory distress and desaturation. She was transferred to SDU 06/19/2013 and has required BiPAP to keep O2 saturation above 90%. CXR showed complete consolidation of right hemithorax with mediastinal shift to the right. In past 24 hours mostly used ventimask to keep O2 saturation above 90%.   Assessment/Plan:   Principal Problem:  Acute respiratory failure with hypoxia / Sepsis  - Secondary to progression of lung disease, HCAP; initially on vanco and zosyn and now on doxycycline and Levaquin from 06/16/2013  - Iinitial CT scan consistent with infection/possible aspiration. No evidence of pulmonary embolism. No significant improvement based on CXR. Respiratory status is stable. - pt now on ventimask to keep O2 saturation above 90%  - will continue lasix 20 mg IV daily; monitor renal function; creatinine stable with slight trend downward.  - Pulmonology/critical care initially following patient. Signed off 06/14/2013. Will re-consult as needed  HCAP (healthcare-associated pneumonia)  - Treated with vancomycin and Zosyn---now on IV doxycycline/Levaquin. Change doxycyline to bactrim due to MRSA scalp infection - aspiration precaution  - SLP eval ordered   Active Problems:  Atrial fibrillation with rapid  ventricular response  - Pt does not tolerate PO intake and coughs every time with small sips of water  - high risk of aspiration  - use metoprolol 10 mg IV PRN every 6 hours for SBP above 130 and/or HR above100  Urinary retention with ARF  - has foley cath in place  - Creatinine 1.25 -> 1.66 --> 1.72 --> 1.70 --> 1.46 Hypokalemia  - Repleted.  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 Resolved with IVF. Now slightly hypernatremic, 147 -->149 --> 147 --> 153 - Add D5 % water Lung cancer with brain metastasis  - Current therapy: Xgeva 120 mg subcutaneously given on a monthly basis for bone metastasis status post 16 treatments. Initially, recommended ongoing aggressive care with the ultimate referral to [facility] or [facility] for consideration of participation in clinical trials. Will follow up on updated oncology recommendations.  - Received whole brain radiation under the care of Dr. Michell Heinrich.  - Continue Decadron 4 mg IV daily  Anemia of chronic disease  - Secondary to history of malignancy and sequela of chemotherapy  - Hemoglobin trending down 9.5 --> 8.7 Leukocytosis  - likely due to HCAP, MRSA scalp infection as well as decadron  - continue to monitor  Thrombocytopenia  - Secondary to history of malignancy and sequela of chemotherapy  - Platelet count 69 --> 27 -->17 --> 10 -->11; no active bleed - continue to monitor CBC  Scalp furuncle  - positive for MRS; antibiotics changed to bactrim; doxycyline discontinued    Code Status: DNR/DNI  Family Communication: the family updated at the bedside.  Disposition Plan: pending due to acute illness   Medical Consultants:  Dr. Max Fickle, Critical care  Dr. [name], Oncology Other Consultants:  None. Anti-infectives:  Vancomycin 06/12/2013--->06/16/13  Zosyn 06/12/2013--->06/16/13  Doxycycline 06/16/13---> 06/24/2013 Bactrim 06/24/2013 --> Levaquin 06/16/13--->  Manson Passey, MD  [facility]  Pager [phone]    If 7PM-7AM, please contact night-coverage [email or web address] Password [facility] 06/24/2013, 7:11 AM   LOS: 12 days    HPI/Subjective: No acute overnight event.  Objective: Filed Vitals:   06/23/13 2309 06/24/13 0000 06/24/13 0300 06/24/13 0400  BP:  124/91  111/73  Pulse:  121  118  Temp:  97.6 F (36.4 C)  98.1 F (36.7 C)  TempSrc:  Axillary  Axillary  Resp:  20  17  Height:      Weight:      SpO2: 100% 100% 99% 98%    Intake/Output Summary (Last 24 hours) at 06/24/13 0711 Last data filed at 06/24/13 0600  Gross per 24 hour  Intake    750 ml  Output   1500 ml  Net   -750 ml    Exam:   General:  Pt is in no acute distress  Cardiovascular: tachycardic, irregular, S1/S2 apprecaited  Respiratory: almost no breath sounds heard on right, no wheezing on left  Abdomen: Soft, non tender, non distended, bowel sounds present, no guarding  Extremities: no LE edema, pulses DP and PT palpable bilaterally; cyanosis on toes worse  Neuro: Grossly nonfocal  Data Reviewed: Basic Metabolic Panel:  Recent Labs Lab 06/20/13 1130 06/21/13 0515 06/22/13 0400 06/23/13 0345 06/24/13 0340  NA 147* 149* 147* 147* 153*  K 4.4 3.8 3.3* 3.9 3.8  CL 111 112 112 113* 117*  CO2 17* 22 21 25 25   GLUCOSE 117* 98 113* 120* 98  BUN 41* 49* 56* 59* 54*  CREATININE 1.48* 1.66* 1.72* 1.70* 1.46*  CALCIUM 7.9* 8.0* 7.8* 7.9* 7.7*   Liver Function Tests: No results found for this basename: AST, ALT, ALKPHOS, BILITOT, PROT, ALBUMIN,  in the last 168 hours No results found for this basename: LIPASE, AMYLASE,  in the last 168 hours No results found for this basename: AMMONIA,  in the last 168 hours CBC:  Recent Labs Lab 06/20/13 1130 06/21/13 0515 06/22/13 0400 06/23/13 0345 06/24/13 0340  WBC 16.5* 15.9* 16.7* 18.4* 13.5*  HGB 11.7* 10.5* 9.5* 9.5* 8.7*  HCT 35.1* 31.9* 28.6* 29.0* 26.9*  MCV 89.1 89.1 88.3 90.3 89.7  PLT 27* 17* 15*  10* 11*   Cardiac Enzymes: No results found for this basename: CKTOTAL, CKMB, CKMBINDEX, TROPONINI,  in the last 168 hours BNP: No components found with this basename: POCBNP,  CBG: No results found for this basename: GLUCAP,  in the last 168 hours  Recent Results (from the past 240 hour(s))  WOUND CULTURE     Status: None   Collection Time    06/18/13 11:34 AM      Result Value Range Status   Specimen Description HEAD   Final   Special Requests Immunocompromised   Final   Gram Stain     Final   Value: RARE WBC PRESENT,BOTH PMN AND MONONUCLEAR     NO SQUAMOUS EPITHELIAL CELLS SEEN     RARE GRAM POSITIVE COCCI     IN PAIRS     Performed at [facility]   Culture     Final   Value: FEW METHICILLIN RESISTANT STAPHYLOCOCCUS AUREUS     Note: RIFAMPIN AND GENTAMICIN SHOULD NOT BE USED AS SINGLE DRUGS FOR TREATMENT OF STAPH INFECTIONS. CRITICAL RESULT CALLED TO, READ BACK BY  AND VERIFIED WITH: RUBY JOHNSON BY INGRAM A 06/21/13 11AM     Performed at [facility]   Report Status 06/21/2013 FINAL   Final   Organism ID, Bacteria METHICILLIN RESISTANT STAPHYLOCOCCUS AUREUS   Final     Studies: Dg Chest Port 1 View 06/23/2013  .  IMPRESSION: No interval change.   Electronically Signed   By: Marlan Palau M.D.   On: 06/23/2013 11:59    Scheduled Meds: . acyclovir ointment   Topical Q4H  . albuterol  2.5 mg Nebulization Q4H  . dexamethasone  4 mg Intravenous Daily  . diltiazem  240 mg Oral Daily  . doxycycline (VIBRAMYCIN) IV  100 mg Intravenous Q12H  . feeding supplement  1 Container Oral TID BM  . furosemide  20 mg Intravenous Daily  . ipratropium  0.5 mg Nebulization Q4H  . levofloxacin (LEVAQUIN) IV  750 mg Intravenous Q48H  . sodium chloride  3 mL Intravenous Q12H   Continuous Infusions: . dextrose

## 2013-06-24 NOTE — Progress Notes (Signed)
Physical Therapy Discharge Patient Details Name: Makayla [name] MRN: [id number] DOB: 02-19-1951 Today's Date: 06/24/2013 Time:  -     Patient discharged from PT services secondary to medical decline - will need to re-order PT to resume therapy services.  Please see latest therapy progress note for current level of functioning and progress toward goals.    Progress and discharge plan discussed with patient and/or caregiver: Patient unable to participate in discharge planning.  GP     Sharen Heck PT [phone]  06/24/2013, 7:55 AM

## 2013-06-25 DIAGNOSIS — E43 Unspecified severe protein-calorie malnutrition: Secondary | ICD-10-CM | POA: Diagnosis present

## 2013-06-25 LAB — BASIC METABOLIC PANEL
BUN: 44 mg/dL — ABNORMAL HIGH (ref 6–23)
Chloride: 111 mEq/L (ref 96–112)
GFR calc Af Amer: 45 mL/min — ABNORMAL LOW (ref >90)
Potassium: 3.5 mEq/L (ref 3.5–5.1)
Sodium: 150 mEq/L — ABNORMAL HIGH (ref 135–145)

## 2013-06-25 LAB — CBC
HCT: 28.6 % — ABNORMAL LOW (ref 36.0–46.0)
Hemoglobin: 9.2 g/dL — ABNORMAL LOW (ref 12.0–15.0)
RDW: 18.3 % — ABNORMAL HIGH (ref 11.5–15.5)
WBC: 13 10*3/uL — ABNORMAL HIGH (ref 4.0–10.5)

## 2013-06-25 MED ORDER — BISACODYL 10 MG RE SUPP: 10.0000 mg | Freq: Every day | RECTAL | Status: DC | PRN

## 2013-06-25 NOTE — Progress Notes (Signed)
NUTRITION FOLLOW UP  DOCUMENTATION CODES  Per approved criteria   -Severe malnutrition in the context of acute illness   Intervention:   STRONGLY recommend initiation of nutrition support if within goals of care.  If enteral nutrition desired, please consult RD. Monitor magnesium, potassium, and phosphorus daily for at least 3 days, MD to replete as needed, as pt is at risk for refeeding syndrome given ongoing very poor oral intake. Discontinue Resource Breeze po TID, each supplement provides 250 kcal and 9 grams of protein and Magic Cup once daily. RD to continue to follow nutrition care plan.  Nutrition Dx:   Inadequate oral intake related to poor appetite and sore throat as evidenced by 10% wt loss in less than 6 months; ongoing.  Goal:   Pt to meet >/= 90% of their estimated nutrition needs; not met  Monitor:   Po intake, weight, labs, ?GOC, swallowing ability  Assessment:   62 yo female with stage 4 lung cancer comes in with sob, worsening resp distress, cough. She has had extension of her cancer despite aggressive chemo/radiation and is actually undergoing evaluation to possibly start experimental tx in [place].   Pt developed respiratory and mental alertness decline on 9/17 and was transferred to step-down unit. Remains in SDU at this time. SLP evaluated patient this morning and recommends NPO 2/2 severe aspiration risk, pt with significantly worsened dysphagia.  Pt now meets criteria for severe malnutrition in the context of acute illness, pt with oral intake of <50% x at least 5 days and wt loss of at least 5 lb since admission (4% x 2 weeks.) Pt has developed a stage II wound on buttocks since admit.  Height: Ht Readings from Last 1 Encounters:  06/19/13 5\' 3"  (1.6 m)    Weight Status:   Wt Readings from Last 1 Encounters:  06/25/13 124 lb 9 oz (56.5 kg)  Admit wt 129 lb  Re-estimated needs:  Kcal: 5638-7564 Protein: 75-95 g Fluid: 1.8-1.9 L  Skin: excoriated  and red perineum  Diet Order: NPO   Intake/Output Summary (Last 24 hours) at 06/25/13 1131 Last data filed at 06/25/13 0600  Gross per 24 hour  Intake   1228 ml  Output   1405 ml  Net   -177 ml    Last BM: 9/17   Labs:   Recent Labs Lab 06/23/13 0345 06/24/13 0340 06/25/13 0545  NA 147* 153* 150*  K 3.9 3.8 3.5  CL 113* 117* 111  CO2 25 25 28   BUN 59* 54* 44*  CREATININE 1.70* 1.46* 1.42*  CALCIUM 7.9* 7.7* 7.7*  GLUCOSE 120* 98 114*    CBG (last 3)  No results found for this basename: GLUCAP,  in the last 72 hours  Scheduled Meds: . acyclovir ointment   Topical Q4H  . albuterol  2.5 mg Nebulization Q4H  . antiseptic oral rinse  15 mL Mouth Rinse q12n4p  . chlorhexidine  15 mL Mouth Rinse BID  . dexamethasone  4 mg Intravenous Daily  . diltiazem  240 mg Oral Daily  . feeding supplement  1 Container Oral TID BM  . furosemide  20 mg Intravenous Daily  . ipratropium  0.5 mg Nebulization Q4H  . levofloxacin (LEVAQUIN) IV  750 mg Intravenous Q48H  . sodium chloride  3 mL Intravenous Q12H  . sulfamethoxazole-trimethoprim  300 mg Intravenous Q12H    Continuous Infusions: . dextrose 50 mL/hr at 06/25/13 1055    Jarold Motto MS, RD, LDN Pager: [phone] After-hours  pager: [phone]

## 2013-06-25 NOTE — Progress Notes (Signed)
[facility] PROGRESS NOTE  Makayla [name] UJW:[id number] DOB: Oct 20, 1950 DOA: 06/12/2013 PCP: [name], MD  Brief narrative: 62 year old female with past medical history of metastatic lung cancer who was admitted to [facility] ED on 06/12/2013 with acute respiratory failure secondary to flash pulmonary edema from atrial fibrillation with rapid ventricular response in the setting of acute pneumonia. She has been stable off BiPAP with initial plans to discharge 06/17/2013 however she has developed acute urinary retention of unclear etiology. She has failed attempts at voiding trials and urology recommending keeping foley in with change every month. Her hospital course has been complicated with acute respiratory distress and desaturation. She was transferred to SDU 06/19/2013 and has required BiPAP to keep O2 saturation above 90%. CXR showed complete consolidation of right hemithorax with mediastinal shift to the right. Over past 48 hours pt had some but very slow improvement. She mostly requires ventimask to keep O2 saturation above 90%.   Assessment/Plan:   Principal Problem:  *Acute respiratory failure with hypoxia / Sepsis  - Secondary to progression of lung disease, HCAP; initially on vanco and zosyn and then on doxycycline and Levaquin from 06/16/2013. Patient has been on antibiotics from 06/12/2013. We will not discontinue Levaquin so she will remain on doxycycline which of note is for MRSA scalp infection. - Iinitial CT scan was consistent with infection/possible aspiration. No evidence of pulmonary embolism. No significant improvement based on repeat CXR.  - Respiratory status remains stable and pt now on ventimask to keep O2 saturation above 90%  - Pulmonology/critical care initially following patient. Signed off 06/14/2013. Will re-consult as needed   Active Problems: HCAP (healthcare-associated pneumonia), Aspiration pneumonitis - Treated with vancomycin and Zosyn which then switched to IV  doxycycline/Levaquin from 06/16/2013. As mentioned above, all antibiotics now discontinued except for Bactrim which was started for MRSA scalp infection - aspiration precaution; based on the swallow evaluation patient should remain n.p.o. for now Atrial fibrillation with rapid ventricular response  - Pt does not tolerate PO intake and coughs every time with small sips of water  - high risk of aspiration  - use metoprolol 10 mg IV PRN every 6 hours for SBP above 130 and/or HR above100  Acute renal failure - Secondary to urinary retention and possibly Lasix - has foley cath in place; has had -1 L fluid balance in past 24 hours. - Creatinine  1.72 --> 1.70 --> 1.42 - Patient is on Bactrim for scalp infections who continue to monitor renal function. Hypokalemia  - Repleted.  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 Resolved with IVF.  - Now hypernatremic, 147 -->149 --> 153 so we added D5 % water with slow trend downward to 150 this am  Lung cancer with brain metastasis  - Chemotherapy with Xgeva 120 mg subQ given on a monthly basis for bone metastasis status post 16 treatments. Initially, recommended ongoing aggressive care with the ultimate referral to [facility] or [facility] for consideration of participation in clinical trials. Follow up with oncology in regards to further treatment plan. [name] has seen the pt again on 06/24/2013 - Received whole brain radiation under the care of Dr. Michell Heinrich.  - Continue Decadron 4 mg IV daily  Anemia of chronic disease  - Secondary to history of malignancy and sequela of chemotherapy  - Hemoglobin stable at 9.2 - no indications for transfusion  Leukocytosis  - likely due to HCAP, MRSA scalp infection as well  as decadron  - continue to monitor  Thrombocytopenia  - Secondary to history of malignancy and sequela of chemotherapy  - Platelet count 69 ---> 10 -->11 -->; no active bleed  - continue to  monitor CBC  Scalp furuncle  - positive for MRSA; antibiotics changed to bactrim; doxycyline discontinued  Severe protein calorie malnutrition - due to poor intake and high aspiration risk - still NPO but mental status has improved - now on D5% water IV fluids   Code Status: DNR/DNI  Family Communication: the family updated at the bedside.  Disposition Plan: pending due to acute illness    Medical Consultants:  Dr. Max Fickle, Critical care  Dr. [name], Oncology Other Consultants:  SLP. Anti-infectives:  Bactrim 06/24/2013 -->  Levaquin 06/16/13---> 06/25/2013 Doxycycline 06/16/13---> 06/24/2013  Vancomycin 06/12/2013--->06/16/13  Zosyn 06/12/2013--->06/16/13    Manson Passey, MD  [facility]  Pager [phone]   If 7PM-7AM, please contact night-coverage [email or web address] Password [email or web address] 06/25/2013, 9:01 AM   LOS: 13 days    HPI/Subjective: No acute overnight events  Objective: Filed Vitals:   06/25/13 0500 06/25/13 0600 06/25/13 0747 06/25/13 0800  BP:  116/61    Pulse: 114 113    Temp:    97 F (36.1 C)  TempSrc:    Oral  Resp: 15 10    Height:      Weight: 56.5 kg (124 lb 9 oz)     SpO2: 100%  98%     Intake/Output Summary (Last 24 hours) at 06/25/13 0901 Last data filed at 06/25/13 0600  Gross per 24 hour  Intake   1547 ml  Output   1905 ml  Net   -358 ml    Exam:   General:  Pt is alert, not in acute distress  Cardiovascular: Regular rate and rhythm, S1/S2apprecaited  Respiratory: Significantly diminished breath sounds over right lung, no wheezing over left lung lobes  Abdomen: Soft, non tender, non distended, bowel sounds present, no guarding  Extremities: Lower extremity +1 pitting edema; cyanosis present on fingers and toes (similar to past few days)  Neuro: Grossly nonfocal  Data Reviewed: Basic Metabolic Panel:  Recent Labs Lab 06/21/13 0515 06/22/13 0400 06/23/13 0345 06/24/13 0340 06/25/13 0545  NA 149* 147* 147*  153* 150*  K 3.8 3.3* 3.9 3.8 3.5  CL 112 112 113* 117* 111  CO2 22 21 25 25 28   GLUCOSE 98 113* 120* 98 114*  BUN 49* 56* 59* 54* 44*  CREATININE 1.66* 1.72* 1.70* 1.46* 1.42*  CALCIUM 8.0* 7.8* 7.9* 7.7* 7.7*   Liver Function Tests: No results found for this basename: AST, ALT, ALKPHOS, BILITOT, PROT, ALBUMIN,  in the last 168 hours No results found for this basename: LIPASE, AMYLASE,  in the last 168 hours No results found for this basename: AMMONIA,  in the last 168 hours CBC:  Recent Labs Lab 06/21/13 0515 06/22/13 0400 06/23/13 0345 06/24/13 0340 06/25/13 0545  WBC 15.9* 16.7* 18.4* 13.5* 13.0*  HGB 10.5* 9.5* 9.5* 8.7* 9.2*  HCT 31.9* 28.6* 29.0* 26.9* 28.6*  MCV 89.1 88.3 90.3 89.7 90.2  PLT 17* 15* 10* 11* 12*   Cardiac Enzymes: No results found for this basename: CKTOTAL, CKMB, CKMBINDEX, TROPONINI,  in the last 168 hours BNP: No components found with this basename: POCBNP,  CBG: No results found for this basename: GLUCAP,  in the last 168 hours  Recent Results (from the past 240 hour(s))  WOUND CULTURE     Status: None  Collection Time    06/18/13 11:34 AM      Result Value Range Status   Specimen Description HEAD   Final   Special Requests Immunocompromised   Final   Gram Stain     Final   Value: RARE WBC PRESENT,BOTH PMN AND MONONUCLEAR     NO SQUAMOUS EPITHELIAL CELLS SEEN     RARE GRAM POSITIVE COCCI     IN PAIRS     Performed at [facility]   Culture     Final   Value: FEW METHICILLIN RESISTANT STAPHYLOCOCCUS AUREUS     Note: RIFAMPIN AND GENTAMICIN SHOULD NOT BE USED AS SINGLE DRUGS FOR TREATMENT OF STAPH INFECTIONS. CRITICAL RESULT CALLED TO, READ BACK BY AND VERIFIED WITH: RUBY JOHNSON BY INGRAM A 06/21/13 11AM     Performed at [facility]   Report Status 06/21/2013 FINAL   Final   Organism ID, Bacteria METHICILLIN RESISTANT STAPHYLOCOCCUS AUREUS   Final     Studies: Dg Chest Port 1 View  06/23/2013    IMPRESSION: No  interval change.   Electronically Signed   By: Marlan Palau M.D.   On: 06/23/2013 11:59    Scheduled Meds: . acyclovir ointment   Topical Q4H  . albuterol  2.5 mg Nebulization Q4H  . antiseptic oral rinse  15 mL Mouth Rinse q12n4p  . chlorhexidine  15 mL Mouth Rinse BID  . dexamethasone  4 mg Intravenous Daily  . diltiazem  240 mg Oral Daily  . feeding supplement  1 Container Oral TID BM  . furosemide  20 mg Intravenous Daily  . ipratropium  0.5 mg Nebulization Q4H  . levofloxacin (LEVAQUIN) IV  750 mg Intravenous Q48H  . sodium chloride  3 mL Intravenous Q12H  . sulfamethoxazole-trimethoprim  300 mg Intravenous Q12H   Continuous Infusions: . dextrose 50 mL/hr at 06/24/13 [phone]

## 2013-06-25 NOTE — Progress Notes (Signed)
CSW met with pt two adult sons in conference room.  Pt sons report that at this time they are taking things day by day.   Pt sons are able to discuss the discussions that they have had with MD and Oncologist and remain cautiously optimistic that pt will show improvements, but recognize that pt has a "long road ahead of her" and CSW provided support as they discussed their concerns about pt current inability to swallow.   Pt sons report strong support from pt family and continue to exhibit self care.  CSW to continue to follow to provide support.  Jacklynn Lewis, MSW, LCSWA  Clinical Social Work [phone]

## 2013-06-25 NOTE — Evaluation (Signed)
Clinical/Bedside Swallow Evaluation Patient Details  Name: Makayla [name] MRN: [id number] Date of Birth: Feb 21, 1951  Today's Date: 06/25/2013 Time: 0900-0940 SLP Time Calculation (min): 40 min  Past Medical History:  Past Medical History  Diagnosis Date  . COPD (chronic obstructive pulmonary disease)   . Hx of radiation therapy   . Hx antineoplastic chemotherapy   . Shortness of breath   . GERD (gastroesophageal reflux disease)   . Lung cancer     lung ca dx 12/10  . Lung cancer 09/01/2011    bone mets  . 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 HCAP (healthcare-associated pneumonia) 06/12/2013  . Urinary retention 06/18/2013   Past Surgical History:  Past Surgical History  Procedure Laterality Date  . Abdominal hysterectomy    . Surgery right leg, rod and pin     HPI:  62 yo female with h/o metastatic lung cancer undergoing whole brain radiation,  adm 9/10 with flash pulmonary edema with afib and rapid ventricular response.  Pt required Bipap usage and had improved status when she started having urinary retention - respiratory issues.  Was transferred to [facility] 9/17 secondary to respiratory issues.  CXR 9/21 Diffuse bilateral infiltrate in the left lung may represent pna or edema, complete consolidation of right hemithorax with mediastinal shift to right.   Swallow evaluation order received due to pt having problems swallowing and coughing on liquids.  Pt with ? osseous mets at C4, C5-C6, and T2.  Brain MRI showed multiple metastatic locations.  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  Family (sons) present and report pt with occasional difficulties swallowing over the last six months, coughing during meal approx once every other meal.   Dysphagia occuring for six months - consistent with hoarseness per family.  Pt reports weight loss over time.  She also admitted with direct question,  that eating is "work" and not enjoyable.  Pt reports dysphagia has worsened with this admission.     Assessment / Plan / Recommendation Clinical Impression  Pt presents wtih clinical indications of severe pharyngeal dysphagia and suspected overt aspiration of po given (only provided pt with nectar via tsp, ice, water via tsp and cup).  Multiple swallows concerning for stasis and overt mostly nonproductive coughing noted with intake - not noted before po initiated.  Head turn left and right attempted for improved airway but was not effective. Solids and puree not given due to high aspiration risk.    Suspect given level of metastatic disease and concern for osseous mets, ? could nerve impingment of at least vagal nerve and glossopharyngeal be present.  In addition, pt's cough is weak and largely nonprotective, therefore when she does aspirate, clearance ability is compromised.  Pt is high aspiration and malnutrition risk due to level of dysphagia.    Defer to MD if desire for pt to continue po with mitigation strategies  but aspiration will be ongoing.  question if pt would benefit from goals of care meeting as she reports eating not pleasurable and has ongoing dysphagia that has now worsened significantly.    Options allow po with known risks or npo except ice chips and reassess with medical improvement (however do not anticipate pt to return to functional level of swallow).     SLP to follow briefly to assist family/pt with education.      Aspiration Risk  SEVERE   Diet Recommendation NPO;Ice chips PRN after oral care (defer to md if desire pt to have po intake with risks)        Other  Recommendations   n/a  Follow Up Recommendations    TBD   Frequency and Duration min 2x/week  1 week   Pertinent Vitals/Pain No pain, afebrile, weak cough    SLP Swallow Goals Goal #3: Family, pt, MD will determine if pt to eat with accepted aspiration risks and mitigation strategies.    Swallow  Study Prior Functional Status   dysphagia PTA - coughing with every other meal x six months    General Date of Onset: 06/25/13 HPI: 62 yo female with h/o metastatic lung cancer undergoing whole brain radiation,  adm 9/10 with flash pulmonary edema with afib and rapid ventricular response.  Pt required Bipap usage and had improved status when she started having urinary retention - respiratory issues.  Was transferred to [facility] 9/17 secondary to respiratory issues.  CXR 9/21 Diffuse bilateral infiltrate in the left lung may represent pna or edema, complete consolidation of right hemithorax with mediastinal shift to right.   Swallow evaluation order received due to pt having problems swallowing and coughing on liquids.  Pt with ? osseous mets at C4, C5-C6, and T2.  Brain MRI showed multiple metastatic locations.  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  Family (sons) present and report pt with occasional difficulties swallowing over the last six months, coughing during meal approx once every other meal.   Dysphagia occuring for six months - consistent with hoarseness per family.  Pt reports weight loss over time.  She also admitted with direct question, that eating is "work" and not enjoyable.  Pt reports dysphagia has worsened with this admission.   Type of Study: Bedside swallow evaluation Previous Swallow Assessment: none Diet Prior to this Study: Regular;Thin liquids Temperature Spikes Noted: No Respiratory Status: Supplemental O2 delivered via (comment) History of Recent Intubation: No Behavior/Cognition: Alert;Cooperative;Pleasant mood Oral Cavity - Dentition: Adequate natural dentition Self-Feeding Abilities: Total assist Patient Positioning: Upright in bed Baseline Vocal Quality: Breathy;Aphonic Volitional Cough: Weak (pt cough is weak and nonproductive) Volitional Swallow: Unable to elicit    Oral/Motor/Sensory Function  Overall Oral Motor/Sensory Function: Impaired (grossly weak, concern for CN dysfunction given level of mets) Facial Symmetry: Left droop   Ice Chips Ice chips: Impaired Pharyngeal Phase Impairments: Throat Clearing - Immediate;Decreased hyoid-laryngeal movement;Suspected delayed Swallow   Thin Liquid Thin Liquid: Impaired Presentation: Straw;Cup Pharyngeal  Phase Impairments: Decreased hyoid-laryngeal movement;Multiple swallows;Cough - Immediate;Throat Clearing - Immediate    Nectar Thick Nectar Thick Liquid: Impaired Pharyngeal Phase Impairments: Multiple swallows;Cough - Delayed;Throat Clearing - Delayed;Decreased hyoid-laryngeal movement   Honey Thick Honey Thick Liquid: Not tested   Puree Puree: Not tested   Solid   GO    Solid: Not tested       Donavan Burnet, MS [facility] SLP [phone]

## 2013-06-25 NOTE — Progress Notes (Signed)
Peripherally Inserted Central Catheter/Midline Placement  The IV Nurse has discussed with the patient and/or persons authorized to consent for the patient, the purpose of this procedure and the potential benefits and risks involved with this procedure.  The benefits include less needle sticks, lab draws from the catheter and patient may be discharged home with the catheter.  Risks include, but not limited to, infection, bleeding, blood clot (thrombus formation), and puncture of an artery; nerve damage and irregular heat beat.  Alternatives to this procedure were also discussed.   Unable to advance PICC for last 7 cm despite stiffening with radwire and multiple repositions. Primary RN notified. Recommend IR to advance/reposition PICC in AM.    PICC/Midline Placement Documentation  PICC Triple Lumen 06/25/13 PICC Right Basilic (Active)       Christeen Douglas 06/25/2013, 10:46 PM

## 2013-06-25 NOTE — Progress Notes (Signed)
Stopped to see patient today. Had prayer, patient was asleep.

## 2013-06-26 ENCOUNTER — Inpatient Hospital Stay (HOSPITAL_COMMUNITY): Payer: Managed Care, Other (non HMO)

## 2013-06-26 DIAGNOSIS — R627 Adult failure to thrive: Secondary | ICD-10-CM

## 2013-06-26 DIAGNOSIS — D696 Thrombocytopenia, unspecified: Secondary | ICD-10-CM | POA: Diagnosis present

## 2013-06-26 DIAGNOSIS — R4702 Dysphasia: Secondary | ICD-10-CM | POA: Diagnosis not present

## 2013-06-26 DIAGNOSIS — C3490 Malignant neoplasm of unspecified part of unspecified bronchus or lung: Secondary | ICD-10-CM

## 2013-06-26 DIAGNOSIS — J9600 Acute respiratory failure, unspecified whether with hypoxia or hypercapnia: Secondary | ICD-10-CM

## 2013-06-26 DIAGNOSIS — D638 Anemia in other chronic diseases classified elsewhere: Secondary | ICD-10-CM | POA: Diagnosis present

## 2013-06-26 DIAGNOSIS — C7931 Secondary malignant neoplasm of brain: Secondary | ICD-10-CM

## 2013-06-26 DIAGNOSIS — Z515 Encounter for palliative care: Secondary | ICD-10-CM

## 2013-06-26 DIAGNOSIS — C7949 Secondary malignant neoplasm of other parts of nervous system: Secondary | ICD-10-CM

## 2013-06-26 LAB — CBC
HCT: 28.6 % — ABNORMAL LOW (ref 36.0–46.0)
Hemoglobin: 9.3 g/dL — ABNORMAL LOW (ref 12.0–15.0)
MCHC: 32.5 g/dL (ref 30.0–36.0)
RBC: 3.2 MIL/uL — ABNORMAL LOW (ref 3.87–5.11)

## 2013-06-26 LAB — BASIC METABOLIC PANEL
BUN: 40 mg/dL — ABNORMAL HIGH (ref 6–23)
CO2: 28 mEq/L (ref 19–32)
Chloride: 105 mEq/L (ref 96–112)
GFR calc non Af Amer: 43 mL/min — ABNORMAL LOW (ref >90)
Glucose, Bld: 94 mg/dL (ref 70–99)
Potassium: 3.3 mEq/L — ABNORMAL LOW (ref 3.5–5.1)

## 2013-06-26 MED ORDER — POTASSIUM CHLORIDE 10 MEQ/100ML IV SOLN
10.0000 meq | INTRAVENOUS | Status: AC
  Administered 2013-06-26 (×2): 10 meq via INTRAVENOUS
  Filled 2013-06-26 (×2): qty 100

## 2013-06-26 MED ORDER — MORPHINE SULFATE 2 MG/ML IJ SOLN
2.0000 mg | Freq: Once | INTRAMUSCULAR | Status: AC
  Administered 2013-06-26: 2 mg via INTRAVENOUS
  Filled 2013-06-26: qty 1

## 2013-06-26 MED ORDER — LORAZEPAM 2 MG/ML IJ SOLN
1.0000 mg | INTRAMUSCULAR | Status: DC | PRN
  Administered 2013-06-26 – 2013-06-28 (×4): 1 mg via INTRAVENOUS
  Filled 2013-06-26 (×6): qty 1

## 2013-06-26 MED ORDER — MORPHINE SULFATE 2 MG/ML IJ SOLN
2.0000 mg | INTRAMUSCULAR | Status: DC | PRN
  Administered 2013-06-26 – 2013-06-27 (×2): 2 mg via INTRAVENOUS
  Filled 2013-06-26 (×3): qty 1

## 2013-06-26 MED ORDER — POTASSIUM CHLORIDE 10 MEQ/100ML IV SOLN
10.0000 meq | INTRAVENOUS | Status: AC
  Administered 2013-06-26 (×2): 10 meq via INTRAVENOUS
  Filled 2013-06-26: qty 100

## 2013-06-26 MED ORDER — LORAZEPAM 2 MG/ML IJ SOLN: 0.5000 mg | INTRAMUSCULAR | Status: DC | PRN

## 2013-06-26 NOTE — Progress Notes (Signed)
CSW received notification from PMT MD, Dr. Phillips Odor that pt and pt family are interested in residential hospice; specifically [facility].   CSW met with pt two sons and sister at bedside to discuss. Pt currently receiving PICC line. CSW provided residential hospice list and confirmed that pt family interested [facility].  CSW encouraged pt family to review list and have secondary options for Hospice Home in mind in the instance that [facility] does not have any availability. Pt family expressed understanding and will review list.  CSW contacted [facility] who stated that they do not have bed availability at this time, but agreeable to receiving referral.  CSW faxed referral to [facility] and notified pt family of no bed availability at [facility] for today.  CSW to continue to follow and provide support and assist with residential hospice placement.   Jacklynn Lewis, MSW, LCSWA  Clinical Social Work [phone]

## 2013-06-26 NOTE — Progress Notes (Signed)
SLP Cancellation Note  Patient Details Name: NANCIE BOCANEGRA MRN: [id number] DOB: [birth year]   Cancelled treatment:        Note pt now comfort care and to dc with hospice care.  SLP to sign off, please reorder if desire.    Donavan Burnet, MS [facility] SLP [phone]    Chales Abrahams 06/26/2013, 3:14 PM

## 2013-06-26 NOTE — Consult Note (Signed)
Palliative Medicine Team at [facility]  Date: 06/26/2013   Patient Name: Makayla [name]  DOB: 05-08-1951  MRN: [id number]  Age / Sex: 62 y.o., female   PCP: [name], MD Referring Physician: Maryruth Bun Rama, MD  HPI/Reason for Consultation: Ms. Makayla [name] is a 62 yo woman who has had a 4 year battle with stage IV lung cancer, she has far outlived her original diagnosis by "years". She has had multiple courses of chemotherapy and radiation since December of 2010. Most recently she developed brain mets and underwent whole brain readitaton and was prior to this admission actively participating in a clinical trail at [facility]. She was admitted with acute respiratory failure on 06/12/2013 and her hospital course has been complicated by severe deconditioning, recurrent/resistant infection, and now severe dysphagia. She has exhausted her options for chemotherapy. She has a loving and supportive family and they have opted for comfort care. PMT has been consulted to help with symptom management and care coordination.  Participants in Discussion: Patient's two sons, patient, her two sisters  Goals/Summary of Discussion:  1. Code Status:  DNR  2. Scope of Treatment:  Full Comfort Care  Comfort Feeding, She wants a "popsicle".  No aggressive medical interventions, discontinue labs   3. Assessment/Plan:  [name] is actively dying of advanced end stage lung cancer after enduring 4 years of chemotherapy and radiation, her body is shutting down, multi-organ failure and she is gracefully requesting comfort care as the next step. She is extremely weak and her prognosis is probabably days. I discussed her current condition, prognosis and the option of residential inpatient hospice for pain control and comfort care at EOL.  Primary Diagnoses  1. 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 Acute Respiratory Failure,  pulmonary edema 4. Scalp Lesion/Infected, draining 5. Herpes Labialis, on topical valtrex 6. Renal Failure 7. SIADH 8. Anemia 9. Thrombocytopenia  Prognosis: Days   PPS: 20   Active Symptoms 1. Pain related to her cancer, 10/10 not currently on pain meds in ICU due to hypotension, she has echymosis all over her extremities from blood draw sticks, she has a PICC line now but currently it is not functioning well. I have started her on Morphine IV q1 prn, I suspect she will need a continuous infusion, will use Roxanol until access is established.-IR is going to adjust her PICCl ine today. 2. Cancer related fatigue 3. Terminal Dysphagia 4. Insomnia, anxiety 5. Herpes Labialis/tolpical Valtrex 6. Scalp Wound, Draining purulent material, needs dressing changes  4. Palliative Prophylaxis:   Bowel Regimen   Terminal Secretions  Breakthrough Pain and Dyspnea   Agitation and Delirium  Nausea  5. Psychosocial Spiritual Asssessment/Interventions:  Patient and Family Adjustment to Illness/Prognosis: Appropriately Grieving  Spiritual Concerns or Needs: Chaplain support  6. Disposition: Family requesting [facility]  Social History:   reports that she quit smoking about 12 years ago. Her smoking use included Cigarettes. She smoked 0.50 packs per day. She does not have any smokeless tobacco history on file. She reports that she does not drink alcohol or use illicit drugs.   Family History: Family History  Problem Relation Age of Onset  . Cancer Neg Hx     Active Medications:  Outpatient medications: Prescriptions prior to admission  Medication Sig Dispense Refill  . ALPRAZolam (XANAX) 0.5 MG tablet Take 1 mg by mouth at bedtime as needed for sleep.       Marland Kitchen  calcium-vitamin D (OSCAL WITH D) 500-200 MG-UNIT per tablet Take 1 tablet by mouth daily.        . clindamycin (CLEOCIN T) 1 % external solution Apply 1 application topically 2 (two) times daily as  needed (for scalp lesions).       Marland Kitchen dexamethasone (DECADRON) 4 MG tablet Take 4 mg by mouth daily.       . Docosanol (ABREVA) 10 % CREA Apply 1 application topically as needed. Blister/scab on bottom lip      . ferrous sulfate 325 (65 FE) MG tablet Take 325 mg by mouth 2 (two) times daily.        . Hydrocodone-Acetaminophen 5-300 MG TABS TAKE 1 TABLET BY MOUTH EVERY 6 HOURS AS NEEDED FOR PAIN  60 each  0  . Magnesium 250 MG TABS Take 1 tablet by mouth daily.      Marland Kitchen morphine (MS CONTIN) 30 MG 12 hr tablet Take 1 tablet (30 mg total) by mouth every 8 (eight) hours.  90 tablet  0  . neomycin-bacitracin-polymyxin (NEOSPORIN) [phone] ointment Apply topically 4 (four) times daily. Pt states she puts it in nares sometimes      . Alum & Mag Hydroxide-Simeth (MAGIC MOUTHWASH W/LIDOCAINE) SOLN Take 15 mLs by mouth 3 (three) times daily as needed.  480 mL  1  . sucralfate (CARAFATE) 1 GM/10ML suspension Take 10 mLs (1 g total) by mouth 4 (four) times daily.  420 mL  0    Current medications: Infusions: . dextrose 50 mL/hr at 06/25/13 1055    Scheduled Medications: . acyclovir ointment   Topical Q4H  . albuterol  2.5 mg Nebulization Q4H  . antiseptic oral rinse  15 mL Mouth Rinse q12n4p  . chlorhexidine  15 mL Mouth Rinse BID  . dexamethasone  4 mg Intravenous Daily  . furosemide  20 mg Intravenous Daily  . ipratropium  0.5 mg Nebulization Q4H  .  morphine injection  2 mg Intravenous Once  . potassium chloride  10 mEq Intravenous Q1 Hr x 4  . sodium chloride  3 mL Intravenous Q12H  . sulfamethoxazole-trimethoprim  300 mg Intravenous Q12H    PRN Medications: sodium chloride, albuterol, bisacodyl, HYDROmorphone (DILAUDID) injection, ipratropium, LORazepam, LORazepam, metoprolol, morphine injection, ondansetron (ZOFRAN) IV, sodium chloride   Vital Signs: BP 114/67  Pulse 116  Temp(Src) 97.8 F (36.6 C) (Oral)  Resp 25  Ht 5\' 3"  (1.6 m)  Wt 56 kg (123 lb 7.3 oz)  BMI 21.88 kg/m2  SpO2  99%   Physical Exam:  Very frail, horseness, whipsers Skin with multiple echymosis Shallow inspirations, rhonchi Scalp lesion with drg, draining Cold sore on her lower lip Mucosa is very dry Tachycardiac, slightly labored breathing.  Labs:  Basic or Comprehensive Metabolic Panel:    Component Value Date/Time   NA 144 06/26/2013 0330   NA 131* 06/05/2013 1509   NA 137 09/21/2011 0902   K 3.3* 06/26/2013 0330   K 4.9 06/05/2013 1509   K 4.2 09/21/2011 0902   CL 105 06/26/2013 0330   CL 102 03/13/2013 1023   CL 101 09/21/2011 0902   CO2 28 06/26/2013 0330   CO2 24 06/05/2013 1509   CO2 28 09/21/2011 0902   BUN 40* 06/26/2013 0330   BUN 22.6 06/05/2013 1509   BUN 16 09/21/2011 0902   CREATININE 1.31* 06/26/2013 0330   CREATININE 0.7 06/05/2013 1509   CREATININE 1.0 09/21/2011 0902   GLUCOSE 94 06/26/2013 0330   GLUCOSE 117 06/05/2013  1509   GLUCOSE 87 03/13/2013 1023   GLUCOSE 86 09/21/2011 0902   CALCIUM 7.6* 06/26/2013 0330   CALCIUM 9.0 06/05/2013 1509   CALCIUM 8.7 09/21/2011 0902   AST 12 06/12/2013 1525   AST 14 06/05/2013 1509   AST 27 09/21/2011 0902   ALT 20 06/12/2013 1525   ALT 38 06/05/2013 1509   ALT 26 09/21/2011 0902   ALKPHOS 100 06/12/2013 1525   ALKPHOS 114 06/05/2013 1509   ALKPHOS 130* 09/21/2011 0902   BILITOT 0.6 06/12/2013 1525   BILITOT 0.31 06/05/2013 1509   BILITOT 0.60 09/21/2011 0902   PROT 6.0 06/12/2013 1525   PROT 6.6 06/05/2013 1509   PROT 7.6 09/21/2011 0902   ALBUMIN 2.9* 06/12/2013 1525   ALBUMIN 3.0* 06/05/2013 1509     CBC:    Component Value Date/Time   WBC 12.8* 06/26/2013 0330   WBC 20.1* 06/05/2013 1509   HGB 9.3* 06/26/2013 0330   HGB 12.2 06/05/2013 1509   HCT 28.6* 06/26/2013 0330   HCT 36.8 06/05/2013 1509   PLT 16* 06/26/2013 0330   PLT 272 06/05/2013 1509   MCV 89.4 06/26/2013 0330   MCV 85.7 06/05/2013 1509   NEUTROABS 12.8* 06/12/2013 1525   NEUTROABS 18.8* 06/05/2013 1509   LYMPHSABS 0.6* 06/12/2013 1525   LYMPHSABS 0.4* 06/05/2013 1509   MONOABS 0.5  06/12/2013 1525   MONOABS 0.9 06/05/2013 1509   EOSABS 0.1 06/12/2013 1525   EOSABS 0.0 06/05/2013 1509   BASOSABS 0.0 06/12/2013 1525   BASOSABS 0.0 06/05/2013 1509   Other Data:  (2D echo, EKG...) All reviewed   Time: 70 minutes Greater than 50%  of this time was spent counseling and coordinating care related to the above assessment and plan.  Signed by: Edsel Petrin, DO  06/26/2013, 8:55 AM  Please contact Palliative Medicine Team phone at [phone] for questions and concerns.

## 2013-06-26 NOTE — Progress Notes (Signed)
[facility] PROGRESS NOTE  MAYLIE ASHTON NWG:[id number] DOB: 13-Jun-1951 DOA: 06/12/2013 PCP: [name], MD  Brief narrative: Makayla [name] is an 62 y.o. female with a PMH of metastatic lung cancer who was admitted on 06/12/2013 with acute respiratory failure secondary to flash pulmonary edema from atrial fibrillation with rapid ventricular response in the setting of acute pneumonia. She has been stable off BiPAP, with plans to discharge 06/17/2013 however she has developed acute urinary retention of unclear etiology. She has failed attempts at voiding trials and urology recommended keeping foley in with change every month. Her hospital course has been complicated with acute respiratory distress and desaturation. She was transferred to SDU 06/19/2013 and has required BiPAP to keep O2 saturation above 90%. CXR showed complete consolidation of right hemithorax with mediastinal shift to the right. Over past 48 hours pt had some but very slow improvement. She mostly requires ventimask to keep O2 saturation above 90%.    Assessment/Plan: Principal Problem:   Acute respiratory failure with hypoxia / SOB (shortness of breath) / sepsis -Secondary to HCAP and flash pulmonary edema in the setting of atrial fibrillation with rapid ventricular response. Initially on vancomycin/Zosyn.  Switched to oral doxycycline/Levaquin 06/16/13. -Initially treated with BiPAP, now on ventimask to keep O2 saturation > 100%. -Diuresed to address flash pulmonary edema.  -Pulmonology/critical care initially following patient. Signed off 06/14/2013. -CT scan consistent with infection/possible aspiration. No evidence of pulmonary embolism.  No significant improvement based on repeat CXR.  Active Problems:   Thrombocytopenia -No history of prior chemo to explain.  May be from sepsis, bone marrow failure.   Dysphasia with severe protein calorie malnutrition -Being seen by speech therapy. High aspiration risk.  NPO.   Anemia of  chronic disease -Secondary to history of malignancy and the sequela of prior chemotherapy. No current indication for transfusion.   Scalp furuncle -Draining pus.  Pus +MRSA.  Wet to dry dressing changes.  Has been on doxycycline and Levaquin, but organism was resistant. Now on Septra.   Urinary retention with ARF -Bump in creatinine associated with acute urinary retention.  Bump may be from prior diuresis. -Renal ultrasound done 06/18/2013. Within normal limits. Creatinine slowly trending down. -Seen by neurology with recommendations to maintain Foley catheter. Urinary retention thought to be from neurological dysfunction from brain metastasis.   Hypokalemia -Monitor and replace as needed.   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  Resolved with IVF.   COPD UNSPECIFIED -Unclear diagnosis.   Lung cancer -Seen by [name] 06/13/2013, and he will assist with ongoing management. -Current therapy: Xgeva 120 mg subcutaneously given on a monthly basis for bone metastasis status post 16 treatments. -Given clinical deterioration, will get palliative care team involved for assistance with symptom management, discharge planning to residential hospice facility.   Multiple brain mets -Receiving whole brain irradiation under the care of Dr. Michell Heinrich. -Continue Decadron.   HCAP (healthcare-associated pneumonia) -Treated with vancomycin and Zosyn---now on oral doxycycline/Levaquin.   Atrial fibrillation with rapid ventricular response -Initially treated with a Cardizem drip, now on intermittent dose metoprolol as needed.  Code Status: DNR Family Communication: 2 sisters updated at the bedside: McCurtain Sink [phone]) and Rose [phone] or [phone]).   Disposition Plan: Home when stable.   Medical Consultants:  Dr. Max Fickle, Critical care  Dr. [name], Oncology  Other Consultants:  None.  Anti-infectives:  Vancomycin 06/12/2013--->06/16/13  Zosyn  06/12/2013--->06/16/13  Doxycycline 06/16/13--->06/24/13  Levaquin 06/16/13---> 06/25/2013  Septra 06/24/2013--->    HPI/Subjective: Makayla [name]  [name] says her breathing is better.  She denies pain, nausea or vomiting.  Her scalp furuncles are healing.  Objective: Filed Vitals:   06/25/13 2000 06/25/13 2316 06/26/13 0424 06/26/13 0545  BP: 118/82 124/82 114/67   Pulse: 117 116 116   Temp: 97.7 F (36.5 C) 97.8 F (36.6 C) 97.8 F (36.6 C)   TempSrc: Oral Oral Oral   Resp: 22 23 25    Height:      Weight:    123 lb 7.3 oz (56 kg)  SpO2: 100% 98% 100%     Intake/Output Summary (Last 24 hours) at 06/26/13 0710 Last data filed at 06/26/13 0600  Gross per 24 hour  Intake    550 ml  Output   1665 ml  Net  -1115 ml    Exam: Gen:  NAD Cardiovascular:  Regular, tachycardic Respiratory:  Lungs CTAB Gastrointestinal:  Abdomen soft, NT/ND, + BS Extremities:  +edema  Data Reviewed: Basic Metabolic Panel:  Recent Labs Lab 06/22/13 0400 06/23/13 0345 06/24/13 0340 06/25/13 0545 06/26/13 0330  NA 147* 147* 153* 150* 144  K 3.3* 3.9 3.8 3.5 3.3*  CL 112 113* 117* 111 105  CO2 21 25 25 28 28   GLUCOSE 113* 120* 98 114* 94  BUN 56* 59* 54* 44* 40*  CREATININE 1.72* 1.70* 1.46* 1.42* 1.31*  CALCIUM 7.8* 7.9* 7.7* 7.7* 7.6*   GFR Estimated Creatinine Clearance: 37.3 ml/min (by C-G formula based on Cr of 1.31).  CBC:  Recent Labs Lab 06/22/13 0400 06/23/13 0345 06/24/13 0340 06/25/13 0545 06/26/13 0330  WBC 16.7* 18.4* 13.5* 13.0* 12.8*  HGB 9.5* 9.5* 8.7* 9.2* 9.3*  HCT 28.6* 29.0* 26.9* 28.6* 28.6*  MCV 88.3 90.3 89.7 90.2 89.4  PLT 15* 10* 11* 12* 16*   Microbiology Recent Results (from the past 240 hour(s))  WOUND CULTURE     Status: None   Collection Time    06/18/13 11:34 AM      Result Value Range Status   Specimen Description HEAD   Final   Special Requests Immunocompromised   Final   Gram Stain     Final   Value: RARE WBC PRESENT,BOTH PMN AND  MONONUCLEAR     NO SQUAMOUS EPITHELIAL CELLS SEEN     RARE GRAM POSITIVE COCCI     IN PAIRS     Performed at [facility]   Culture     Final   Value: FEW METHICILLIN RESISTANT STAPHYLOCOCCUS AUREUS     Note: RIFAMPIN AND GENTAMICIN SHOULD NOT BE USED AS SINGLE DRUGS FOR TREATMENT OF STAPH INFECTIONS. CRITICAL RESULT CALLED TO, READ BACK BY AND VERIFIED WITH: RUBY JOHNSON BY INGRAM A 06/21/13 11AM     Performed at [facility]   Report Status 06/21/2013 FINAL   Final   Organism ID, Bacteria METHICILLIN RESISTANT STAPHYLOCOCCUS AUREUS   Final     Procedures and Diagnostic Studies:  Dg Chest 2 View 06/12/2013   *RADIOLOGY REPORT*  Clinical Data: Shortness of breath.  History of right lung cancer.  CHEST - 2 VIEW  Comparison: Chest CT 06/12/2013  Findings: Persistent volume loss and right lung opacity identified. Nodular opacities are identified at the left lung base, better seen on CT of the same day.  There is gaseous distension of the stomach.  IMPRESSION:  1.  Stable opacity/volume loss in the right lung. 2.  Nodularity in the left lung base, better seen on CT of the same day.  This represents a change  since prior studies.   Original Report Authenticated By: Norva Pavlov, M.D.    Ct Angio Chest Pe W/cm &/or Wo Cm 06/12/2013   *RADIOLOGY REPORT*  Clinical Data: History of COPD and lung cancer with osseous metastatic disease, now with shortness of breath, evaluate for pulmonary embolism  CT ANGIOGRAPHY CHEST  Technique:  Multidetector CT imaging of the chest using the standard protocol during bolus administration of intravenous contrast. Multiplanar reconstructed images including MIPs were obtained and reviewed to evaluate the vascular anatomy.  Contrast: OMNIPAQUE IOHEXOL 350 MG/ML SOLN  Comparison: CT of the chest, abdomen and pelvis - 05/13/2013; chest CT - 08/24/2012; 03/09/2012  Findings:  There is adequate opacification of the pulmonary arterial system of the main  pulmonary artery measuring 677 HU. There is expected rapid tapering of the near occlusion of the right main pulmonary artery due to chronic complete atelectasis/collapse of the right lung. There are no discrete filling defects within the pulmonary arterial tree to suggest pulmonary embolism.  The pulmonary artery remains enlarged measuring 3.4 cm in diameter.  Normal heart size.  No definite pericardial effusion.  Normal caliber thoracic aorta.  Possible bovine configuration of the aortic arch.  No definite thoracic aortic dissection or periaortic stranding.  ---------------------------------------------------------  Nonvascular findings:  There is persistent findings of complete atelectasis/collapse of the remaining right lung.  The previously described mass within the atelectatic remaining right lung is difficult to definitively measure on the present examination due to associated volume loss and heterogeneous enhancement of the remaining pulmonary parenchyma.  There is no discrete enhancing nodularity of the right sided pleural. Grossly unchanged appearance of approximately 0.6 cm left-sided distal periaortic lymph node (image 51, series five). No new mediastinal or contralateral left hilar lymphadenopathy.  There is been interval development of debris within the residua of the right mainstem bronchus (images 29 - 31, series eight).  This finding is associated with development of ill-defined nodular opacities within the aerated left lung, most conspicuously within the left lower lobe many of which demonstrate a tree in bud configuration (representative images 39 and 43, series eight). While no discrete filling defects are seen within the left main stem bronchi, these constellation of findings are worrisome for aspiration.  Early arterial phase evaluation of the superior aspect of the abdomen suggests a possible small hiatal hernia.  Redemonstrated extensive osseous metastatic disease to the thoracic spine again  most conspicuously involving the C5 and T2 vertebral bodies as well as the right pedicle of T10 as well as the imaged proximal aspect of the left humerus.  Grossly unchanged severe compression deformity involving the posterior aspect of the T8 vertebral body.  IMPRESSION:  1.  No evidence of pulmonary embolism. 2.  Grossly unchanged complete atelectasis/collapse of the right lower lobe.  Previously identified heterogeneously enhancing right- sided lung mass is not well demonstrated on the present examination secondary to the adjacent atelectatic lung.  3.  Interval development of a large amount of debris within the residua of the right mainstem bronchus with associated development of ill-defined nodular tree in bud opacities primarily within the contralateral left lower lobe worrisome for aspiration.  4.  Grossly unchanged osseous metastatic disease to the chest as detailed above.  5.  Chronic enlargement of the caliber of the main pulmonary artery which is nonspecific in the setting of complete collapse/atelectasis in the right lung but could be indicative of pulmonary arterial hypertension.   Original Report Authenticated By: Tacey Ruiz, MD  Dg Chest Portable 1 View 06/12/2013   *RADIOLOGY REPORT*  Clinical Data: Weakness.  Lung cancer  PORTABLE CHEST - 1 VIEW  Comparison: 06/12/2013  Findings: There is a large right pleural effusion.  This is similar in volume to the previous exam.  There is complete opacification of the right hemithorax.  Nodularity in the left lower lobe is unchanged.  Diffuse bronchitic changes are identified.  There is a sclerotic metastasis identified within the proximal left humerus.  IMPRESSION:  1.  No change and large right pleural effusion with complete opacification of the right hemithorax. 2.  Left lung nodularity as before.   Original Report Authenticated By: Signa Kell, M.D.    Renal ultrasound 06/18/2013: Clinical Data: History of acute renal failure and urinary  retention. RENAL/URINARY TRACT ULTRASOUND COMPLETE Comparison: CT 06/18/2013. Findings: Right Kidney: Right renal length is 10.8 cm. Left Kidney: Left renal length is 10.7 cm. Examination of each kidney shows no evidence of hydronephrosis, solid or cystic mass, calculus, parenchymal loss, or parenchymal textural abnormality. Bladder: Urinary bladder is drained by a Foley catheter. IMPRESSION: No renal abnormality is identified.     PORTABLE CHEST - 1 VIEW Comparison: 06/14/2013 Findings: Completeness opacification of the right hemithorax persists, consistent with right lung collapse. Airspace disease in the left lung has progressed in the interval, compatible with worsening infection or edema. Imaged bony structures of the thorax are intact. IMPRESSION: Worsening airspace disease in the left lung. Original Report Authenticated By: Kennith Center, M.D.   PORTABLE CHEST - 1 VIEW COMPARISON: 06/19/2013 chest x-ray. 06/12/2013 CT. FINDINGS: Complete consolidation of the right hemithorax with mediastinal shift to the right. Obstructing lesion may be present. Diffuse left lung airspace disease. Etiology indeterminate. This may represent infectious infiltrate. Pulmonary vascular congestion/ shunting of blood may partially contribute to this appearance. Interstitial spread of tumor not excluded. Left-sided pleural effusion may be present. No gross left-sided pneumothorax. Osseous metastatic disease. IMPRESSION: Complete consolidation of the right hemithorax with mediastinal shift to the right. Obstructing lesion may be present. Diffuse left lung airspace disease. Etiology indeterminate. This may represent infectious infiltrate. Pulmonary vascular congestion/ shunting of blood may partially contribute to this appearance. Interstitial spread of tumor not excluded. Left-sided pleural effusion may be present. Osseous metastatic disease. Electronically Signed By: Bridgett Larsson On: 06/21/2013 10:47    PORTABLE CHEST - 1 VIEW  COMPARISON: June 21, 2013 FINDINGS: Complete collapse the right lung again noted. The heart and mediastinum is shifted to the right. Diffuse bilateral infiltrate in the left lung may represent pneumonia or edema. No change from the prior study. IMPRESSION: No interval change. Electronically Signed By: Marlan Palau M.D. On: 06/23/2013 11:59   Scheduled Meds: . acyclovir ointment   Topical Q4H  . albuterol  2.5 mg Nebulization Q4H  . antiseptic oral rinse  15 mL Mouth Rinse q12n4p  . chlorhexidine  15 mL Mouth Rinse BID  . dexamethasone  4 mg Intravenous Daily  . furosemide  20 mg Intravenous Daily  . ipratropium  0.5 mg Nebulization Q4H  . sodium chloride  3 mL Intravenous Q12H  . sulfamethoxazole-trimethoprim  300 mg Intravenous Q12H   Continuous Infusions: . dextrose 50 mL/hr at 06/25/13 1055    Time spent: 35 minutes with > 50% of time discussing current diagnostic test results, clinical impression and plan of care and discussion of role of palliative care with sisters.     LOS: 14 days   Isaiah Cianci  [facility] Pager [phone].   *Please note that the  hospitalists switch teams on Wednesdays. Please call the flow manager at [phone] if you are having difficulty reaching the hospitalist taking care of this patient as she can update you and provide the most up-to-date pager number of provider caring for the patient. If 8PM-8AM, please contact night-coverage at [email or web address], password [email or web address]  06/26/2013, 7:10 AM

## 2013-06-26 NOTE — Progress Notes (Signed)
Merlyn Albert, RN informed that PICC exchange to right arm by IR has tip in right subclavian vein. No chemo or nutrition should be given through line.

## 2013-06-26 NOTE — Procedures (Signed)
Fluoroscopic guided exchange of existing RUE PICC for new 27 cm TL PICC. Tip in right subclavian vein. Right central venous obstruction noted on venography- see PACS report for full details. No immediate complications.

## 2013-06-26 NOTE — Progress Notes (Signed)
Nutrition Brief Note  Chart reviewed. Pt now transitioning to comfort care.  No further nutrition interventions warranted at this time.  Please re-consult as needed.   Traquan Duarte MS, RD, LDN Pager: [phone] After-hours pager: [phone]    

## 2013-06-26 NOTE — Progress Notes (Signed)
Pt report given to Fred,RN on 3 east. Pt alert to self and place, still has memory impairment; stage II to sac rum still with avleyn dsg clean dry and intact, medicated for pain before transfer to the floor, VSS, NAD, all personal belongings sent with pt and the rest with her son.

## 2013-06-27 DIAGNOSIS — Z515 Encounter for palliative care: Secondary | ICD-10-CM

## 2013-06-27 DIAGNOSIS — R52 Pain, unspecified: Secondary | ICD-10-CM

## 2013-06-27 DIAGNOSIS — C3490 Malignant neoplasm of unspecified part of unspecified bronchus or lung: Secondary | ICD-10-CM

## 2013-06-27 DIAGNOSIS — R5383 Other fatigue: Secondary | ICD-10-CM

## 2013-06-27 DIAGNOSIS — R5381 Other malaise: Secondary | ICD-10-CM

## 2013-06-27 MED ORDER — SODIUM CHLORIDE 0.9 % IV SOLN
0.5000 mg/h | INTRAVENOUS | Status: DC
  Administered 2013-06-27: 0.5 mg/h via INTRAVENOUS
  Filled 2013-06-27: qty 10

## 2013-06-27 MED ORDER — IPRATROPIUM BROMIDE 0.02 % IN SOLN
0.5000 mg | Freq: Four times a day (QID) | RESPIRATORY_TRACT | Status: DC
  Administered 2013-06-27 – 2013-06-28 (×6): 0.5 mg via RESPIRATORY_TRACT
  Filled 2013-06-27 (×6): qty 2.5

## 2013-06-27 MED ORDER — MORPHINE BOLUS VIA INFUSION
2.0000 mg | INTRAVENOUS | Status: DC | PRN
  Administered 2013-06-27 – 2013-06-28 (×6): 2 mg via INTRAVENOUS
  Filled 2013-06-27: qty 2

## 2013-06-27 MED ORDER — ALBUTEROL SULFATE (5 MG/ML) 0.5% IN NEBU
2.5000 mg | INHALATION_SOLUTION | Freq: Four times a day (QID) | RESPIRATORY_TRACT | Status: DC
  Administered 2013-06-27 – 2013-06-28 (×6): 2.5 mg via RESPIRATORY_TRACT
  Filled 2013-06-27 (×6): qty 0.5

## 2013-06-27 NOTE — Progress Notes (Addendum)
Palliative Medicine Team Progress Note  Asucena is actively dying of advanced lung cancer. She is much less responsive this AM, she has a weak cough, and more difficulty managing her secretions. Her PICC line is functioning now. Her breathing is shallow and labored, distant gaze, difficulty focusing. She complains of being cold. Tremors+. Difficult this AM with dressing change.  Assessment/Active Symptoms:  1. Dyspnea 2. Pain, generalized 3. Cancer related Fatigue 4. Failure to Thrive 5. Terminal Dysphagia 6. Decubitus stage 2  7. Scalp furuncle  Recs:  Started morphine infusion  Discontinued all unnecessary meds  Palliative prophylaxis  Prognosis: Hours, days. At this point she may not be stable for transport, if residential hospice bed becomes available will need to check with family to see if the want to move her give her risk for dying in transport. This family would benefit from Hospice including after death bereavement.   Patient sister requesting information on cremation and post-mortem care-will ask CSW to provide resources.  Anderson Malta, DO Palliative Medicine

## 2013-06-27 NOTE — Progress Notes (Signed)
CSW continuing to follow.  CSW spoke with [facility] and hospice home did not have any beds today.   Per PMT MD note, Pt actively dying and prognosis is hours to days.   CSW noted from PMT MD note that pt sister requesting information on cremation.   CSW visited pt room and pt sons present at this time.   CSW provided [facility] Information you may find helpful when someone you love dies brochure, list of [employer] that included addresses and phone numbers, and provided resource for bereavement support at [facility] of the [place] in [employer] as a resource for pt family.  Pt sons appear to be coping appropriately and CSW offered support.  CSW to continue to follow.  Jacklynn Lewis, MSW, LCSWA  Clinical Social Work [phone]

## 2013-06-27 NOTE — Progress Notes (Signed)
[facility] PROGRESS NOTE  Makayla [name] WUJ:[id number] DOB: 04/01/1951 DOA: 06/12/2013 PCP: [name], MD  Brief narrative: Makayla [name] is an 62 y.o. female with a PMH of metastatic lung cancer who was admitted on 06/12/2013 with acute respiratory failure secondary to flash pulmonary edema from atrial fibrillation with rapid ventricular response in the setting of acute pneumonia. She has been stable off BiPAP, with plans to discharge 06/17/2013 however she has developed acute urinary retention of unclear etiology. She has failed attempts at voiding trials and urology recommended keeping foley in with change every month. Her hospital course has been complicated with acute respiratory distress and desaturation. She was transferred to SDU 06/19/2013 and has required BiPAP to keep O2 saturation above 90%. She has been treated for healthcare associated pneumonia, and at this point, her clinical status is tenuous. She has been moved to the floor and is now receiving full comfort care.    Assessment/Plan: Principal Problem:   Acute respiratory failure with hypoxia / SOB (shortness of breath) / sepsis -Secondary to HCAP and flash pulmonary edema in the setting of atrial fibrillation with rapid ventricular response. Initially on vancomycin/Zosyn.  Switched to oral doxycycline/Levaquin 06/16/13. -Initially treated with BiPAP, now on ventimask to keep O2 saturation > 100%. -Diuresed to address flash pulmonary edema.  -Pulmonology/critical care initially following patient. Signed off 06/14/2013. -CT scan consistent with infection/possible aspiration. No evidence of pulmonary embolism.  No significant improvement based on repeat CXR.  -Seen by palliative care team 06/26/2013 with full comfort measures instituted. Active Problems:   Thrombocytopenia -No history of prior chemo to explain.  May be from sepsis, bone marrow failure.   Dysphasia with severe protein calorie malnutrition -Being seen by speech  therapy. High aspiration risk.  NPO.   Anemia of chronic disease -Secondary to history of malignancy and the sequela of prior chemotherapy. No current indication for transfusion.   Scalp furuncle -Draining pus.  Pus +MRSA.  Wet to dry dressing changes.  Has been on doxycycline and Levaquin, but organism was resistant. Now on Septra.   Urinary retention with ARF -Bump in creatinine associated with acute urinary retention.  Bump may be from prior diuresis. -Renal ultrasound done 06/18/2013. Within normal limits. Creatinine slowly trending down. -Seen by neurology with recommendations to maintain Foley catheter. Urinary retention thought to be from neurological dysfunction from brain metastasis.   Hypokalemia -Monitor and replace as needed.   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  Resolved with IVF.   COPD UNSPECIFIED -Unclear diagnosis.   Lung cancer -Seen by [name] 06/13/2013, and he will assist with ongoing management. -Recent therapy: Xgeva 120 mg subcutaneously given on a monthly basis for bone metastasis status post 16 treatments. -Seen by the palliative care team on 06/26/2013 persistence with symptom management and residential hospice placement. Now on a morphine drip for comfort and it is unclear she will be stable enough for discharge to residential hospice given her clinical deterioration.   Multiple brain mets -Status post whole brain irradiation under the care of Dr. Michell Heinrich. -Continue Decadron.   HCAP (healthcare-associated pneumonia) -Treated with vancomycin and Zosyn, followed by doxycycline/Levaquin.   Atrial fibrillation with rapid ventricular response -Initially treated with a Cardizem drip, now on intermittent dose metoprolol as needed.  Code Status: DNR Family Communication: Sons and sisters updated at bedside.   Disposition Plan: Residential hospice.   Medical Consultants:  Dr. Max Fickle, Critical care  Dr. [name], Oncology  Dr. Julaine Fusi, Palliative care  Other Consultants:  None.  Anti-infectives:  Vancomycin 06/12/2013--->06/16/13  Zosyn 06/12/2013--->06/16/13  Doxycycline 06/16/13--->06/24/13  Levaquin 06/16/13---> 06/25/2013  Septra 06/24/2013--->    HPI/Subjective: Makayla [name] says her breathing is better.  She denies pain, nausea or vomiting.  Her family is at bedside and reports that she has had some intermittent restlessness and muscle spasms.  Objective: Filed Vitals:   06/26/13 1245 06/26/13 1623 06/27/13 0517 06/27/13 0719  BP: 104/85  70/51   Pulse: 125  120   Temp: 98 F (36.7 C)  98.9 F (37.2 C)   TempSrc: Oral  Axillary   Resp: 18  20   Height:      Weight: 57.6 kg (126 lb 15.8 oz)     SpO2: 100% 98% 96% 98%    Intake/Output Summary (Last 24 hours) at 06/27/13 0814 Last data filed at 06/27/13 0519  Gross per 24 hour  Intake      0 ml  Output   1200 ml  Net  -1200 ml    Exam: Gen:  NAD Cardiovascular:  Regular, tachycardic Respiratory:  Lungs CTAB Gastrointestinal:  Abdomen soft, NT/ND, + BS Extremities:  +edema  Data Reviewed: Basic Metabolic Panel:  Recent Labs Lab 06/22/13 0400 06/23/13 0345 06/24/13 0340 06/25/13 0545 06/26/13 0330  NA 147* 147* 153* 150* 144  K 3.3* 3.9 3.8 3.5 3.3*  CL 112 113* 117* 111 105  CO2 21 25 25 28 28   GLUCOSE 113* 120* 98 114* 94  BUN 56* 59* 54* 44* 40*  CREATININE 1.72* 1.70* 1.46* 1.42* 1.31*  CALCIUM 7.8* 7.9* 7.7* 7.7* 7.6*   GFR Estimated Creatinine Clearance: 37.3 ml/min (by C-G formula based on Cr of 1.31).  CBC:  Recent Labs Lab 06/22/13 0400 06/23/13 0345 06/24/13 0340 06/25/13 0545 06/26/13 0330  WBC 16.7* 18.4* 13.5* 13.0* 12.8*  HGB 9.5* 9.5* 8.7* 9.2* 9.3*  HCT 28.6* 29.0* 26.9* 28.6* 28.6*  MCV 88.3 90.3 89.7 90.2 89.4  PLT 15* 10* 11* 12* 16*   Microbiology Recent Results (from the past 240 hour(s))  WOUND CULTURE     Status: None   Collection Time    06/18/13 11:34 AM      Result Value Range  Status   Specimen Description HEAD   Final   Special Requests Immunocompromised   Final   Gram Stain     Final   Value: RARE WBC PRESENT,BOTH PMN AND MONONUCLEAR     NO SQUAMOUS EPITHELIAL CELLS SEEN     RARE GRAM POSITIVE COCCI     IN PAIRS     Performed at [facility]   Culture     Final   Value: FEW METHICILLIN RESISTANT STAPHYLOCOCCUS AUREUS     Note: RIFAMPIN AND GENTAMICIN SHOULD NOT BE USED AS SINGLE DRUGS FOR TREATMENT OF STAPH INFECTIONS. CRITICAL RESULT CALLED TO, READ BACK BY AND VERIFIED WITH: RUBY JOHNSON BY INGRAM A 06/21/13 11AM     Performed at [facility]   Report Status 06/21/2013 FINAL   Final   Organism ID, Bacteria METHICILLIN RESISTANT STAPHYLOCOCCUS AUREUS   Final     Procedures and Diagnostic Studies:  Dg Chest 2 View 06/12/2013   *RADIOLOGY REPORT*  Clinical Data: Shortness of breath.  History of right lung cancer.  CHEST - 2 VIEW  Comparison: Chest CT 06/12/2013  Findings: Persistent volume loss and right lung opacity identified. Nodular opacities are identified at the left lung base, better seen on CT of the same day.  There is  gaseous distension of the stomach.  IMPRESSION:  1.  Stable opacity/volume loss in the right lung. 2.  Nodularity in the left lung base, better seen on CT of the same day.  This represents a change since prior studies.   Original Report Authenticated By: Norva Pavlov, M.D.    Ct Angio Chest Pe W/cm &/or Wo Cm 06/12/2013   *RADIOLOGY REPORT*  Clinical Data: History of COPD and lung cancer with osseous metastatic disease, now with shortness of breath, evaluate for pulmonary embolism  CT ANGIOGRAPHY CHEST  Technique:  Multidetector CT imaging of the chest using the standard protocol during bolus administration of intravenous contrast. Multiplanar reconstructed images including MIPs were obtained and reviewed to evaluate the vascular anatomy.  Contrast: OMNIPAQUE IOHEXOL 350 MG/ML SOLN  Comparison: CT of the chest, abdomen  and pelvis - 05/13/2013; chest CT - 08/24/2012; 03/09/2012  Findings:  There is adequate opacification of the pulmonary arterial system of the main pulmonary artery measuring 677 HU. There is expected rapid tapering of the near occlusion of the right main pulmonary artery due to chronic complete atelectasis/collapse of the right lung. There are no discrete filling defects within the pulmonary arterial tree to suggest pulmonary embolism.  The pulmonary artery remains enlarged measuring 3.4 cm in diameter.  Normal heart size.  No definite pericardial effusion.  Normal caliber thoracic aorta.  Possible bovine configuration of the aortic arch.  No definite thoracic aortic dissection or periaortic stranding.  ---------------------------------------------------------  Nonvascular findings:  There is persistent findings of complete atelectasis/collapse of the remaining right lung.  The previously described mass within the atelectatic remaining right lung is difficult to definitively measure on the present examination due to associated volume loss and heterogeneous enhancement of the remaining pulmonary parenchyma.  There is no discrete enhancing nodularity of the right sided pleural. Grossly unchanged appearance of approximately 0.6 cm left-sided distal periaortic lymph node (image 51, series five). No new mediastinal or contralateral left hilar lymphadenopathy.  There is been interval development of debris within the residua of the right mainstem bronchus (images 29 - 31, series eight).  This finding is associated with development of ill-defined nodular opacities within the aerated left lung, most conspicuously within the left lower lobe many of which demonstrate a tree in bud configuration (representative images 39 and 43, series eight). While no discrete filling defects are seen within the left main stem bronchi, these constellation of findings are worrisome for aspiration.  Early arterial phase evaluation of the  superior aspect of the abdomen suggests a possible small hiatal hernia.  Redemonstrated extensive osseous metastatic disease to the thoracic spine again most conspicuously involving the C5 and T2 vertebral bodies as well as the right pedicle of T10 as well as the imaged proximal aspect of the left humerus.  Grossly unchanged severe compression deformity involving the posterior aspect of the T8 vertebral body.  IMPRESSION:  1.  No evidence of pulmonary embolism. 2.  Grossly unchanged complete atelectasis/collapse of the right lower lobe.  Previously identified heterogeneously enhancing right- sided lung mass is not well demonstrated on the present examination secondary to the adjacent atelectatic lung.  3.  Interval development of a large amount of debris within the residua of the right mainstem bronchus with associated development of ill-defined nodular tree in bud opacities primarily within the contralateral left lower lobe worrisome for aspiration.  4.  Grossly unchanged osseous metastatic disease to the chest as detailed above.  5.  Chronic enlargement of the caliber of the  main pulmonary artery which is nonspecific in the setting of complete collapse/atelectasis in the right lung but could be indicative of pulmonary arterial hypertension.   Original Report Authenticated By: Tacey Ruiz, MD     Dg Chest Portable 1 View 06/12/2013   *RADIOLOGY REPORT*  Clinical Data: Weakness.  Lung cancer  PORTABLE CHEST - 1 VIEW  Comparison: 06/12/2013  Findings: There is a large right pleural effusion.  This is similar in volume to the previous exam.  There is complete opacification of the right hemithorax.  Nodularity in the left lower lobe is unchanged.  Diffuse bronchitic changes are identified.  There is a sclerotic metastasis identified within the proximal left humerus.  IMPRESSION:  1.  No change and large right pleural effusion with complete opacification of the right hemithorax. 2.  Left lung nodularity as before.    Original Report Authenticated By: Signa Kell, M.D.    Renal ultrasound 06/18/2013: Clinical Data: History of acute renal failure and urinary retention. RENAL/URINARY TRACT ULTRASOUND COMPLETE Comparison: CT 06/18/2013. Findings: Right Kidney: Right renal length is 10.8 cm. Left Kidney: Left renal length is 10.7 cm. Examination of each kidney shows no evidence of hydronephrosis, solid or cystic mass, calculus, parenchymal loss, or parenchymal textural abnormality. Bladder: Urinary bladder is drained by a Foley catheter. IMPRESSION: No renal abnormality is identified.     PORTABLE CHEST - 1 VIEW Comparison: 06/14/2013 Findings: Completeness opacification of the right hemithorax persists, consistent with right lung collapse. Airspace disease in the left lung has progressed in the interval, compatible with worsening infection or edema. Imaged bony structures of the thorax are intact. IMPRESSION: Worsening airspace disease in the left lung. Original Report Authenticated By: Kennith Center, M.D.   PORTABLE CHEST - 1 VIEW COMPARISON: 06/19/2013 chest x-ray. 06/12/2013 CT. FINDINGS: Complete consolidation of the right hemithorax with mediastinal shift to the right. Obstructing lesion may be present. Diffuse left lung airspace disease. Etiology indeterminate. This may represent infectious infiltrate. Pulmonary vascular congestion/ shunting of blood may partially contribute to this appearance. Interstitial spread of tumor not excluded. Left-sided pleural effusion may be present. No gross left-sided pneumothorax. Osseous metastatic disease. IMPRESSION: Complete consolidation of the right hemithorax with mediastinal shift to the right. Obstructing lesion may be present. Diffuse left lung airspace disease. Etiology indeterminate. This may represent infectious infiltrate. Pulmonary vascular congestion/ shunting of blood may partially contribute to this appearance. Interstitial spread of tumor not excluded. Left-sided  pleural effusion may be present. Osseous metastatic disease. Electronically Signed By: Bridgett Larsson On: 06/21/2013 10:47    PORTABLE CHEST - 1 VIEW COMPARISON: June 21, 2013 FINDINGS: Complete collapse the right lung again noted. The heart and mediastinum is shifted to the right. Diffuse bilateral infiltrate in the left lung may represent pneumonia or edema. No change from the prior study. IMPRESSION: No interval change. Electronically Signed By: Marlan Palau M.D. On: 06/23/2013 11:59   Scheduled Meds: . acyclovir ointment   Topical Q4H  . albuterol  2.5 mg Nebulization Q6H  . antiseptic oral rinse  15 mL Mouth Rinse q12n4p  . chlorhexidine  15 mL Mouth Rinse BID  . dexamethasone  4 mg Intravenous Daily  . furosemide  20 mg Intravenous Daily  . ipratropium  0.5 mg Nebulization Q6H  . sodium chloride  3 mL Intravenous Q12H  . sulfamethoxazole-trimethoprim  300 mg Intravenous Q12H   Continuous Infusions: . dextrose 50 mL/hr at 06/25/13 1055    Time spent: 25 minutes.     LOS: 15 days  [name],Makayla  [facility] Pager [phone].   *Please note that the hospitalists switch teams on Wednesdays. Please call the flow manager at [phone] if you are having difficulty reaching the hospitalist taking care of this patient as she can update you and provide the most up-to-date pager number of provider caring for the patient. If 8PM-8AM, please contact night-coverage at [email or web address], password [facility]  06/27/2013, 8:14 AM

## 2013-06-28 MED ORDER — CHLORHEXIDINE GLUCONATE 0.12 % MT SOLN: 15.0000 mL | Freq: Two times a day (BID) | OROMUCOSAL | Status: AC

## 2013-06-28 MED ORDER — ONDANSETRON HCL 4 MG/2ML IJ SOLN: 4.0000 mg | Freq: Four times a day (QID) | INTRAMUSCULAR | Status: AC | PRN

## 2013-06-28 MED ORDER — SODIUM CHLORIDE 0.9 % IV SOLN: 1.0000 mg/h | INTRAVENOUS | Status: AC

## 2013-06-28 MED ORDER — LORAZEPAM 2 MG/ML IJ SOLN: 1.0000 mg | INTRAMUSCULAR | Status: AC | PRN

## 2013-06-28 MED ORDER — IPRATROPIUM BROMIDE 0.02 % IN SOLN: 0.5000 mg | Freq: Four times a day (QID) | RESPIRATORY_TRACT | Status: AC

## 2013-06-28 MED ORDER — BISACODYL 10 MG RE SUPP: 10.0000 mg | Freq: Every day | RECTAL | Status: AC | PRN

## 2013-06-28 MED ORDER — ACYCLOVIR 5 % EX OINT: TOPICAL_OINTMENT | CUTANEOUS | Status: AC

## 2013-06-28 MED ORDER — ALBUTEROL SULFATE (5 MG/ML) 0.5% IN NEBU: 2.5000 mg | INHALATION_SOLUTION | RESPIRATORY_TRACT | Status: AC | PRN

## 2013-06-28 MED ORDER — MORPHINE BOLUS VIA INFUSION: 2.0000 mg | INTRAVENOUS | Status: AC | PRN

## 2013-06-28 MED ORDER — BIOTENE DRY MOUTH MT LIQD: 15.0000 mL | Freq: Two times a day (BID) | OROMUCOSAL | Status: AC

## 2013-06-28 NOTE — Progress Notes (Signed)
Patient discharged to [facility].  Morphine drip discontinued.  Wasted 30 ml and witnessed by Kathlene November, RN.  Allayne Butcher [facility]  06/28/2013  2:29 PM

## 2013-06-28 NOTE — Progress Notes (Signed)
06/28/13 1340 Nurse from [facility] called for report. Report given to RN. EMS to transport patient to [facility]. Family at bedside.

## 2013-06-28 NOTE — Progress Notes (Signed)
Agree with note. 

## 2013-06-28 NOTE — Progress Notes (Signed)
CSW received notification that [facility] has bed available for pt today.   CSW notified MD who stated that pt stable for transfer and pt family agreeable to transfer to [facility].  CSW facilitated pt discharge needs including faxing pt discharge information to [facility], discussing with pt sons and pt family at bedside, providing RN phone number to call report, and arranging ambulance transport for pt to [facility] (Service Request ID#: [id number]).  No further social work needs identified at this time.  CSW signing off.   Jacklynn Lewis, MSW, LCSWA  Clinical Social Work [phone]

## 2013-06-28 NOTE — Discharge Summary (Signed)
Physician Discharge Summary  Makayla [name] AVW:[id number] DOB: 10-17-1950 DOA: 06/12/2013  PCP: [name], MD  Admit date: 06/12/2013 Discharge date: 06/28/2013  Recommendations for Outpatient Follow-up:  1. Patient is being discharged to [facility] residential hospice facility.  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  Diet recommendation: General.  History of present illness:  Makayla [name] is an 63 y.o. female with a PMH of metastatic lung cancer who was admitted on 06/12/2013 with acute respiratory failure secondary to flash pulmonary edema from atrial fibrillation with rapid ventricular response in the setting of acute pneumonia. She has been stable off BiPAP, with plans to discharge 06/17/2013 however she has developed acute urinary retention of unclear etiology. She has failed attempts at voiding trials and urology recommended keeping foley in with change every month. Her hospital course has been complicated with acute respiratory distress and desaturation. She was transferred to SDU 06/19/2013 and required BiPAP to keep O2 saturation above 90%. She has been treated for healthcare associated pneumonia, and at this point, her clinical status is stable but terminal. She has been moved to the floor and is now receiving full comfort care.   Hospital Course  by problem:  Principal Problem:  Acute respiratory failure with hypoxia / SOB (shortness of breath) / sepsis  -Secondary to HCAP and flash pulmonary edema in the setting of atrial fibrillation with rapid ventricular response. Initially on vancomycin/Zosyn. Switched to oral doxycycline/Levaquin 06/16/13. Completed a full course of antibiotics. -Initially treated with BiPAP, then required ventimask to keep O2 saturation > 100%. Now on nasal cannula oxygen. -Diuresed to address flash pulmonary edema.  -Pulmonology/critical care initially following patient. Signed off 06/14/2013.  -CT scan consistent with infection/possible aspiration. No evidence of pulmonary embolism. No significant improvement based on repeat CXR.  -Seen by palliative care team 06/26/2013 with full comfort measures instituted.  Active Problems:  Thrombocytopenia  -No history of prior chemo to explain. May be from sepsis, bone marrow failure.  Dysphasia with severe protein calorie malnutrition  -Being seen by speech therapy. High aspiration risk. Comfort feeds as tolerated.  Anemia of chronic disease  -Secondary to history of malignancy and the sequela of prior chemotherapy. No current indication for transfusion.  Scalp furuncle  -Now healed. Pus +MRSA. Treated with Septra.  Urinary retention with ARF  -Bump in creatinine associated with acute urinary retention. Bump may be from prior diuresis.  -Renal ultrasound done 06/18/2013. Within normal limits. Creatinine slowly trending down.  -Seen by neurology with recommendations to maintain Foley catheter. Urinary retention thought to be from neurological dysfunction from brain metastasis.  Hypokalemia  -Repleted. 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 Resolved with IVF.  COPD UNSPECIFIED  -Unclear diagnosis, but now on nebulized bronchodilator therapy.  Lung cancer  -Seen by oncology while in the  hospital.  -Recent therapy: Xgeva 120 mg subcutaneously given on a monthly  basis for bone metastasis status post 16 treatments.  -Seen by the palliative care team on 06/26/2013 persistence with symptom management and residential hospice placement. Now on a morphine drip for comfort with plans to discharge to residential hospice facility.  Multiple brain mets  -Status post whole brain irradiation under the care of Dr. Michell Heinrich.  -Continue Decadron.  HCAP (healthcare-associated pneumonia)  -Treated with vancomycin and Zosyn, followed by doxycycline/Levaquin.  Atrial fibrillation with rapid ventricular response  -Initially treated with a Cardizem drip, now on intermittent dose metoprolol as needed.  Medical Consultants:  Dr. Max Fickle, Critical care  Dr. [name], Oncology  Dr. Julaine Fusi, Palliative care   Discharge Exam: Filed Vitals:   06/28/13 0603  BP: 103/66  Pulse: 109  Temp: 97.3 F (36.3 C)  Resp: 16   Filed Vitals:   06/27/13 1938 06/28/13 0104 06/28/13 0603 06/28/13 0700  BP:   103/66   Pulse:   109   Temp:   97.3 F (36.3 C)   TempSrc:   Oral   Resp:   16   Height:      Weight:      SpO2: 86% 96% 98% 98%    Gen:  NAD, weak, lethargic Cardiovascular:  Tachycardic Respiratory: Lungs CTAB, diminished in the bases Gastrointestinal: Abdomen soft, NT/ND with normal active bowel sounds. Extremities: Trace edema   Discharge Instructions  Discharge Orders   Future Appointments Provider Department Dept Phone   07/08/2013 3:00 PM [facility] In[facility] Nurse [facility] [phone]   Future Orders Complete By Expires   Call MD for:  persistant nausea and vomiting  As directed    Call MD for:  severe uncontrolled pain  As directed    Diet general  As directed    Increase activity slowly  As directed        Medication List    STOP taking these medications       calcium-vitamin D 500-200 MG-UNIT per tablet  Commonly known as:  OSCAL WITH D     ferrous sulfate 325 (65 FE) MG tablet      Hydrocodone-Acetaminophen 5-300 MG Tabs     magic mouthwash w/lidocaine Soln     Magnesium 250 MG Tabs     morphine 30 MG 12 hr tablet  Commonly known as:  MS CONTIN  Replaced by:  morphine 5 mg/mL Soln     neomycin-bacitracin-polymyxin [phone] ointment     sucralfate 1 GM/10ML suspension  Commonly known as:  CARAFATE      TAKE these medications       ABREVA 10 % Crea  Generic drug:  Docosanol  Apply 1 application topically as needed. Blister/scab on bottom lip     acyclovir ointment 5 %  Commonly known as:  ZOVIRAX  Apply topically every 4 (four) hours.     albuterol (5 MG/ML) 0.5% nebulizer solution  Commonly known as:  PROVENTIL  Take 0.5 mLs (2.5 mg total) by nebulization every 2 (two) hours as needed for wheezing or shortness of breath.     ALPRAZolam 0.5 MG tablet  Commonly known as:  XANAX  Take 1 mg by mouth at bedtime as needed for sleep.     antiseptic oral rinse Liqd  15 mLs by Mouth Rinse route 2 times daily at 12 noon and 4 pm.     bisacodyl 10 MG suppository  Commonly known as:  DULCOLAX  Place 1 suppository (10 mg total) rectally daily as needed.     chlorhexidine 0.12 % solution  Commonly known as:  PERIDEX  Use as directed 15 mLs in the mouth or throat 2 (two) times daily.     clindamycin 1 % external solution  Commonly known as:  CLEOCIN T  Apply 1 application topically 2 (two) times daily as needed (for scalp lesions).     dexamethasone 4 MG tablet  Commonly known as:  DECADRON  Take 4 mg by mouth daily.     ipratropium 0.02 % nebulizer solution  Commonly known as:  ATROVENT  Take 2.5 mLs (0.5 mg total) by nebulization every 6 (six) hours.     LORazepam 2 MG/ML injection  Commonly known as:  ATIVAN  Inject 0.5 mLs (1 mg total) into the vein every 4 (four) hours as needed for anxiety.     morphine 5 mg/mL Soln  Inject 2 mg into the vein every 30 (thirty) minutes as needed.     ondansetron 4 MG/2ML Soln injection  Commonly known as:   ZOFRAN  Inject 2 mLs (4 mg total) into the vein every 6 (six) hours as needed for nausea.     sodium chloride 0.9 % SOLN 90 mL with morphine 10 MG/ML SOLN 100 mg  Inject 1 mg/hr into the vein continuous.          The results of significant diagnostics from this hospitalization (including imaging, microbiology, ancillary and laboratory) are listed below for reference.    Significant Diagnostic Studies: Dg Chest 2 View  06/12/2013   *RADIOLOGY REPORT*  Clinical Data: Shortness of breath.  History of right lung cancer.  CHEST - 2 VIEW  Comparison: Chest CT 06/12/2013  Findings: Persistent volume loss and right lung opacity identified. Nodular opacities are identified at the left lung base, better seen on CT of the same day.  There is gaseous distension of the stomach.  IMPRESSION:  1.  Stable opacity/volume loss in the right lung. 2.  Nodularity in the left lung base, better seen on CT of the same day.  This represents a change since prior studies.   Original Report Authenticated By: Norva Pavlov, M.D.   Ct Angio Chest Pe W/cm &/or Wo Cm  06/12/2013   *RADIOLOGY REPORT*  Clinical Data: History of COPD and lung cancer with osseous metastatic disease, now with shortness of breath, evaluate for pulmonary embolism  CT ANGIOGRAPHY CHEST  Technique:  Multidetector CT imaging of the chest using the standard protocol during bolus administration of intravenous contrast. Multiplanar reconstructed images including MIPs were obtained and reviewed to evaluate the vascular anatomy.  Contrast: OMNIPAQUE IOHEXOL 350 MG/ML SOLN  Comparison: CT of the chest, abdomen and pelvis - 05/13/2013; chest CT - 08/24/2012; 03/09/2012  Findings:  There is adequate opacification of the pulmonary arterial system of the main pulmonary artery measuring 677 HU. There is expected rapid tapering of the near occlusion of the right main pulmonary artery due to chronic complete atelectasis/collapse of the right lung. There are no  discrete filling defects within the pulmonary arterial tree to suggest pulmonary embolism.  The pulmonary artery remains enlarged measuring 3.4 cm in diameter.  Normal heart size.  No definite pericardial effusion.  Normal caliber thoracic aorta.  Possible bovine configuration of the aortic arch.  No definite thoracic aortic dissection or periaortic stranding.  ---------------------------------------------------------  Nonvascular findings:  There is persistent findings of complete atelectasis/collapse of the remaining right lung.  The previously described  mass within the atelectatic remaining right lung is difficult to definitively measure on the present examination due to associated volume loss and heterogeneous enhancement of the remaining pulmonary parenchyma.  There is no discrete enhancing nodularity of the right sided pleural. Grossly unchanged appearance of approximately 0.6 cm left-sided distal periaortic lymph node (image 51, series five). No new mediastinal or contralateral left hilar lymphadenopathy.  There is been interval development of debris within the residua of the right mainstem bronchus (images 29 - 31, series eight).  This finding is associated with development of ill-defined nodular opacities within the aerated left lung, most conspicuously within the left lower lobe many of which demonstrate a tree in bud configuration (representative images 39 and 43, series eight). While no discrete filling defects are seen within the left main stem bronchi, these constellation of findings are worrisome for aspiration.  Early arterial phase evaluation of the superior aspect of the abdomen suggests a possible small hiatal hernia.  Redemonstrated extensive osseous metastatic disease to the thoracic spine again most conspicuously involving the C5 and T2 vertebral bodies as well as the right pedicle of T10 as well as the imaged proximal aspect of the left humerus.  Grossly unchanged severe compression deformity  involving the posterior aspect of the T8 vertebral body.  IMPRESSION:  1.  No evidence of pulmonary embolism. 2.  Grossly unchanged complete atelectasis/collapse of the right lower lobe.  Previously identified heterogeneously enhancing right- sided lung mass is not well demonstrated on the present examination secondary to the adjacent atelectatic lung.  3.  Interval development of a large amount of debris within the residua of the right mainstem bronchus with associated development of ill-defined nodular tree in bud opacities primarily within the contralateral left lower lobe worrisome for aspiration.  4.  Grossly unchanged osseous metastatic disease to the chest as detailed above.  5.  Chronic enlargement of the caliber of the main pulmonary artery which is nonspecific in the setting of complete collapse/atelectasis in the right lung but could be indicative of pulmonary arterial hypertension.   Original Report Authenticated By: Tacey Ruiz, MD   US Renal  06/18/2013   *RADIOLOGY REPORT*  Clinical Data: History of acute renal failure and urinary retention.  RENAL/URINARY TRACT ULTRASOUND COMPLETE  Comparison:  CT 06/18/2013.  Findings:  Right Kidney:  Right renal length is 10.8 cm.  Left Kidney:  Left renal length is 10.7 cm.  Examination of each kidney shows no evidence of hydronephrosis, solid or cystic mass, calculus, parenchymal loss, or parenchymal textural abnormality.  Bladder:  Urinary bladder is drained by a Foley catheter.  IMPRESSION: No renal abnormality is identified.   Original Report Authenticated By: Onalee Hua Call   Dg Chest Port 1 View  06/23/2013   CLINICAL DATA:  Right lung collapse.  EXAM: PORTABLE CHEST - 1 VIEW  COMPARISON:  June 21, 2013  FINDINGS: Complete collapse the right lung again noted. The heart and mediastinum is shifted to the right.  Diffuse bilateral infiltrate in the left lung may represent pneumonia or edema. No change from the prior study.  IMPRESSION: No interval  change.   Electronically Signed   By: Marlan Palau M.D.   On: 06/23/2013 11:59   Dg Chest Port 1 View  06/21/2013   CLINICAL DATA:  Lung collapse.  EXAM: PORTABLE CHEST - 1 VIEW  COMPARISON:  06/19/2013 chest x-ray. 06/12/2013 CT.  FINDINGS: Complete consolidation of the right hemithorax with mediastinal shift to the right. Obstructing lesion may be present.  Diffuse left lung airspace disease. Etiology indeterminate. This may represent infectious infiltrate. Pulmonary vascular congestion/ shunting of blood may partially contribute to this appearance. Interstitial spread of tumor not excluded.  Left-sided pleural effusion may be present. No gross left-sided pneumothorax.  Osseous metastatic disease.  IMPRESSION: Complete consolidation of the right hemithorax with mediastinal shift to the right. Obstructing lesion may be present.  Diffuse left lung airspace disease. Etiology indeterminate. This may represent infectious infiltrate. Pulmonary vascular congestion/ shunting of blood may partially contribute to this appearance. Interstitial spread of tumor not excluded.  Left-sided pleural effusion may be present.  Osseous metastatic disease.   Electronically Signed   By: Bridgett Larsson   On: 06/21/2013 10:47   Dg Chest Port 1 View  06/19/2013   *RADIOLOGY REPORT*  Clinical Data: Difficulty breathing.  PORTABLE CHEST - 1 VIEW  Comparison: 06/14/2013  Findings: Completeness opacification of the right hemithorax persists, consistent with right lung collapse.  Airspace disease in the left lung has progressed in the interval, compatible with worsening infection or edema. Imaged bony structures of the thorax are intact.  IMPRESSION: Worsening airspace disease in the left lung.   Original Report Authenticated By: Kennith Center, M.D.   Dg Chest Port 1 View  06/14/2013   *RADIOLOGY REPORT*  Clinical Data: Pneumonia.  History lung cancer  PORTABLE CHEST - 1 VIEW  Comparison: 06/12/2013  Findings: Complete collapse of the  right lung again noted and unchanged.  There is associated right pleural effusion.  The heart shifted to the right due to collapse of the right lung, unchanged  Left lower lobe infiltrate consistent with pneumonia is slightly more prominent.  No left effusion.  IMPRESSION: Complete collapse of the right lung with right effusion unchanged  Mild progression of left lower lobe pneumonia.   Original Report Authenticated By: Janeece Riggers, M.D.   Dg Chest Portable 1 View  06/12/2013   *RADIOLOGY REPORT*  Clinical Data: Weakness.  Lung cancer  PORTABLE CHEST - 1 VIEW  Comparison: 06/12/2013  Findings: There is a large right pleural effusion.  This is similar in volume to the previous exam.  There is complete opacification of the right hemithorax.  Nodularity in the left lower lobe is unchanged.  Diffuse bronchitic changes are identified.  There is a sclerotic metastasis identified within the proximal left humerus.  IMPRESSION:  1.  No change and large right pleural effusion with complete opacification of the right hemithorax. 2.  Left lung nodularity as before.   Original Report Authenticated By: Signa Kell, M.D.   Ir Fluoro Guide Cv Midline Picc Right  06/26/2013   *RADIOLOGY REPORT*  Clinical Data: Patient with history of end-stage lung carcinoma and previously placed right upper extremity PICC line by IV therapy which was unable to be advanced centrally.  Request is now made for PICC exchange and/or repositioning.  POWERED PICC LINE EXCHANGE WITH FLUOROSCOPIC  GUIDANCE  Fluoroscopy Time: 96 seconds  Contrast utilized:  10 ml of Omni 300  Complications:  None immediate  Technique / Findings:  The existing right upper extremity PICC entry site was prepped and draped in the normal sterile fashion.   The proximal segment of the existing PICC was then cut, a guide wire was placed and advanced to the peripheral aspect of the right subclavian vein.  As the wire could not be advanced more centrally, the existing PICC  line was exchanged for a peel-away sheath and a limited central right upper extremity venogram was performed in various obliquities  demonstrating occlusion of the mid/central aspect of the right subclavian vein with filling of several hypertrophied right supraclavicular venous collaterals with eventual opacification of a normal-appearing SVC.  As the PICC is only to be utilized for palliative purposes, the decision was made to leave a midline catheter. A guidewire was utilized for measurement purposes and a new 27 cm triple lumen powered PICC was placed with tip terminating within the peripheral aspect the right subclavian vein.  Fluoroscopy and spot chest radiograph confirmed appropriate catheter tip placement.  The catheter was then flushed, secured to the skin and covered with a sterile dressing.  IMPRESSION: 1.  Successful right arm Powered triple lumen midline PICC line placement with ultrasound and fluoroscopic guidance.  The catheter is ready for use.  2.  Obstruction of the mid/central aspect of the right subclavian vein with filling of hypertrophied supraclavicular venous collaterals and eventual opacification of an otherwise normal appearing SVC.  Read by: Jeananne Rama, P.A.-C   Original Report Authenticated By: Tacey Ruiz, MD    Labs:  Basic Metabolic Panel:  Recent Labs Lab 06/22/13 0400 06/23/13 0345 06/24/13 0340 06/25/13 0545 06/26/13 0330  NA 147* 147* 153* 150* 144  K 3.3* 3.9 3.8 3.5 3.3*  CL 112 113* 117* 111 105  CO2 21 25 25 28 28   GLUCOSE 113* 120* 98 114* 94  BUN 56* 59* 54* 44* 40*  CREATININE 1.72* 1.70* 1.46* 1.42* 1.31*  CALCIUM 7.8* 7.9* 7.7* 7.7* 7.6*   GFR Estimated Creatinine Clearance: 37.3 ml/min (by C-G formula based on Cr of 1.31).  CBC:  Recent Labs Lab 06/22/13 0400 06/23/13 0345 06/24/13 0340 06/25/13 0545 06/26/13 0330  WBC 16.7* 18.4* 13.5* 13.0* 12.8*  HGB 9.5* 9.5* 8.7* 9.2* 9.3*  HCT 28.6* 29.0* 26.9* 28.6* 28.6*  MCV 88.3 90.3 89.7  90.2 89.4  PLT 15* 10* 11* 12* 16*   Microbiology Recent Results (from the past 240 hour(s))  WOUND CULTURE     Status: None   Collection Time    06/18/13 11:34 AM      Result Value Range Status   Specimen Description HEAD   Final   Special Requests Immunocompromised   Final   Gram Stain     Final   Value: RARE WBC PRESENT,BOTH PMN AND MONONUCLEAR     NO SQUAMOUS EPITHELIAL CELLS SEEN     RARE GRAM POSITIVE COCCI     IN PAIRS     Performed at [facility]   Culture     Final   Value: FEW METHICILLIN RESISTANT STAPHYLOCOCCUS AUREUS     Note: RIFAMPIN AND GENTAMICIN SHOULD NOT BE USED AS SINGLE DRUGS FOR TREATMENT OF STAPH INFECTIONS. CRITICAL RESULT CALLED TO, READ BACK BY AND VERIFIED WITH: RUBY JOHNSON BY INGRAM A 06/21/13 11AM     Performed at [facility]   Report Status 06/21/2013 FINAL   Final   Organism ID, Bacteria METHICILLIN RESISTANT STAPHYLOCOCCUS AUREUS   Final    Time coordinating discharge: 35 minutes.  Signed:  RAMA,CHRISTINA  Pager [phone] [facility] 06/28/2013, 10:42 AM

## 2013-07-08 ENCOUNTER — Telehealth: Payer: Self-pay | Admitting: *Deleted

## 2013-07-08 ENCOUNTER — Telehealth: Payer: Self-pay | Admitting: Dietician

## 2013-07-08 ENCOUNTER — Ambulatory Visit: Payer: Managed Care, Other (non HMO)

## 2013-07-08 ENCOUNTER — Other Ambulatory Visit: Payer: Self-pay | Admitting: *Deleted

## 2013-07-08 NOTE — Telephone Encounter (Signed)
Called patient's home to check why she had missed injection appointment.  Family member stated that she had passed away on 02-20-2023.  Will pass this information on to the doctor.

## 2013-08-03 DEATH — deceased

## 2014-03-17 ENCOUNTER — Telehealth: Payer: Self-pay | Admitting: Internal Medicine

## 2014-03-17 NOTE — Telephone Encounter (Signed)
Faxed pt medical records to [place] fax [phone]

## 2014-05-27 ENCOUNTER — Other Ambulatory Visit: Payer: Self-pay | Admitting: Pharmacist

## 2014-12-29 IMAGING — CR DG CHEST 1V PORT
1 series · 1 of 1 positions shown · non-contrast
Comparison: 06/12/2013

CLINICAL DATA: Weakness.  Lung cancer

PORTABLE CHEST - 1 VIEW

[AP]
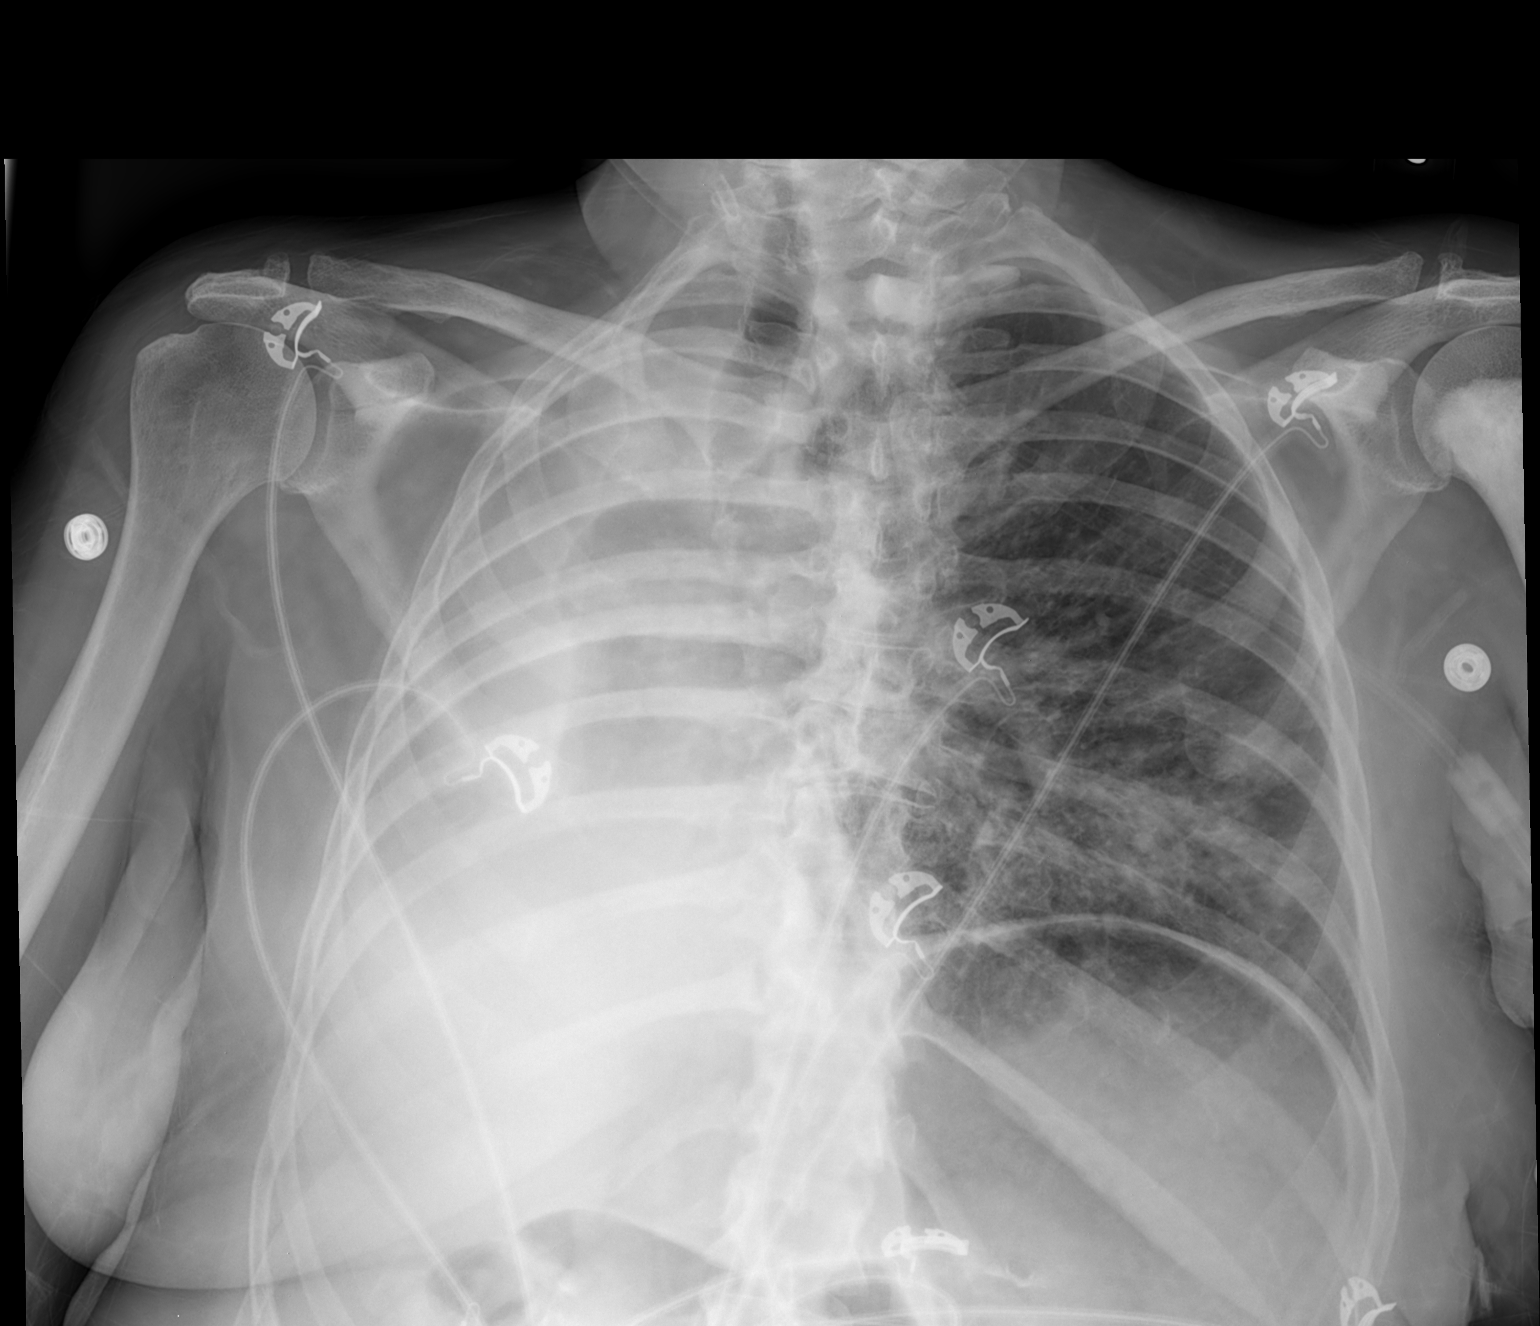

[1 of 1 positions shown; findings below may reference images not displayed]

FINDINGS: There is a large right pleural effusion.  This is similar
in volume to the previous exam.  There is complete opacification of
the right hemithorax.

Nodularity in the left lower lobe is unchanged.  Diffuse bronchitic
changes are identified.

There is a sclerotic metastasis identified within the proximal left
humerus.
IMPRESSION: 1.  No change and large right pleural effusion with complete
opacification of the right hemithorax.
2.  Left lung nodularity as before.

## 2014-12-29 IMAGING — CT CT ANGIO CHEST
1 of 2 series · 17 of 32 positions shown · IV contrast (OMNIPAQUE 300)
Comparison: CT of the chest, abdomen and pelvis - 05/13/2013; chest
CT - 08/24/2012; 03/09/2012

CLINICAL DATA: History of COPD and lung cancer with osseous
metastatic disease, now with shortness of breath, evaluate for
pulmonary embolism

CT ANGIOGRAPHY CHEST
TECHNIQUE: Multidetector CT imaging of the chest using the
standard protocol during bolus administration of intravenous
contrast. Multiplanar reconstructed images including MIPs were
obtained and reviewed to evaluate the vascular anatomy.
Contrast: 100mL OMNIPAQUE IOHEXOL 350 MG/ML SOLN

[Series 7: thins for pacs · axial · 0.71mm/px · z∈[-517,-317]mm · 17 of 224 slices shown]
[im 12/224  lung]
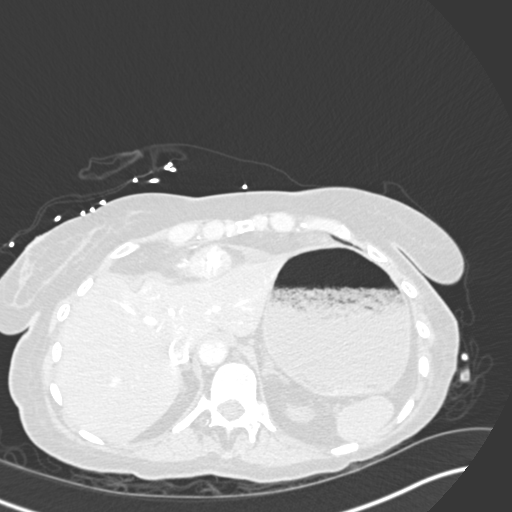
[im 23/224  mediastinal]
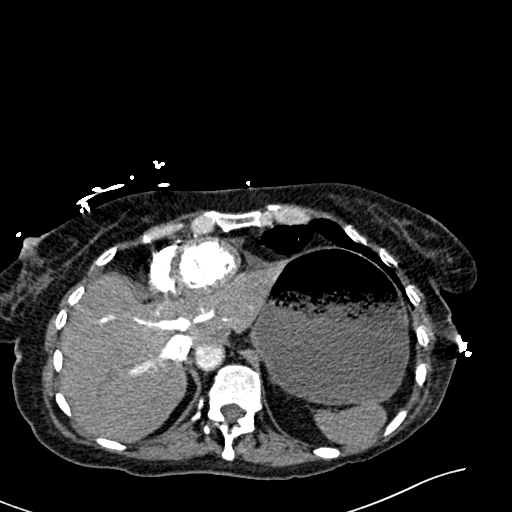
[im 45/224  lung]
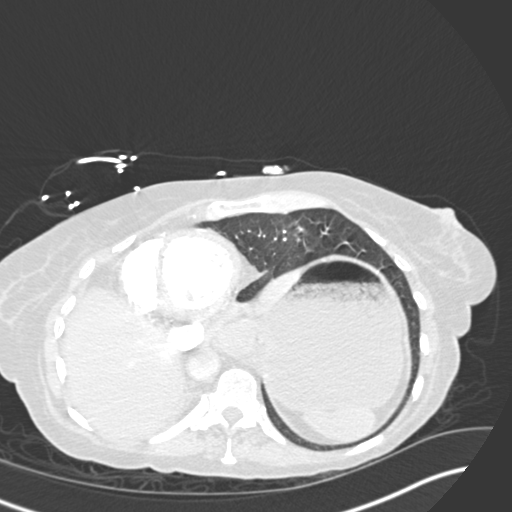
[im 56/224  mediastinal]
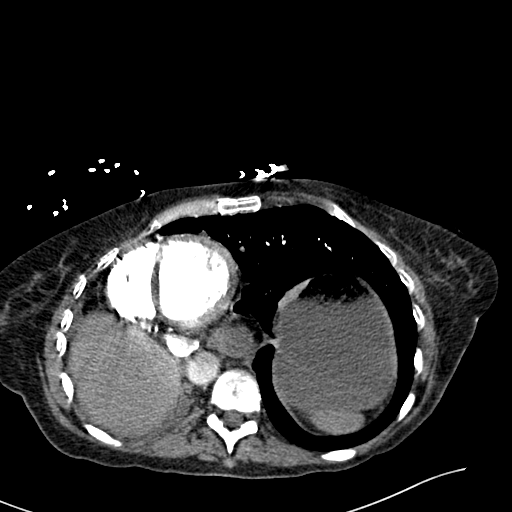
[im 67/224  lung]
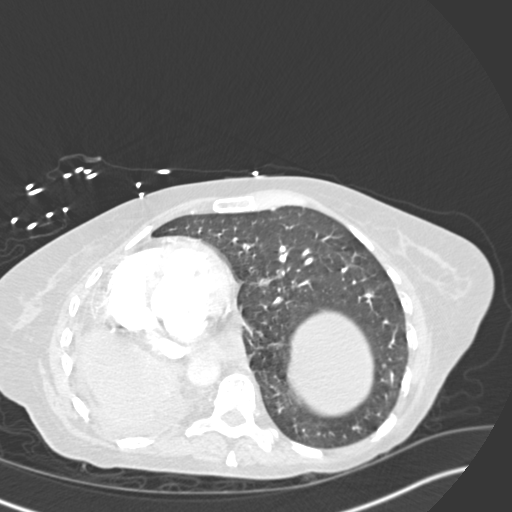
[im 79/224  mediastinal]
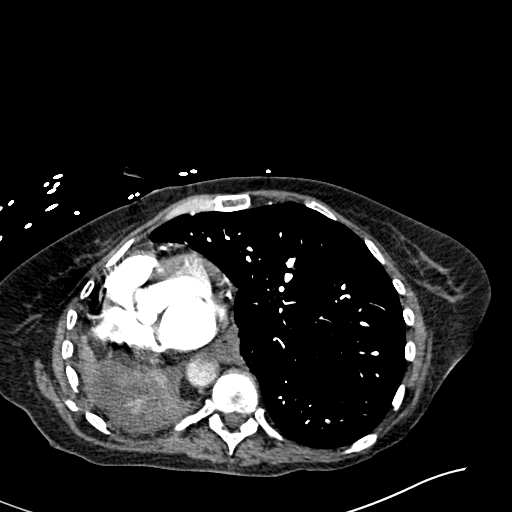
[im 90/224  lung]
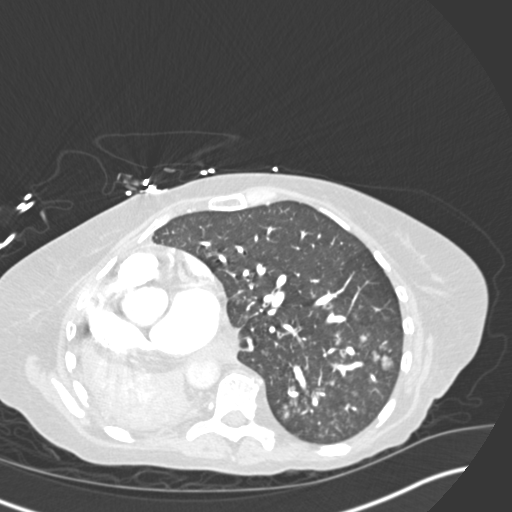
[im 101/224  mediastinal]
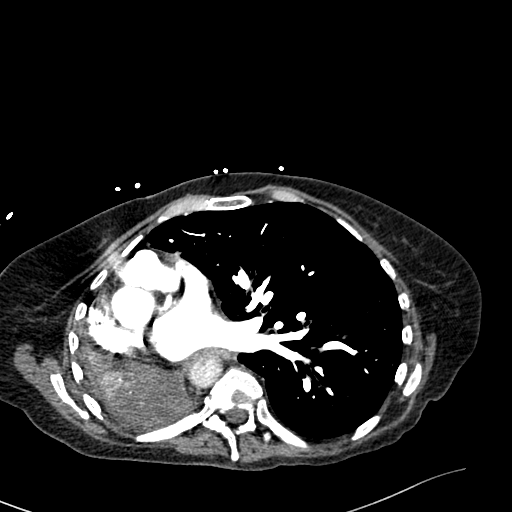
[im 112/224  lung]
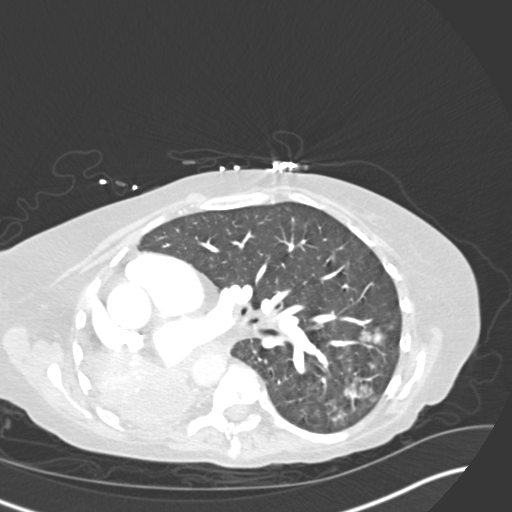
[im 123/224  mediastinal]
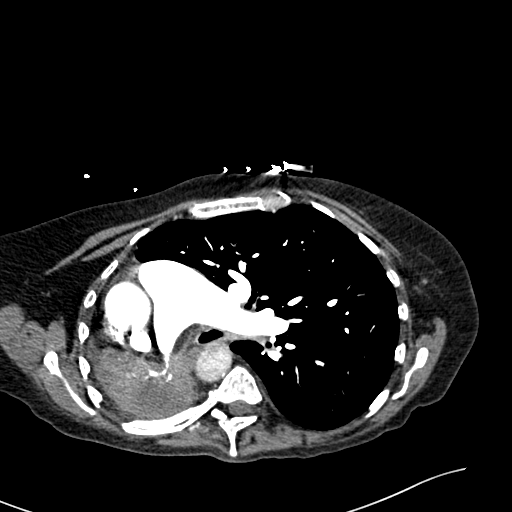
[im 134/224  lung]
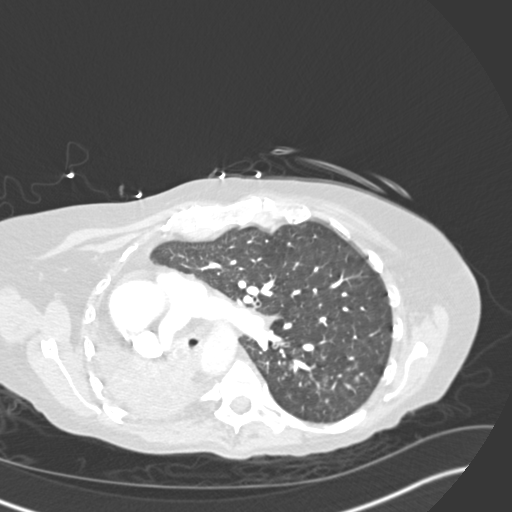
[im 145/224  mediastinal]
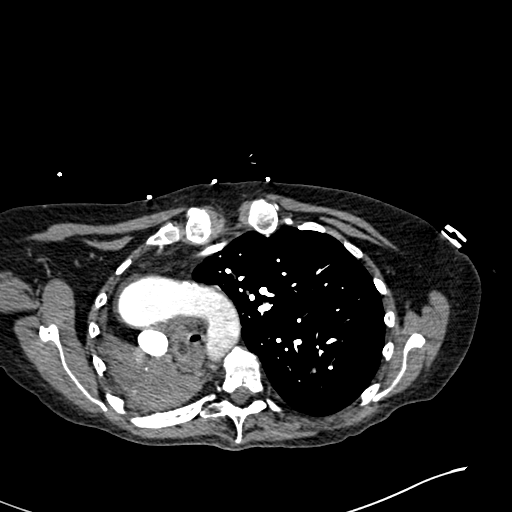
[im 157/224  lung]
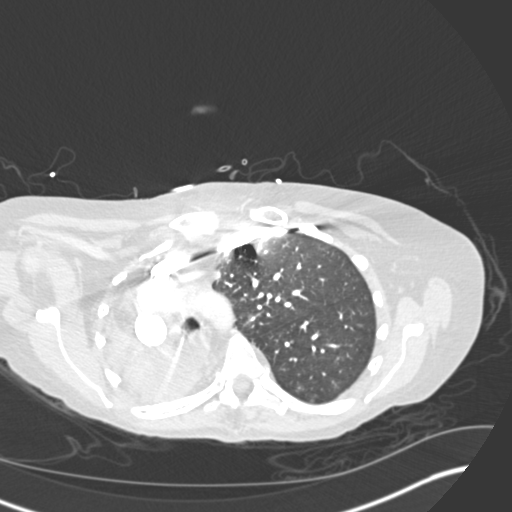
[im 168/224  mediastinal]
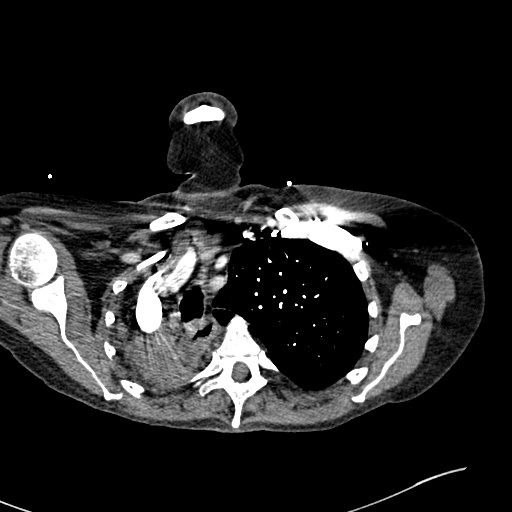
[im 179/224  lung]
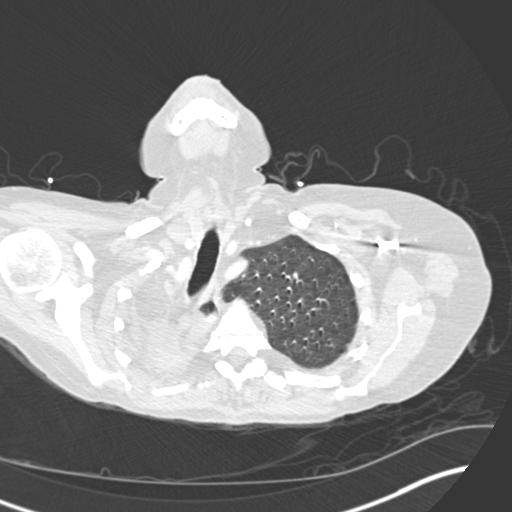
[im 201/224  mediastinal]
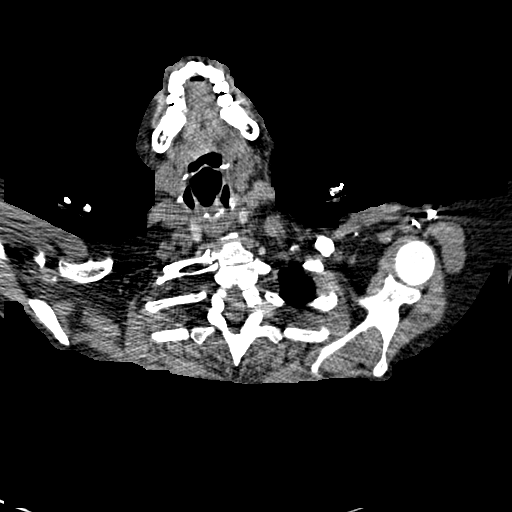
[im 212/224  lung]
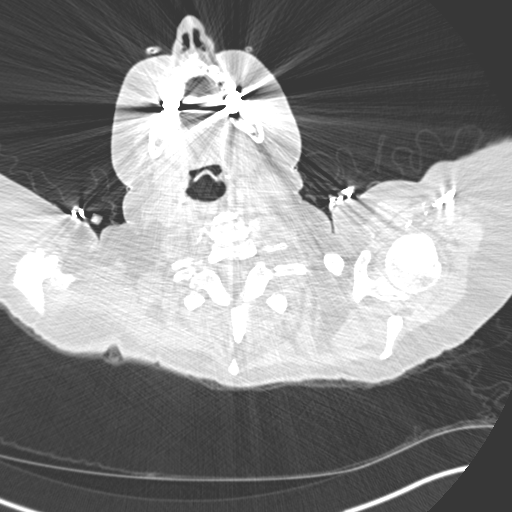

[17 of 32 positions shown; findings below may reference images not displayed]

FINDINGS: There is adequate opacification of the pulmonary arterial system of
the main pulmonary artery measuring 677 HU. There is expected rapid
tapering of the near occlusion of the right main pulmonary artery
due to chronic complete atelectasis/collapse of the right lung.
There are no discrete filling defects within the pulmonary arterial
tree to suggest pulmonary embolism.  The pulmonary artery remains
enlarged measuring 3.4 cm in diameter.

Normal heart size.  No definite pericardial effusion.  Normal
caliber thoracic aorta.  Possible bovine configuration of the
aortic arch.  No definite thoracic aortic dissection or periaortic
stranding.

---------------------------------------------------------

Nonvascular findings:

There is persistent findings of complete atelectasis/collapse of
the remaining right lung.  The previously described mass within the
atelectatic remaining right lung is difficult to definitively
measure on the present examination due to associated volume loss
and heterogeneous enhancement of the remaining pulmonary
parenchyma.  There is no discrete enhancing nodularity of the right
sided pleural. Grossly unchanged appearance of approximately 0.6 cm
left-sided distal periaortic lymph node (image 51, series five).
No new mediastinal or contralateral left hilar lymphadenopathy.

There is been interval development of debris within the residua of
the right mainstem bronchus (images 29 - 31, series eight).  This
finding is associated with development of ill-defined nodular
opacities within the aerated left lung, most conspicuously within
the left lower lobe many of which demonstrate a tree in bud
configuration (representative images 39 and 43, series eight).
While no discrete filling defects are seen within the left main
stem bronchi, these constellation of findings are worrisome for
aspiration.

Early arterial phase evaluation of the superior aspect of the
abdomen suggests a possible small hiatal hernia.

Redemonstrated extensive osseous metastatic disease to the thoracic
spine again most conspicuously involving the C5 and T2 vertebral
bodies as well as the right pedicle of T10 as well as the imaged
proximal aspect of the left humerus.  Grossly unchanged severe
compression deformity involving the posterior aspect of the T8
vertebral body.
IMPRESSION: 1.  No evidence of pulmonary embolism.
2.  Grossly unchanged complete atelectasis/collapse of the right
lower lobe.  Previously identified heterogeneously enhancing right-
sided lung mass is not well demonstrated on the present examination
secondary to the adjacent atelectatic lung.

3.  Interval development of a large amount of debris within the
residua of the right mainstem bronchus with associated development
of ill-defined nodular tree in bud opacities primarily within the
contralateral left lower lobe worrisome for aspiration.

4.  Grossly unchanged osseous metastatic disease to the chest as
detailed above.

5.  Chronic enlargement of the caliber of the main pulmonary artery
which is nonspecific in the setting of complete
collapse/atelectasis in the right lung but could be indicative of
pulmonary arterial hypertension.
